# Patient Record
Sex: Male | Born: 1945
Health system: Southern US, Community
[De-identification: ages and names within clinical notes are randomized; demographics above are authoritative.]

## PROBLEM LIST (undated history)

## (undated) DIAGNOSIS — E785 Hyperlipidemia, unspecified: Secondary | ICD-10-CM

## (undated) DIAGNOSIS — K219 Gastro-esophageal reflux disease without esophagitis: Secondary | ICD-10-CM

## (undated) DIAGNOSIS — N189 Chronic kidney disease, unspecified: Secondary | ICD-10-CM

## (undated) DIAGNOSIS — I10 Essential (primary) hypertension: Secondary | ICD-10-CM

## (undated) DIAGNOSIS — E669 Obesity, unspecified: Secondary | ICD-10-CM

## (undated) DIAGNOSIS — J449 Chronic obstructive pulmonary disease, unspecified: Secondary | ICD-10-CM

## (undated) HISTORY — DX: Gastro-esophageal reflux disease without esophagitis: K21.9

## (undated) HISTORY — DX: Chronic kidney disease, unspecified: N18.9

## (undated) HISTORY — DX: Hyperlipidemia, unspecified: E78.5

## (undated) HISTORY — DX: Obesity, unspecified: E66.9

## (undated) HISTORY — DX: Essential (primary) hypertension: I10

---
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
## 2000-12-03 ENCOUNTER — Encounter: Payer: Self-pay | Admitting: Gastroenterology

## 2002-05-23 ENCOUNTER — Encounter: Payer: Self-pay | Admitting: Emergency Medicine

## 2002-05-23 ENCOUNTER — Inpatient Hospital Stay (HOSPITAL_COMMUNITY): Admission: AC | Admit: 2002-05-23 | Discharge: 2002-05-25 | Payer: Self-pay

## 2004-09-26 ENCOUNTER — Ambulatory Visit: Payer: Self-pay | Admitting: Internal Medicine

## 2004-10-05 ENCOUNTER — Ambulatory Visit: Payer: Self-pay | Admitting: Internal Medicine

## 2005-10-01 ENCOUNTER — Ambulatory Visit: Payer: Self-pay | Admitting: Internal Medicine

## 2005-10-08 ENCOUNTER — Ambulatory Visit: Payer: Self-pay | Admitting: Internal Medicine

## 2006-10-08 ENCOUNTER — Ambulatory Visit: Payer: Self-pay | Admitting: Internal Medicine

## 2006-10-08 LAB — CONVERTED CEMR LAB
ALT: 23 units/L (ref 0–40)
AST: 22 units/L (ref 0–37)
Albumin: 3.8 g/dL (ref 3.5–5.2)
Alkaline Phosphatase: 83 units/L (ref 39–117)
BUN: 10 mg/dL (ref 6–23)
Basophils Absolute: 0 10*3/uL (ref 0.0–0.1)
Basophils Relative: 0.1 % (ref 0.0–1.0)
Bilirubin Urine: NEGATIVE
Bilirubin, Direct: 0.2 mg/dL (ref 0.0–0.3)
CO2: 31 meq/L (ref 19–32)
Calcium: 9 mg/dL (ref 8.4–10.5)
Chloride: 107 meq/L (ref 96–112)
Cholesterol: 139 mg/dL (ref 0–200)
Creatinine, Ser: 0.9 mg/dL (ref 0.4–1.5)
Eosinophils Absolute: 0.5 10*3/uL (ref 0.0–0.6)
Eosinophils Relative: 9 % — ABNORMAL HIGH (ref 0.0–5.0)
GFR calc Af Amer: 110 mL/min
GFR calc non Af Amer: 91 mL/min
Glucose, Bld: 113 mg/dL — ABNORMAL HIGH (ref 70–99)
HCT: 43.6 % (ref 39.0–52.0)
HDL: 47.3 mg/dL (ref >39.0)
Hemoglobin, Urine: NEGATIVE
Hemoglobin: 15.7 g/dL (ref 13.0–17.0)
Ketones, ur: NEGATIVE mg/dL
LDL Cholesterol: 80 mg/dL (ref 0–99)
Leukocytes, UA: NEGATIVE
Lymphocytes Relative: 35.9 % (ref 12.0–46.0)
MCHC: 36.1 g/dL — ABNORMAL HIGH (ref 30.0–36.0)
MCV: 92.6 fL (ref 78.0–100.0)
Monocytes Absolute: 0.5 10*3/uL (ref 0.2–0.7)
Monocytes Relative: 9.4 % (ref 3.0–11.0)
Neutro Abs: 2.5 10*3/uL (ref 1.4–7.7)
Neutrophils Relative %: 45.6 % (ref 43.0–77.0)
Nitrite: NEGATIVE
PSA: 0.9 ng/mL (ref 0.10–4.00)
Platelets: 255 10*3/uL (ref 150–400)
Potassium: 4.3 meq/L (ref 3.5–5.1)
RBC: 4.71 M/uL (ref 4.22–5.81)
RDW: 12.3 % (ref 11.5–14.6)
Sodium: 143 meq/L (ref 135–145)
Specific Gravity, Urine: >=1.03 (ref 1.000–1.03)
TSH: 1.33 microintl units/mL (ref 0.35–5.50)
Total Bilirubin: 0.8 mg/dL (ref 0.3–1.2)
Total CHOL/HDL Ratio: 2.9
Total Protein, Urine: NEGATIVE mg/dL
Total Protein: 6.7 g/dL (ref 6.0–8.3)
Triglycerides: 57 mg/dL (ref 0–149)
Urine Glucose: NEGATIVE mg/dL
Urobilinogen, UA: 0.2 (ref 0.0–1.0)
VLDL: 11 mg/dL (ref 0–40)
WBC: 5.4 10*3/uL (ref 4.5–10.5)
pH: 6 (ref 5.0–8.0)

## 2006-10-15 ENCOUNTER — Ambulatory Visit: Payer: Self-pay | Admitting: Internal Medicine

## 2007-10-27 ENCOUNTER — Ambulatory Visit: Payer: Self-pay | Admitting: Internal Medicine

## 2007-10-28 LAB — CONVERTED CEMR LAB
ALT: 28 units/L (ref 0–53)
AST: 28 units/L (ref 0–37)
Albumin: 3.9 g/dL (ref 3.5–5.2)
Alkaline Phosphatase: 81 units/L (ref 39–117)
BUN: 16 mg/dL (ref 6–23)
Basophils Absolute: 0 10*3/uL (ref 0.0–0.1)
Basophils Relative: 0.5 % (ref 0.0–1.0)
Bilirubin Urine: NEGATIVE
Bilirubin, Direct: 0.1 mg/dL (ref 0.0–0.3)
CO2: 27 meq/L (ref 19–32)
Calcium: 9.2 mg/dL (ref 8.4–10.5)
Chloride: 103 meq/L (ref 96–112)
Cholesterol: 166 mg/dL (ref 0–200)
Creatinine, Ser: 0.9 mg/dL (ref 0.4–1.5)
Eosinophils Absolute: 0.2 10*3/uL (ref 0.0–0.7)
Eosinophils Relative: 3.2 % (ref 0.0–5.0)
GFR calc Af Amer: 110 mL/min
GFR calc non Af Amer: 91 mL/min
Glucose, Bld: 123 mg/dL — ABNORMAL HIGH (ref 70–99)
HCT: 47.6 % (ref 39.0–52.0)
HDL: 59.7 mg/dL (ref >39.0)
Hemoglobin, Urine: NEGATIVE
Hemoglobin: 16 g/dL (ref 13.0–17.0)
Ketones, ur: NEGATIVE mg/dL
LDL Cholesterol: 93 mg/dL (ref 0–99)
Leukocytes, UA: NEGATIVE
Lymphocytes Relative: 23.5 % (ref 12.0–46.0)
MCHC: 33.6 g/dL (ref 30.0–36.0)
MCV: 90.1 fL (ref 78.0–100.0)
Monocytes Absolute: 0.7 10*3/uL (ref 0.1–1.0)
Monocytes Relative: 9.4 % (ref 3.0–12.0)
Neutro Abs: 4.6 10*3/uL (ref 1.4–7.7)
Neutrophils Relative %: 63.4 % (ref 43.0–77.0)
Nitrite: NEGATIVE
PSA: 0.75 ng/mL (ref 0.10–4.00)
Platelets: 246 10*3/uL (ref 150–400)
Potassium: 4.1 meq/L (ref 3.5–5.1)
RBC: 5.28 M/uL (ref 4.22–5.81)
RDW: 12.6 % (ref 11.5–14.6)
Sodium: 138 meq/L (ref 135–145)
Specific Gravity, Urine: 1.025 (ref 1.000–1.03)
TSH: 1.59 microintl units/mL (ref 0.35–5.50)
Total Bilirubin: 1.2 mg/dL (ref 0.3–1.2)
Total CHOL/HDL Ratio: 2.8
Total Protein, Urine: NEGATIVE mg/dL
Total Protein: 6.7 g/dL (ref 6.0–8.3)
Triglycerides: 65 mg/dL (ref 0–149)
Urine Glucose: NEGATIVE mg/dL
Urobilinogen, UA: 0.2 (ref 0.0–1.0)
VLDL: 13 mg/dL (ref 0–40)
WBC: 7.2 10*3/uL (ref 4.5–10.5)
pH: 6 (ref 5.0–8.0)

## 2007-11-02 ENCOUNTER — Ambulatory Visit: Payer: Self-pay | Admitting: Internal Medicine

## 2007-11-02 DIAGNOSIS — E785 Hyperlipidemia, unspecified: Secondary | ICD-10-CM | POA: Insufficient documentation

## 2007-11-02 DIAGNOSIS — K219 Gastro-esophageal reflux disease without esophagitis: Secondary | ICD-10-CM | POA: Insufficient documentation

## 2008-10-28 ENCOUNTER — Ambulatory Visit: Payer: Self-pay | Admitting: Internal Medicine

## 2008-10-28 LAB — CONVERTED CEMR LAB
ALT: 29 units/L (ref 0–53)
AST: 25 units/L (ref 0–37)
Albumin: 3.8 g/dL (ref 3.5–5.2)
Alkaline Phosphatase: 96 units/L (ref 39–117)
BUN: 16 mg/dL (ref 6–23)
Basophils Absolute: 0 10*3/uL (ref 0.0–0.1)
Basophils Relative: 0.5 % (ref 0.0–3.0)
Bilirubin Urine: NEGATIVE
Bilirubin, Direct: 0.2 mg/dL (ref 0.0–0.3)
CO2: 29 meq/L (ref 19–32)
Calcium: 9 mg/dL (ref 8.4–10.5)
Chloride: 108 meq/L (ref 96–112)
Cholesterol: 139 mg/dL (ref 0–200)
Creatinine, Ser: 0.8 mg/dL (ref 0.4–1.5)
Eosinophils Absolute: 0.3 10*3/uL (ref 0.0–0.7)
Eosinophils Relative: 5.4 % — ABNORMAL HIGH (ref 0.0–5.0)
GFR calc non Af Amer: 103.29 mL/min (ref >60)
Glucose, Bld: 119 mg/dL — ABNORMAL HIGH (ref 70–99)
HCT: 45.7 % (ref 39.0–52.0)
HDL: 68.1 mg/dL (ref >39.00)
Hemoglobin: 15.8 g/dL (ref 13.0–17.0)
Ketones, ur: NEGATIVE mg/dL
LDL Cholesterol: 60 mg/dL (ref 0–99)
Leukocytes, UA: NEGATIVE
Lymphocytes Relative: 29.8 % (ref 12.0–46.0)
Lymphs Abs: 1.7 10*3/uL (ref 0.7–4.0)
MCHC: 34.6 g/dL (ref 30.0–36.0)
MCV: 89.1 fL (ref 78.0–100.0)
Monocytes Absolute: 0.6 10*3/uL (ref 0.1–1.0)
Monocytes Relative: 10.7 % (ref 3.0–12.0)
Neutro Abs: 3.2 10*3/uL (ref 1.4–7.7)
Neutrophils Relative %: 53.6 % (ref 43.0–77.0)
Nitrite: NEGATIVE
PSA: 0.93 ng/mL (ref 0.10–4.00)
Platelets: 222 10*3/uL (ref 150.0–400.0)
Potassium: 4.1 meq/L (ref 3.5–5.1)
RBC: 5.13 M/uL (ref 4.22–5.81)
RDW: 12.4 % (ref 11.5–14.6)
Sodium: 141 meq/L (ref 135–145)
Specific Gravity, Urine: 1.025 (ref 1.000–1.030)
TSH: 1.6 microintl units/mL (ref 0.35–5.50)
Total Bilirubin: 0.8 mg/dL (ref 0.3–1.2)
Total CHOL/HDL Ratio: 2
Total Protein, Urine: NEGATIVE mg/dL
Total Protein: 6.6 g/dL (ref 6.0–8.3)
Triglycerides: 55 mg/dL (ref 0.0–149.0)
Urine Glucose: NEGATIVE mg/dL
Urobilinogen, UA: 0.2 (ref 0.0–1.0)
VLDL: 11 mg/dL (ref 0.0–40.0)
WBC: 5.8 10*3/uL (ref 4.5–10.5)
pH: 6 (ref 5.0–8.0)

## 2008-11-08 ENCOUNTER — Ambulatory Visit: Payer: Self-pay | Admitting: Internal Medicine

## 2008-12-13 ENCOUNTER — Telehealth: Payer: Self-pay | Admitting: Internal Medicine

## 2008-12-23 ENCOUNTER — Telehealth: Payer: Self-pay | Admitting: Internal Medicine

## 2008-12-23 ENCOUNTER — Ambulatory Visit: Payer: Self-pay | Admitting: Internal Medicine

## 2008-12-23 DIAGNOSIS — R21 Rash and other nonspecific skin eruption: Secondary | ICD-10-CM | POA: Insufficient documentation

## 2009-11-07 ENCOUNTER — Ambulatory Visit: Payer: Self-pay | Admitting: Internal Medicine

## 2009-11-07 LAB — CONVERTED CEMR LAB
ALT: 33 units/L (ref 0–53)
AST: 36 units/L (ref 0–37)
Albumin: 3.8 g/dL (ref 3.5–5.2)
Alkaline Phosphatase: 87 units/L (ref 39–117)
BUN: 16 mg/dL (ref 6–23)
Basophils Absolute: 0.1 10*3/uL (ref 0.0–0.1)
Basophils Relative: 0.9 % (ref 0.0–3.0)
Bilirubin Urine: NEGATIVE
Bilirubin, Direct: 0.1 mg/dL (ref 0.0–0.3)
CO2: 30 meq/L (ref 19–32)
Calcium: 8.9 mg/dL (ref 8.4–10.5)
Chloride: 107 meq/L (ref 96–112)
Cholesterol: 145 mg/dL (ref 0–200)
Creatinine, Ser: 0.8 mg/dL (ref 0.4–1.5)
Eosinophils Absolute: 0.4 10*3/uL (ref 0.0–0.7)
Eosinophils Relative: 6.4 % — ABNORMAL HIGH (ref 0.0–5.0)
GFR calc non Af Amer: 106.01 mL/min (ref >60)
Glucose, Bld: 97 mg/dL (ref 70–99)
HCT: 46.9 % (ref 39.0–52.0)
HDL: 69.3 mg/dL (ref >39.00)
Hemoglobin: 15.9 g/dL (ref 13.0–17.0)
Ketones, ur: NEGATIVE mg/dL
LDL Cholesterol: 64 mg/dL (ref 0–99)
Leukocytes, UA: NEGATIVE
Lymphocytes Relative: 26.4 % (ref 12.0–46.0)
Lymphs Abs: 1.6 10*3/uL (ref 0.7–4.0)
MCHC: 34 g/dL (ref 30.0–36.0)
MCV: 90.1 fL (ref 78.0–100.0)
Monocytes Absolute: 0.7 10*3/uL (ref 0.1–1.0)
Monocytes Relative: 11.6 % (ref 3.0–12.0)
Neutro Abs: 3.4 10*3/uL (ref 1.4–7.7)
Neutrophils Relative %: 54.7 % (ref 43.0–77.0)
Nitrite: NEGATIVE
PSA: 1.01 ng/mL (ref 0.10–4.00)
Platelets: 244 10*3/uL (ref 150.0–400.0)
Potassium: 4.6 meq/L (ref 3.5–5.1)
RBC: 5.21 M/uL (ref 4.22–5.81)
RDW: 13.5 % (ref 11.5–14.6)
Sodium: 142 meq/L (ref 135–145)
Specific Gravity, Urine: >=1.03 (ref 1.000–1.030)
TSH: 1.51 microintl units/mL (ref 0.35–5.50)
Total Bilirubin: 0.7 mg/dL (ref 0.3–1.2)
Total CHOL/HDL Ratio: 2
Total Protein, Urine: NEGATIVE mg/dL
Total Protein: 6.4 g/dL (ref 6.0–8.3)
Triglycerides: 60 mg/dL (ref 0.0–149.0)
Urine Glucose: NEGATIVE mg/dL
Urobilinogen, UA: 0.2 (ref 0.0–1.0)
VLDL: 12 mg/dL (ref 0.0–40.0)
WBC: 6.2 10*3/uL (ref 4.5–10.5)
pH: 6 (ref 5.0–8.0)

## 2009-11-13 ENCOUNTER — Ambulatory Visit: Payer: Self-pay | Admitting: Internal Medicine

## 2009-11-13 DIAGNOSIS — R0602 Shortness of breath: Secondary | ICD-10-CM | POA: Insufficient documentation

## 2009-11-20 ENCOUNTER — Ambulatory Visit: Payer: Self-pay | Admitting: Internal Medicine

## 2009-11-21 ENCOUNTER — Encounter: Payer: Self-pay | Admitting: Internal Medicine

## 2009-11-30 ENCOUNTER — Telehealth: Payer: Self-pay | Admitting: Gastroenterology

## 2009-12-29 ENCOUNTER — Telehealth: Payer: Self-pay | Admitting: Internal Medicine

## 2010-06-28 NOTE — Procedures (Signed)
Summary: COLONOSCOPY   Colonoscopy  Procedure date:  12/03/2000  Findings:      Location:  [facility].     Patient Name: [name], [name] MRN:  Procedure Procedures: Colonoscopy CPT: 78295.  Personnel: Endoscopist: Barbette Hair. Arlyce Dice, MD.  Referred By: Linda Hedges Plotnikov, MD.  Exam Location: Exam performed in Outpatient Clinic. Outpatient  Patient Consent: Procedure, Alternatives, Risks and Benefits discussed, consent obtained, from patient.  Indications Symptoms: Hematochezia.  History  Pre-Exam Physical: Performed Dec 03, 2000. Cardio-pulmonary exam, Rectal exam, HEENT exam , Abdominal exam, Extremity exam, Neurological exam, Mental status exam WNL.  Exam Exam: Extent of exam reached: Cecum, extent intended: Cecum.  ASA Classification: II. Tolerance: good.  Monitoring: Pulse and BP monitoring, Oximetry used. Supplemental O2 given.  Colon Prep Used Fleets Prep Kit for colon prep. Prep results: fair, adequate exam.  Sedation Meds: Patient assessed and found to be appropriate for moderate (conscious) sedation. Fentanyl 100 mcg. Versed 5 mg.  Findings HEMORRHOIDS: Internal. ICD9: Hemorrhoids, Internal: 455.0.   Assessment Abnormal examination, see findings above.  Diagnoses: 455.0: Hemorrhoids, Internal.   Events  Unplanned Interventions: No intervention was required.  Unplanned Events: There were no complications. Plans Medication Plan: Hemorrhoidal Medications: Anusol Suppositories 1 pr prn, starting Dec 03, 2000   Disposition: After procedure patient sent to recovery. After recovery patient sent home.  Scheduling/Referral: Follow-Up prn.    CC: Linda Hedges.Plotnikov,MD  This report was created from the original endoscopy report, which was reviewed and signed by the above listed endoscopist.

## 2010-06-28 NOTE — Progress Notes (Signed)
Summary: recall COL?  Phone Note Call from Patient Call back at Work Phone [phone]   Caller: Patient Call For: Dr. Arlyce Dice Reason for Call: Talk to Nurse Summary of Call: would like to know when he should have recall COL... last COL was July 2002... not having any GI concerns, issues, complaints Initial call taken by: Vallarie Mare,  November 30, 2009 9:33 AM  Follow-up for Phone Call        DR.KAPLAN-Please advise. Follow-up by: Laureen Ochs LPN,  November 30, 1476 10:01 AM  Additional Follow-up for Phone Call Additional follow up Details #1::        colo in 1 year Additional Follow-up by: Louis Meckel MD,  November 30, 2009 10:17 AM    Additional Follow-up for Phone Call Additional follow up Details #2::    Message left for pt. with above MD instructions. Recall is in IDX for 11/2010. Pt. instructed to call back as needed.  Follow-up by: Laureen Ochs LPN,  December 01, 2954 10:31 AM

## 2010-06-28 NOTE — Miscellaneous (Signed)
Summary: colon  Clinical Lists Changes  Orders: Added new Referral order of Gastroenterology Referral (GI) - Signed

## 2010-06-28 NOTE — Progress Notes (Signed)
Summary: Sleeping Med  Phone Note Call from Patient Call back at [facility] Phone [phone] Call back at [phone]   Caller: Wife Summary of Call: Patient is requesting rx for "light" sleeping pill. He is having trouble sleeping.  Initial call taken by: Lamar Sprinkles, CMA,  December 29, 2009 10:47 AM  Follow-up for Phone Call        Try OTC Valerian root 1 at hs prn Follow-up by: Tresa Garter MD,  January 01, 2010 8:03 AM  Additional Follow-up for Phone Call Additional follow up Details #1::        left mess to call office back....................Marland KitchenLamar Sprinkles, CMA  January 01, 2010 2:24 PM   Pt informed  Additional Follow-up by: Lamar Sprinkles, CMA,  January 02, 2010 5:21 PM

## 2010-06-28 NOTE — Assessment & Plan Note (Signed)
Summary: per ami sched--avp--zostavax-stc  Nurse Visit   Allergies: No Known Drug Allergies  Immunizations Administered:  Zostavax # 1:    Vaccine Type: Zostavax    Site: left arm    Mfr: Merck    Dose: 0.65    Route: Joffre    Given by: Lamar Sprinkles, CMA    Exp. Date: 11/03/2010    Lot #: [id number]    VIS given: 03/08/05 given November 20, 2009.  Orders Added: 1)  Zoster (Shingles) Vaccine Live [90736] 2)  Admin 1st Vaccine [phone]

## 2010-06-28 NOTE — Assessment & Plan Note (Signed)
Summary: CPX / NWS  #   Vital Signs:  Patient profile:   65 year old male Height:      64 inches (162.56 cm) Weight:      185 pounds (84.09 kg) BMI:     31.87 O2 Sat:      94 % on Room air Temp:     97.4 degrees F (36.33 degrees C) oral Pulse rate:   72 / minute BP sitting:   150 / 78  (left arm) Cuff size:   regular  Vitals Entered By: Lucious Groves (November 13, 2009 3:01 PM)  O2 Flow:  Room air CC: CPX./kb Is Patient Diabetic? No Pain Assessment Patient in pain? no      Comments Patient notes that he is not taking Acetazolamide, Lotrisone, or Prednisone./kb   CC:  CPX./kb.  History of Present Illness: The patient presents for a wellness examination  BP OK at home C/o SOB going up the hill x long time  Preventive Screening-Counseling & Management  Caffeine-Diet-Exercise     Does Patient Exercise: yes  Current Medications (verified): 1)  Lipitor 20 Mg  Tabs (Atorvastatin Calcium) .... Once Daily 2)  Nexium 40 Mg  Cpdr (Esomeprazole Magnesium) .... Once Daily 3)  Folic Acid 1 Mg  Tabs (Folic Acid) .... 2 Po Once Daily 4)  Aspirin 81 Mg  Tbec (Aspirin) .... One By Mouth Every Day 5)  Vitamin D 1000 Unit Tabs (Cholecalciferol) .... Once Daily 6)  Multivitamins  Tabs (Multiple Vitamin) .... Once Daily 7)  Acetazolamide 250 Mg Tabs (Acetazolamide) .[name].. 1 By Mouth Two Times A Day X 9 D 8)  Lotrisone 1-0.05 % Crea (Clotrimazole-Betamethasone) .... Use Asd Two Times A Day As Needed 9)  Prednisone 10 Mg Tabs (Prednisone) .... 3po Qd For 3days, Then 2po Qd For 3days, Then 1po Qd For 3days, Then Stop  Allergies (verified): No Known Drug Allergies  Past History:  Past Medical History: Last updated: 11/02/2007 GERD Hyperlipidemia Colon 2002  Dr Arlyce Dice  Family History: Last updated: 11/02/2007 Family History Hypertension CAD  Social History: Last updated: 11/13/2009 Occupation: Married Former Smoker Regular exercise-yes: riding  Past Surgical History: Denies  surgical history  Social History: Occupation: Married Former Smoker Regular exercise-yes: riding Does Patient Exercise:  yes  Review of Systems       The patient complains of dyspnea on exertion.  The patient denies anorexia, fever, weight loss, weight gain, vision loss, decreased hearing, hoarseness, chest pain, syncope, peripheral edema, prolonged cough, headaches, hemoptysis, abdominal pain, melena, hematochezia, severe indigestion/heartburn, hematuria, incontinence, genital sores, muscle weakness, suspicious skin lesions, transient blindness, difficulty walking, depression, unusual weight change, abnormal bleeding, enlarged lymph nodes, angioedema, and testicular masses.         Mild DOE  Physical Exam  General:  alert and overweight-appearing.   Head:  normocephalic and atraumatic.   Eyes:  vision grossly intact, pupils equal, and pupils round.   Ears:  R ear normal and L ear normal.   Nose:  no external deformity and no nasal discharge.   Mouth:  no gingival abnormalities and pharynx pink and moist.   Neck:  supple and no masses.   Chest Wall:  No deformities, masses, tenderness or gynecomastia noted. Breasts:  No masses or gynecomastia noted Lungs:  Normal respiratory effort, chest expands symmetrically. Lungs are clear to auscultation, no crackles or wheezes. Heart:  Normal rate and regular rhythm. S1 and S2 normal without gallop, murmur, click, rub or other extra sounds. Abdomen:  Bowel sounds positive,abdomen soft and non-tender without masses, organomegaly or hernias noted. Rectal:  No external abnormalities noted. Normal sphincter tone. No rectal masses or tenderness. Genitalia:  Testes bilaterally descended without nodularity, tenderness or masses. No scrotal masses or lesions. No penis lesions or urethral discharge. Prostate:  Prostate gland firm and smooth, no enlargement, nodularity, tenderness, mass, asymmetry or induration. Msk:  No deformity or scoliosis noted of  thoracic or lumbar spine.   Pulses:  R and L carotid,radial,femoral,dorsalis pedis and posterior tibial pulses are full and equal bilaterally Extremities:  No clubbing, cyanosis, edema, or deformity noted with normal full range of motion of all joints.   Neurologic:  No cranial nerve deficits noted. Station and gait are normal. Plantar reflexes are down-going bilaterally. DTRs are symmetrical throughout. Sensory, motor and coordinative functions appear intact. Skin:  Intact without suspicious lesions or rashes Cervical Nodes:  No lymphadenopathy noted Inguinal Nodes:  No significant adenopathy Psych:  Cognition and judgment appear intact. Alert and cooperative with normal attention span and concentration. No apparent delusions, illusions, hallucinations   Impression & Recommendations:  Problem # 1:  WELL ADULT EXAM (ICD-V70.0) Assessment New Health and age related issues were discussed. Available screening tests and vaccinations were discussed as well. Healthy life style including good diet and execise was discussed.  The labs were reviewed with the patient.  Orders: EKG w/ Interpretation (93000) TD Toxoids IM 7 YR + (40981) Pneumococcal Vaccine (19147) Admin 1st Vaccine (82956) Admin of Any Addtl Vaccine (21308) Admin 1st Vaccine (State) [phone] Admin of Any Addtl Vaccine (State) (90472S) T-2 View CXR, Same Day (71020.5TC) Zostavax Info  Problem # 2:  DYSPNEA (ICD-786.05) poss COPD related Try MDI Orders: T-2 View CXR, Same Day (71020.5TC)  Problem # 3:  HYPERLIPIDEMIA (ICD-272.4) Assessment: Improved  His updated medication list for this problem includes:    Lipitor 20 Mg Tabs (Atorvastatin calcium) ..... Once daily  Problem # 4:  OTHER ABNORMAL GLUCOSE (ICD-790.29) Assessment: Unchanged The labs were reviewed with the patient.  See "Patient Instructions".   Complete Medication List: 1)  Lipitor 20 Mg Tabs (Atorvastatin calcium) .... Once daily 2)  Nexium 40 Mg Cpdr  (Esomeprazole magnesium) .... Once daily 3)  Folic Acid 1 Mg Tabs (Folic acid) .... 2 po once daily 4)  Aspirin 81 Mg Tbec (Aspirin) .... One by mouth every day 5)  Vitamin D 1000 Unit Tabs (Cholecalciferol) .... Once daily 6)  Symbicort 80-4.5 Mcg/act Aero (Budesonide-formoterol fumarate) .[name].. 1-2 inh two times a day  Patient Instructions: 1)  Please schedule a follow-up appointment in 1 year well w/labs. 2)  It is important that you exercise regularly at least 20 minutes 5 times a week. If you develop chest pain, have severe difficulty breathing, or feel very tired , stop exercising immediately and seek medical attention. 3)  You need to lose weight. Consider a lower calorie diet and regular exercise.  Prescriptions: SYMBICORT 80-4.5 MCG/ACT AERO (BUDESONIDE-FORMOTEROL FUMARATE) 1-2 inh two times a day  #1 x 12   Entered and Authorized by:   Tresa Garter MD   Signed by:   Tresa Garter MD on 11/13/2009   Method used:   Print then Give to Patient   RxID:   [id number] FOLIC ACID 1 MG  TABS (FOLIC ACID) 2 po once daily  #60 x 11   Entered and Authorized by:   Tresa Garter MD   Signed by:   Tresa Garter MD on 11/13/2009   Method  used:   Print then Give to Patient   RxID:   [id number] NEXIUM 40 MG  CPDR (ESOMEPRAZOLE MAGNESIUM) once daily  #30 x 11   Entered and Authorized by:   Tresa Garter MD   Signed by:   Tresa Garter MD on 11/13/2009   Method used:   Print then Give to Patient   RxID:   [id number] LIPITOR 20 MG  TABS (ATORVASTATIN CALCIUM) once daily  #30 x 11   Entered and Authorized by:   Tresa Garter MD   Signed by:   Tresa Garter MD on 11/13/2009   Method used:   Print then Give to Patient   RxID:   [id number] SYMBICORT 80-4.5 MCG/ACT AERO (BUDESONIDE-FORMOTEROL FUMARATE) 1-2 inh two times a day  #1 x 12   Entered and Authorized by:   Tresa Garter MD   Signed by:   Tresa Garter MD on  11/13/2009   Method used:   Print then Give to Patient   RxID:   [id number]    Tetanus/Td Vaccine    Vaccine Type: Td    Site: left deltoid    Mfr: Sanofi Pasteur    Dose: 0.5 ml    Route: IM    Given by: Lucious Groves    Exp. Date: 06/28/2011    Lot #: [id number]    VIS given: 04/14/07 version given November 13, 2009.  Pneumovax Vaccine    Vaccine Type: Pneumovax    Site: left deltoid    Mfr: Merck    Dose: 0.5 ml    Route: IM    Given by: Lucious Groves    Exp. Date: 03/21/2011    Lot #: [id number]    VIS given: 12/23/95 version given November 13, 2009.

## 2010-08-29 ENCOUNTER — Other Ambulatory Visit (INDEPENDENT_AMBULATORY_CARE_PROVIDER_SITE_OTHER): Payer: 59

## 2010-08-29 DIAGNOSIS — Z0000 Encounter for general adult medical examination without abnormal findings: Secondary | ICD-10-CM

## 2010-08-29 DIAGNOSIS — Z0389 Encounter for observation for other suspected diseases and conditions ruled out: Secondary | ICD-10-CM

## 2010-08-29 LAB — BASIC METABOLIC PANEL
BUN: 16 mg/dL (ref 6–23)
CO2: 29 mEq/L (ref 19–32)
Chloride: 103 mEq/L (ref 96–112)
Glucose, Bld: 116 mg/dL — ABNORMAL HIGH (ref 70–99)
Potassium: 4.8 mEq/L (ref 3.5–5.1)

## 2010-08-29 LAB — URINALYSIS, ROUTINE W REFLEX MICROSCOPIC
Bilirubin Urine: NEGATIVE
Ketones, ur: NEGATIVE
Leukocytes, UA: NEGATIVE
Nitrite: NEGATIVE
Specific Gravity, Urine: 1.025 (ref 1.000–1.030)
Urobilinogen, UA: 0.2 (ref 0.0–1.0)
pH: 6 (ref 5.0–8.0)

## 2010-08-29 LAB — CBC WITH DIFFERENTIAL/PLATELET
Basophils Relative: 0.5 % (ref 0.0–3.0)
Eosinophils Relative: 5.1 % — ABNORMAL HIGH (ref 0.0–5.0)
Hemoglobin: 15.9 g/dL (ref 13.0–17.0)
Lymphocytes Relative: 25.8 % (ref 12.0–46.0)
MCV: 93 fl (ref 78.0–100.0)
Neutro Abs: 4.1 10*3/uL (ref 1.4–7.7)
Neutrophils Relative %: 59.4 % (ref 43.0–77.0)
RBC: 4.89 Mil/uL (ref 4.22–5.81)
WBC: 7 10*3/uL (ref 4.5–10.5)

## 2010-08-29 LAB — HEPATIC FUNCTION PANEL
ALT: 25 U/L (ref 0–53)
Bilirubin, Direct: 0.2 mg/dL (ref 0.0–0.3)
Total Bilirubin: 1.2 mg/dL (ref 0.3–1.2)
Total Protein: 6.3 g/dL (ref 6.0–8.3)

## 2010-08-29 LAB — TSH: TSH: 1.3 u[IU]/mL (ref 0.35–5.50)

## 2010-08-29 LAB — LIPID PANEL
LDL Cholesterol: 64 mg/dL (ref 0–99)
Total CHOL/HDL Ratio: 2
VLDL: 11.6 mg/dL (ref 0.0–40.0)

## 2010-08-29 LAB — PSA: PSA: 1.33 ng/mL (ref 0.10–4.00)

## 2010-09-05 ENCOUNTER — Encounter: Payer: Self-pay | Admitting: Internal Medicine

## 2010-09-05 ENCOUNTER — Ambulatory Visit (INDEPENDENT_AMBULATORY_CARE_PROVIDER_SITE_OTHER): Payer: 59 | Admitting: Internal Medicine

## 2010-09-05 VITALS — BP 150/90 | HR 84 | Temp 98.3°F | Resp 16 | Ht 63.0 in | Wt 184.0 lb

## 2010-09-05 DIAGNOSIS — Z0000 Encounter for general adult medical examination without abnormal findings: Secondary | ICD-10-CM

## 2010-09-05 MED ORDER — ATORVASTATIN CALCIUM 20 MG PO TABS: 20.0000 mg | ORAL_TABLET | Freq: Every day | ORAL | Status: DC

## 2010-09-05 MED ORDER — DOXYCYCLINE HYCLATE 50 MG PO CAPS: 50.0000 mg | ORAL_CAPSULE | Freq: Two times a day (BID) | ORAL | Status: AC

## 2010-09-05 MED ORDER — TRIAMCINOLONE ACETONIDE 0.5 % EX CREA: TOPICAL_CREAM | Freq: Three times a day (TID) | CUTANEOUS | Status: DC

## 2010-09-05 MED ORDER — FOLIC ACID 1 MG PO TABS: 2.0000 mg | ORAL_TABLET | Freq: Every day | ORAL | Status: DC

## 2010-09-05 MED ORDER — ALBUTEROL SULFATE HFA 108 (90 BASE) MCG/ACT IN AERS: 2.0000 | INHALATION_SPRAY | Freq: Four times a day (QID) | RESPIRATORY_TRACT | Status: DC | PRN

## 2010-09-05 MED ORDER — ESOMEPRAZOLE MAGNESIUM 40 MG PO CPDR: 40.0000 mg | DELAYED_RELEASE_CAPSULE | Freq: Every day | ORAL | Status: DC

## 2010-09-05 NOTE — Progress Notes (Signed)
Subjective:    Patient ID: [name], male    DOB: 06-05-1945, 65 y.o.   MRN: [id number]  HPI  The patient is here for a wellness exam. The patient has been doing well overall without major physical or psychological issues going on lately. The patient needs to address her chronic hypertension that has been well controlled with medicines; to address chronic  hyperlipidemia controlled with medicines as well; and to address GERD, controlled with medical treatment and diet. C/o 2 itchy spots on penis appeared after riding 1 wk ago.   Review of Systems  Constitutional: Negative for appetite change, fatigue and unexpected weight change.  HENT: Negative for nosebleeds, congestion, sore throat, sneezing, trouble swallowing and neck pain.   Eyes: Negative for itching and visual disturbance.  Respiratory: Negative for cough.   Cardiovascular: Negative for chest pain, palpitations and leg swelling.  Gastrointestinal: Negative for nausea, diarrhea, blood in stool and abdominal distention.  Genitourinary: Negative for frequency and hematuria.  Musculoskeletal: Negative for back pain, joint swelling and gait problem.  Skin: Negative for rash.  Neurological: Negative for dizziness, tremors, speech difficulty and weakness.  Psychiatric/Behavioral: Negative for sleep disturbance, dysphoric mood and agitation. The patient is not nervous/anxious.        Objective:   Physical Exam  Constitutional: He is oriented to person, place, and time. He appears well-developed and well-nourished. No distress.  HENT:  Head: Normocephalic and atraumatic.  Right Ear: External ear normal.  Left Ear: External ear normal.  Nose: Nose normal.  Mouth/Throat: Oropharynx is clear and moist. No oropharyngeal exudate.  Eyes: Conjunctivae and EOM are normal. Pupils are equal, round, and reactive to light. Right eye exhibits no discharge. Left eye exhibits no discharge. No scleral icterus.  Neck: Normal range of  motion. Neck supple. No JVD present. No tracheal deviation present. No thyromegaly present.  Cardiovascular: Normal rate, regular rhythm, normal heart sounds and intact distal pulses.  Exam reveals no gallop and no friction rub.   No murmur heard. Pulmonary/Chest: Effort normal and breath sounds normal. No stridor. No respiratory distress. He has no wheezes. He has no rales. He exhibits no tenderness.  Abdominal: Soft. Bowel sounds are normal. He exhibits no distension and no mass. There is no tenderness. There is no rebound and no guarding.  Genitourinary: Rectum normal, prostate normal and penis normal. Guaiac negative stool. No penile tenderness.       Two 5x3 mm eryth. NT papules on foreskins at 6 o'clock  Musculoskeletal: Normal range of motion. He exhibits no edema and no tenderness.  Lymphadenopathy:    He has no cervical adenopathy.  Neurological: He is alert and oriented to person, place, and time. He has normal reflexes. No cranial nerve deficit. He exhibits normal muscle tone. Coordination normal.  Skin: Skin is warm and dry. No rash noted. He is not diaphoretic. No erythema. No pallor.  Psychiatric: He has a normal mood and affect. His behavior is normal. Judgment and thought content normal.       Lab Results  Component Value Date   WBC 7.0 08/29/2010   HGB 15.9 08/29/2010   HCT 45.5 08/29/2010   PLT 263.0 08/29/2010   CHOL 129 08/29/2010   TRIG 58.0 08/29/2010   HDL 53.20 08/29/2010   ALT 25 08/29/2010   AST 24 08/29/2010   NA 141 08/29/2010   K 4.8 08/29/2010   CL 103 08/29/2010   CREATININE 0.9 08/29/2010   BUN 16 08/29/2010   CO2 29 08/29/2010  TSH 1.30 08/29/2010   PSA 1.33 08/29/2010      Assessment & Plan:  Wellness  Age related health issues were discussed. Colonoscopy to schedule. Labs reviewed. EKG OK  Penile lesions - nonspecific, poss. Insect bites See Meds. No h/o STD or risk factors.  HTN On meds  GERD On Meds  Dyslipidemia On meds

## 2010-10-09 NOTE — Assessment & Plan Note (Signed)
[name]                           PRIMARY CARE OFFICE NOTE   [name], [name]                     MRN:          [id number]  DATE:10/15/2006                            DOB:          August 10, 1945    The patient is a 65 year old male who presents for a wellness  examination.   PAST MEDICAL HISTORY FAMILY HISTORY SOCIAL HISTORY:  As per Oct 08, 2005, note.   CURRENT MEDICINES:  Reviewed.  He is on multivitamins and also taking  Vitamin D.   ALLERGIES:  None.   REVIEW OF SYSTEMS:  No chest pain or shortness of breath, no syncope, no  neurologic complaints, no skin troubles, no joint pains.  The rest of  the 18-point review of systems is negative.   PHYSICAL:  Blood pressure 144/91, pulse 61, temp 97.4, weight 176  pounds, unchanged.  He is in no acute distress.  Looks well.  HEENT:  With moist mucosa.  NECK:  Supple.  No thyromegaly or bruit.  LUNGS:  Clear.  No wheeze or rales.  HEART:  S1, S2.  No murmur, no gallop.  ABDOMEN:  Soft, nontender, no organomegaly or masses felt.  LOWER EXTREMITIES:  Without edema.  He is alert, oriented and cooperative.  He denies being depressed.  Normal external genitalia.  RECTAL:  Normal.  Slightly enlarged prostate.  No masses, no nodules,  stool guaiac negative.  SKIN:  Clear.  Alert, oriented and cooperative.  Not depressed.   LABS:  On Oct 10, 2006:  A CBC normal.  Glucose 113, TSA normal, TSH  normal, LFTs normal, BMET normal, cholesterol 139, HDL 47, LDL 80.   ASSESSMENT AND PLAN:  1. Normal well examination. [name]  Healthy lifestyle discussed.  He is      very physically active with horses.  Zostavax given.  Obtain chest      x-ray.  EKG looks good.  2. Elevated glucoses.  He will try to lose 5 pounds.  Low carbohydrate      diet.  Will recheck in 3-6 months.  3. Elevated blood pressure.  He states blood pressure has been normal      at home.  4. Dyslipidemia, on therapy.  5. Gastroesophageal  reflux disease, on therapy.     Georgina Quint. Plotnikov, MD  Electronically Signed    AVP/MedQ  DD: 10/23/2006  DT: 10/23/2006  Job #: [id number]
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
## 2010-10-25 ENCOUNTER — Telehealth: Payer: Self-pay | Admitting: *Deleted
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
## 2010-10-25 NOTE — Telephone Encounter (Signed)
I said that I was going to see him today after 1 pm, however, we were not able to reach him. Acute appt w/me or another MD if avail tomorrow Thx

## 2010-10-26 NOTE — Telephone Encounter (Signed)
Please set up for appt. thanks

## 2010-10-31 ENCOUNTER — Encounter: Payer: Self-pay | Admitting: Internal Medicine

## 2010-10-31 ENCOUNTER — Ambulatory Visit (INDEPENDENT_AMBULATORY_CARE_PROVIDER_SITE_OTHER): Payer: 59 | Admitting: Internal Medicine

## 2010-10-31 VITALS — BP 168/98 | HR 84 | Temp 98.0°F | Ht 64.0 in | Wt 185.0 lb

## 2010-10-31 DIAGNOSIS — R55 Syncope and collapse: Secondary | ICD-10-CM

## 2010-10-31 DIAGNOSIS — K219 Gastro-esophageal reflux disease without esophagitis: Secondary | ICD-10-CM

## 2010-10-31 DIAGNOSIS — N4889 Other specified disorders of penis: Secondary | ICD-10-CM

## 2010-10-31 DIAGNOSIS — N489 Disorder of penis, unspecified: Secondary | ICD-10-CM

## 2010-10-31 DIAGNOSIS — J45909 Unspecified asthma, uncomplicated: Secondary | ICD-10-CM

## 2010-10-31 DIAGNOSIS — R05 Cough: Secondary | ICD-10-CM

## 2010-10-31 DIAGNOSIS — R059 Cough, unspecified: Secondary | ICD-10-CM

## 2010-10-31 MED ORDER — DOXYCYCLINE HYCLATE 100 MG PO TABS: 100.0000 mg | ORAL_TABLET | Freq: Two times a day (BID) | ORAL | Status: AC

## 2010-10-31 MED ORDER — MOMETASONE FURO-FORMOTEROL FUM 200-5 MCG/ACT IN AERO: 2.0000 | INHALATION_SPRAY | Freq: Every day | RESPIRATORY_TRACT | Status: DC

## 2010-10-31 NOTE — Progress Notes (Signed)
Subjective:    Patient ID: [name], male    DOB: 20-Mar-1946, 65 y.o.   MRN: [id number]  HPI   Passed out x 3 over past 1 year after he choked on food and coughed; last 3 mo ago  Review of Systems     Objective:   Physical Exam Subjective:    Patient ID: [name], male    DOB: [birth year], 65 y.o.   MRN: [id number]  HPI   C/o: Passed out x 3 over past 1 year after he choked on food and coughed; last 3 mo ago  Needs  to address GERD, controlled with medical treatment and diet. He denies belching, chocking etc  C/o 2 itchy spots on penis reappeared after initial abx Rx 1 mo ago.   Review of Systems  Constitutional: Negative for appetite change, fatigue and unexpected weight change.  HENT: Negative for nosebleeds, congestion, sore throat, sneezing, trouble swallowing and neck pain.   Eyes: Negative for itching and visual disturbance.  Respiratory: Pos for cough.   Cardiovascular: Negative for chest pain, palpitations and leg swelling. Syncope - as above Gastrointestinal: Negative for nausea, diarrhea, blood in stool and abdominal distention.  Genitourinary: Negative for frequency and hematuria.  Musculoskeletal: Negative for back pain, joint swelling and gait problem.  Skin: Negative for rash.  Neurological: Negative for dizziness, tremors, speech difficulty and weakness.  Psychiatric/Behavioral: Negative for sleep disturbance, dysphoric mood and agitation. The patient is not nervous/anxious.        Objective:   Physical Exam  Constitutional: He is oriented to person, place, and time. He appears well-developed and well-nourished. No distress.  HENT:  Head: Normocephalic and atraumatic.  Right Ear: External ear normal.  Left Ear: External ear normal.  Nose: Nose normal.  Mouth/Throat: Oropharynx is clear and moist. No oropharyngeal exudate.  Eyes: Conjunctivae and EOM are normal. Pupils are equal, round, and reactive to light. Right eye exhibits no  discharge. Left eye exhibits no discharge. No scleral icterus.  Neck: Normal range of motion. Neck supple. No JVD present. No tracheal deviation present. No thyromegaly present.  Cardiovascular: Normal rate, regular rhythm, normal heart sounds and intact distal pulses.  Exam reveals no gallop and no friction rub.   No murmur heard. Pulmonary/Chest: Effort normal and breath sounds normal. No stridor. No respiratory distress. He has no wheezes. He has no rales. He exhibits no tenderness.  Abdominal: Soft. Bowel sounds are normal. He exhibits no distension and no mass. There is no tenderness. There is no rebound and no guarding.  Genitourinary: Rectum normal, prostate normal and penis normal. Guaiac negative stool. No penile tenderness.       Two 5x3 mm eryth. NT papules on foreskins at 6 o'clock - smaller. Musculoskeletal: Normal range of motion. He exhibits no edema and no tenderness.  Lymphadenopathy:    He has no cervical adenopathy.  Neurological: He is alert and oriented to person, place, and time. He has normal reflexes. No cranial nerve deficit. He exhibits normal muscle tone. Coordination normal.  Skin: Skin is warm and dry. No rash noted. He is not diaphoretic. No erythema. No pallor.  Psychiatric: He has a normal mood and affect. His behavior is normal. Judgment and thought content normal.       Lab Results  Component Value Date   WBC 7.0 08/29/2010   HGB 15.9 08/29/2010   HCT 45.5 08/29/2010   PLT 263.0 08/29/2010   CHOL 129 08/29/2010   TRIG 58.0 08/29/2010  HDL 53.20 08/29/2010   ALT 25 08/29/2010   AST 24 08/29/2010   NA 141 08/29/2010   K 4.8 08/29/2010   CL 103 08/29/2010   CREATININE 0.9 08/29/2010   BUN 16 08/29/2010   CO2 29 08/29/2010   TSH 1.30 08/29/2010   PSA 1.33 08/29/2010      Assessment & Plan:             Assessment & Plan:

## 2010-11-04 ENCOUNTER — Encounter: Payer: Self-pay | Admitting: Internal Medicine

## 2010-11-04 DIAGNOSIS — R55 Syncope and collapse: Secondary | ICD-10-CM | POA: Insufficient documentation

## 2010-11-04 DIAGNOSIS — J45909 Unspecified asthma, uncomplicated: Secondary | ICD-10-CM | POA: Insufficient documentation

## 2010-11-04 DIAGNOSIS — J45991 Cough variant asthma: Secondary | ICD-10-CM | POA: Insufficient documentation

## 2010-11-04 NOTE — Assessment & Plan Note (Signed)
Restart MDI on daily basis. Pulm consult

## 2010-11-04 NOTE — Assessment & Plan Note (Signed)
We likely need to eliminate coughing spells as a primary culprit. He will see Dr Sherene Sires for a consultation. He will use Dulera for now.

## 2010-11-04 NOTE — Assessment & Plan Note (Signed)
Cont w/Nexium qd °

## 2010-11-23 ENCOUNTER — Institutional Professional Consult (permissible substitution): Payer: 59 | Admitting: Internal Medicine

## 2010-11-25 ENCOUNTER — Other Ambulatory Visit: Payer: Self-pay | Admitting: Internal Medicine

## 2010-11-29 NOTE — Telephone Encounter (Signed)
Pt was seen by MD.

## 2010-12-10 ENCOUNTER — Encounter: Payer: Self-pay | Admitting: Internal Medicine

## 2010-12-10 ENCOUNTER — Ambulatory Visit (INDEPENDENT_AMBULATORY_CARE_PROVIDER_SITE_OTHER): Payer: 59 | Admitting: Internal Medicine

## 2010-12-10 ENCOUNTER — Ambulatory Visit (INDEPENDENT_AMBULATORY_CARE_PROVIDER_SITE_OTHER)
Admission: RE | Admit: 2010-12-10 | Discharge: 2010-12-10 | Disposition: A | Discharge status: Home / Self Care | Payer: 59 | Source: Ambulatory Visit | Attending: Internal Medicine | Admitting: Internal Medicine

## 2010-12-10 VITALS — BP 128/90 | HR 72 | Temp 98.0°F | Ht 64.0 in | Wt 184.8 lb

## 2010-12-10 DIAGNOSIS — J45909 Unspecified asthma, uncomplicated: Secondary | ICD-10-CM

## 2010-12-10 DIAGNOSIS — R55 Syncope and collapse: Secondary | ICD-10-CM

## 2010-12-10 DIAGNOSIS — R05 Cough: Secondary | ICD-10-CM

## 2010-12-10 DIAGNOSIS — R059 Cough, unspecified: Secondary | ICD-10-CM

## 2010-12-10 NOTE — Patient Instructions (Signed)
GERD (REFLUX)  is an extremely common cause of respiratory symptoms, many times with no significant heartburn at all.    It can be treated with medication, but also with lifestyle changes including avoidance of late meals, excessive alcohol, smoking cessation, and avoid fatty foods, chocolate, peppermint, colas, red wine, and acidic juices such as orange juice.  NO MINT OR MENTHOL PRODUCTS SO NO COUGH DROPS  USE SUGARLESS CANDY INSTEAD (jolley ranchers or Stover's)  NO OIL BASED VITAMINS   Increase dulera 200 Take 2 puffs first thing in am and then another 2 puffs about 12 hours later.   Work on inhaler technique:  relax and gently blow all the way out then take a nice smooth deep breath back in, triggering the inhaler at same time you start breathing in.  Hold for up to 5 seconds if you can.  Rinse and gargle with water when done   If your mouth or throat starts to bother you,   I suggest you time the inhaler to your dental care and after using the inhaler(s) brush teeth and tongue with a baking soda containing toothpaste and when you rinse this out, gargle with it first to see if this helps your mouth and throat.     Please schedule a follow up office visit in 6  weeks, sooner if needed with PFT's

## 2010-12-10 NOTE — Assessment & Plan Note (Signed)
Agree with Dr Posey Rea that this is most likely cough syncope and needs aggressive control of cough on this basis

## 2010-12-10 NOTE — Assessment & Plan Note (Signed)
Response to dulera strongly suggests much of his cough is due to asthmatic bronchitis.  However Symptoms are markedly disproportionate to objective findings and not clear this is a lung problem but pt does appear to have difficult airway management issues.   DDX of  difficult airways managment all start with A and  include Adherence, Ace Inhibitors, Acid Reflux, Active Sinus Disease, Alpha 1 Antitripsin deficiency, Anxiety masquerading as Airways dz,  ABPA,  allergy(esp in young), Aspiration (esp in elderly), Adverse effects of DPI,  Active smokers, plus two Bs  = Bronchiectasis and Beta blocker use..and one C= CHF  Adherence is always the initial "prime suspect" and is a multilayered concern that requires a "trust but verify" approach in every patient - starting with knowing how to use medications, especially inhalers, correctly, keeping up with refills and understanding the fundamental difference between maintenance and prns vs those medications only taken for a very short course and then stopped and not refilled.  The proper method of use, as well as anticipated side effects, of this metered-dose inhaler are discussed and demonstrated to the patient. Improved to 75% with coaching  ? Acid reflux:  Reviewed diet/ nexium rx.

## 2010-12-10 NOTE — Progress Notes (Signed)
Subjective:     Patient ID: [name], male   DOB: June 15, 1945, 65 y.o.   MRN: [id number]  HPI  52 yowm quit smoking 2003 with some coughing that improved p quit but never completely resolved.  12/10/2010 ov/Arlee Santosuosso Initial pulmonary office eval in EMR era chronic cough ("all my life", maybe started p started smoking)  some better on dulera x 2 months ,  Tends to be worse during the day and better sleeping,  No am awakening or early exac.  No problems with using voice or swallowing. Takes nexium right before bfast x many years.  Laughing hard can bring it own.only using dulera 200 now.  No purulent sputum over sinus or reflux symptoms  Pt denies any significant sore throat, dysphagia, itching, sneezing,  nasal congestion or excess/ purulent secretions,  fever, chills, sweats, unintended wt loss, pleuritic or exertional cp, hempoptysis, orthopnea pnd or leg swelling.    Also denies any obvious fluctuation of symptoms with weather or environmental changes or other aggravating or alleviating factors.  Not really sob unless coughing    Review of Systems  Constitutional: Negative for fever, chills, activity change, appetite change and unexpected weight change.  HENT: Negative for congestion, sore throat, rhinorrhea, sneezing, trouble swallowing, dental problem, voice change and postnasal drip.   Eyes: Negative for visual disturbance.  Respiratory: Positive for cough, choking and shortness of breath.   Cardiovascular: Negative for chest pain and leg swelling.  Gastrointestinal: Negative for nausea, vomiting and abdominal pain.  Genitourinary: Negative for difficulty urinating.  Musculoskeletal: Negative for arthralgias.  Skin: Negative for rash.  Psychiatric/Behavioral: Negative for behavioral problems and confusion.       Objective:   Physical Exam    very pleasant amb wm with mild pot belly contour habitus Wt 184 12/10/2010 HEENT mild turbinate edema.  Oropharynx no thrush or excess  pnd or cobblestoning.  No JVD or cervical adenopathy. Mild accessory muscle hypertrophy. Trachea midline, nl thryroid. Chest was hyperinflated by percussion with diminished breath sounds and moderate increased exp time with trace end exp cough/ wheeze. Hoover sign positive at mid inspiration. Regular rate and rhythm without murmur gallop or rub or increase P2 or edema.  Abd: no hsm, nl excursion. Ext warm without cyanosis or clubbing.   Assessment:         Plan:

## 2010-12-12 NOTE — Progress Notes (Signed)
Quick Note:  Spoke with pt and notified of results per Dr. Wert. Pt verbalized understanding and denied any questions.  ______ 

## 2011-01-24 ENCOUNTER — Encounter: Payer: Self-pay | Admitting: Gastroenterology

## 2011-01-31 ENCOUNTER — Telehealth: Payer: Self-pay | Admitting: Internal Medicine

## 2011-01-31 NOTE — Telephone Encounter (Signed)
Pt requests to be contacted at [phone] (cell) if you are unable to reach him at his work number.  Antionette Fairy

## 2011-01-31 NOTE — Telephone Encounter (Signed)
Pt should call at 8:30am on Monday morning (9/10) if he feels he cannot do the PFT and reschedule test and ov with MW. LMOM w/secretary to have pt return my call.

## 2011-01-31 NOTE — Telephone Encounter (Signed)
Pt returning call.  Please call him on his cell, [phone]. Antionette Fairy

## 2011-01-31 NOTE — Telephone Encounter (Signed)
Returning call--please call [phone]

## 2011-02-01 NOTE — Telephone Encounter (Signed)
ATC pt VM box not set up yet, [facility]

## 2011-02-01 NOTE — Telephone Encounter (Signed)
Spoke with pt. He already cancelled PFT and ov and states when feels up to it he will call back to resched.

## 2011-02-04 ENCOUNTER — Ambulatory Visit: Payer: 59 | Admitting: Internal Medicine

## 2011-02-05 ENCOUNTER — Ambulatory Visit: Payer: 59 | Admitting: Internal Medicine

## 2011-02-07 ENCOUNTER — Ambulatory Visit (INDEPENDENT_AMBULATORY_CARE_PROVIDER_SITE_OTHER): Payer: 59 | Admitting: Internal Medicine

## 2011-02-07 ENCOUNTER — Encounter: Payer: Self-pay | Admitting: Internal Medicine

## 2011-02-07 DIAGNOSIS — K219 Gastro-esophageal reflux disease without esophagitis: Secondary | ICD-10-CM

## 2011-02-07 DIAGNOSIS — R030 Elevated blood-pressure reading, without diagnosis of hypertension: Secondary | ICD-10-CM

## 2011-02-07 DIAGNOSIS — E785 Hyperlipidemia, unspecified: Secondary | ICD-10-CM

## 2011-02-07 MED ORDER — VALSARTAN 160 MG PO TABS: 160.0000 mg | ORAL_TABLET | Freq: Every day | ORAL | Status: DC

## 2011-02-07 NOTE — Assessment & Plan Note (Signed)
Continue with current prescription therapy as reflected on the Med list.  

## 2011-02-07 NOTE — Progress Notes (Signed)
  Subjective:    Patient ID: [name], male    DOB: March 13, 1946, 65 y.o.   MRN: [id number]  HPI  He was sent here from pre-op due to elev BP 178/120 - no sx's.  Review of Systems  Constitutional: Negative for appetite change, fatigue and unexpected weight change.  HENT: Negative for nosebleeds, congestion, sore throat, sneezing, trouble swallowing and neck pain.   Eyes: Negative for itching and visual disturbance.  Respiratory: Negative for cough.   Cardiovascular: Negative for chest pain, palpitations and leg swelling.  Gastrointestinal: Negative for nausea, diarrhea, blood in stool and abdominal distention.  Genitourinary: Negative for frequency and hematuria.  Musculoskeletal: Negative for back pain, joint swelling and gait problem.  Skin: Negative for rash.  Neurological: Negative for dizziness, tremors, speech difficulty and weakness.  Psychiatric/Behavioral: Negative for sleep disturbance, dysphoric mood and agitation. The patient is not nervous/anxious.    BP Readings from Last 3 Encounters:  02/07/11 170/118  12/10/10 128/90  10/31/10 168/98   Wt Readings from Last 3 Encounters:  02/07/11 182 lb (82.555 kg)  12/10/10 184 lb 12.8 oz (83.825 kg)  10/31/10 185 lb (83.915 kg)        Objective:   Physical Exam  Constitutional: He is oriented to person, place, and time. He appears well-developed.  HENT:  Mouth/Throat: Oropharynx is clear and moist.  Eyes: Conjunctivae are normal. Pupils are equal, round, and reactive to light.  Neck: Normal range of motion. No JVD present. No thyromegaly present.  Cardiovascular: Normal rate, regular rhythm, normal heart sounds and intact distal pulses.  Exam reveals no gallop and no friction rub.   No murmur heard. Pulmonary/Chest: Effort normal and breath sounds normal. No respiratory distress. He has no wheezes. He has no rales. He exhibits no tenderness.  Abdominal: Soft. Bowel sounds are normal. He exhibits no distension and  no mass. There is no tenderness. There is no rebound and no guarding.  Musculoskeletal: Normal range of motion. He exhibits no edema and no tenderness.  Lymphadenopathy:    He has no cervical adenopathy.  Neurological: He is alert and oriented to person, place, and time. He has normal reflexes. No cranial nerve deficit. He exhibits normal muscle tone. Coordination normal.  Skin: Skin is warm and dry. No rash noted.  Psychiatric: He has a normal mood and affect. His behavior is normal. Judgment and thought content normal.     Lab Results  Component Value Date   WBC 7.0 08/29/2010   HGB 15.9 08/29/2010   HCT 45.5 08/29/2010   PLT 263.0 08/29/2010   CHOL 129 08/29/2010   TRIG 58.0 08/29/2010   HDL 53.20 08/29/2010   ALT 25 08/29/2010   AST 24 08/29/2010   NA 141 08/29/2010   K 4.8 08/29/2010   CL 103 08/29/2010   CREATININE 0.9 08/29/2010   BUN 16 08/29/2010   CO2 29 08/29/2010   TSH 1.30 08/29/2010   PSA 1.33 08/29/2010        Assessment & Plan:

## 2011-02-07 NOTE — Assessment & Plan Note (Signed)
New Will try Diovan 160 mg/day EKG was nl

## 2011-02-07 NOTE — Patient Instructions (Signed)
Call if BP is up 

## 2011-02-08 ENCOUNTER — Telehealth: Payer: Self-pay | Admitting: *Deleted

## 2011-02-08 NOTE — Telephone Encounter (Signed)
Left detailed mess informing pt's wife of below.  

## 2011-02-08 NOTE — Telephone Encounter (Signed)
Take Diovan 320 mg a day Thx

## 2011-02-08 NOTE — Telephone Encounter (Signed)
Pt's wife calling stating pt wants to go to mountains this weekend and ride horses. She wants to know if is will be ok with his BP being elevated.  Yesterday afternoon his BP was 170 /100.  This am his BP was 153/86. Please advise.

## 2011-02-15 ENCOUNTER — Telehealth: Payer: Self-pay | Admitting: *Deleted

## 2011-02-15 MED ORDER — OLMESARTAN-AMLODIPINE-HCTZ 40-5-12.5 MG PO TABS: 1.0000 | ORAL_TABLET | ORAL | Status: DC

## 2011-02-15 NOTE — Telephone Encounter (Signed)
Pt's wife calling stating pt is still having elevated BP readings. He has already doubled up on the dose like you advised.  Average readings are around 160/90.....she is asking if this med needs to be changed?

## 2011-02-15 NOTE — Telephone Encounter (Signed)
Stop Diovan Start Tribenzor 1 a day (samples) Thx

## 2011-02-18 NOTE — Telephone Encounter (Signed)
Pt's wife informed. Samples and Rx put upfront.

## 2011-02-22 ENCOUNTER — Encounter: Payer: Self-pay | Admitting: Internal Medicine

## 2011-02-22 ENCOUNTER — Ambulatory Visit (INDEPENDENT_AMBULATORY_CARE_PROVIDER_SITE_OTHER): Payer: 59 | Admitting: Internal Medicine

## 2011-02-22 ENCOUNTER — Other Ambulatory Visit (INDEPENDENT_AMBULATORY_CARE_PROVIDER_SITE_OTHER): Payer: 59

## 2011-02-22 VITALS — BP 144/96 | HR 80 | Temp 98.0°F | Resp 16 | Wt 182.0 lb

## 2011-02-22 DIAGNOSIS — R55 Syncope and collapse: Secondary | ICD-10-CM

## 2011-02-22 DIAGNOSIS — I10 Essential (primary) hypertension: Secondary | ICD-10-CM

## 2011-02-22 DIAGNOSIS — E785 Hyperlipidemia, unspecified: Secondary | ICD-10-CM

## 2011-02-22 DIAGNOSIS — K219 Gastro-esophageal reflux disease without esophagitis: Secondary | ICD-10-CM

## 2011-02-22 LAB — COMPREHENSIVE METABOLIC PANEL
Albumin: 4.2 g/dL (ref 3.5–5.2)
BUN: 15 mg/dL (ref 6–23)
Calcium: 9.6 mg/dL (ref 8.4–10.5)
Chloride: 100 mEq/L (ref 96–112)
Glucose, Bld: 142 mg/dL — ABNORMAL HIGH (ref 70–99)
Potassium: 3.8 mEq/L (ref 3.5–5.1)

## 2011-02-22 LAB — URINALYSIS
Bilirubin Urine: NEGATIVE
Hgb urine dipstick: NEGATIVE
Nitrite: NEGATIVE
Urine Glucose: NEGATIVE
Urobilinogen, UA: 0.2 (ref 0.0–1.0)

## 2011-02-22 LAB — MICROALBUMIN / CREATININE URINE RATIO: Microalb, Ur: 1 mg/dL (ref 0.0–1.9)

## 2011-02-22 MED ORDER — OLMESARTAN-AMLODIPINE-HCTZ 40-10-25 MG PO TABS: 1.0000 | ORAL_TABLET | ORAL | Status: DC

## 2011-02-22 NOTE — Progress Notes (Signed)
  Subjective:    Patient ID: [name], male    DOB: 27-Oct-1945, 65 y.o.   MRN: [id number]  HPI  C/o  Review of Systems  Constitutional: Negative for appetite change, fatigue and unexpected weight change.  HENT: Negative for nosebleeds, congestion, sore throat, sneezing, trouble swallowing and neck pain.   Eyes: Negative for itching and visual disturbance.  Respiratory: Negative for cough.   Cardiovascular: Negative for chest pain, palpitations and leg swelling.  Gastrointestinal: Negative for nausea, diarrhea, blood in stool and abdominal distention.  Genitourinary: Negative for frequency and hematuria.  Musculoskeletal: Negative for back pain, joint swelling and gait problem.  Skin: Negative for rash.  Neurological: Negative for dizziness, tremors, speech difficulty and weakness.  Psychiatric/Behavioral: Negative for sleep disturbance, dysphoric mood and agitation. The patient is not nervous/anxious.    Wt Readings from Last 3 Encounters:  02/22/11 182 lb (82.555 kg)  02/07/11 182 lb (82.555 kg)  12/10/10 184 lb 12.8 oz (83.825 kg)   BP Readings from Last 3 Encounters:  02/22/11 144/96  02/07/11 170/118  12/10/10 128/90        Objective:   Physical Exam  Constitutional: He is oriented to person, place, and time. He appears well-developed.       obese  HENT:  Mouth/Throat: Oropharynx is clear and moist.  Eyes: Conjunctivae are normal. Pupils are equal, round, and reactive to light.  Neck: Normal range of motion. No JVD present. No thyromegaly present.  Cardiovascular: Normal rate, regular rhythm, normal heart sounds and intact distal pulses.  Exam reveals no gallop and no friction rub.   No murmur heard. Pulmonary/Chest: Effort normal and breath sounds normal. No respiratory distress. He has no wheezes. He has no rales. He exhibits no tenderness.  Abdominal: Soft. Bowel sounds are normal. He exhibits no distension and no mass. There is no tenderness. There is no  rebound and no guarding.  Musculoskeletal: Normal range of motion. He exhibits no edema and no tenderness.  Lymphadenopathy:    He has no cervical adenopathy.  Neurological: He is alert and oriented to person, place, and time. He has normal reflexes. No cranial nerve deficit. He exhibits normal muscle tone. Coordination normal.  Skin: Skin is warm and dry. No rash noted.  Psychiatric: He has a normal mood and affect. His behavior is normal. Judgment and thought content normal.          Assessment & Plan:

## 2011-02-24 ENCOUNTER — Encounter: Payer: Self-pay | Admitting: Internal Medicine

## 2011-02-24 NOTE — Assessment & Plan Note (Signed)
Poorly controlled. R/o secondary HTN - will get abd MRA to r/o renal arterial HTN

## 2011-02-24 NOTE — Assessment & Plan Note (Signed)
Has not re-occurred yet

## 2011-02-24 NOTE — Assessment & Plan Note (Signed)
Continue with current prescription therapy as reflected on the Med list.  

## 2011-03-04 ENCOUNTER — Ambulatory Visit
Admission: RE | Admit: 2011-03-04 | Discharge: 2011-03-04 | Disposition: A | Discharge status: Home / Self Care | Payer: 59 | Source: Ambulatory Visit | Attending: Internal Medicine | Admitting: Internal Medicine

## 2011-03-04 ENCOUNTER — Telehealth: Payer: Self-pay | Admitting: Internal Medicine

## 2011-03-04 DIAGNOSIS — I10 Essential (primary) hypertension: Secondary | ICD-10-CM

## 2011-03-04 DIAGNOSIS — N1330 Unspecified hydronephrosis: Secondary | ICD-10-CM

## 2011-03-04 MED ORDER — GADOBENATE DIMEGLUMINE 529 MG/ML IV SOLN
17.0000 mL | Freq: Once | INTRAVENOUS | Status: AC | PRN
  Administered 2011-03-04: 17 mL via INTRAVENOUS

## 2011-03-04 NOTE — Telephone Encounter (Signed)
[name], please, inform patient that his kidney artery MRA was normal except for urine back up in L kidney: this could be a trigger of his HTN. We will ref to Urol for a check up Thx. Keep ROV AP

## 2011-03-05 ENCOUNTER — Encounter: Payer: Self-pay | Admitting: Gastroenterology

## 2011-03-07 ENCOUNTER — Ambulatory Visit: Payer: 59 | Admitting: Internal Medicine

## 2011-03-07 ENCOUNTER — Telehealth: Payer: Self-pay | Admitting: *Deleted

## 2011-03-07 NOTE — Telephone Encounter (Signed)
Please see my previous tel note. We are setting an appt for him to see a urologist to address a mild back up in urine drainage. We mailed him a copy of the MRI. Overall ok Thx

## 2011-03-07 NOTE — Telephone Encounter (Signed)
Pt informed/copy mailed. 

## 2011-03-07 NOTE — Telephone Encounter (Signed)
Pt left vm requesting results of recent MRA. Please advise.

## 2011-03-07 NOTE — Telephone Encounter (Signed)
Pt informed/copy mailed.

## 2011-03-14 ENCOUNTER — Telehealth: Payer: Self-pay | Admitting: *Deleted

## 2011-03-14 ENCOUNTER — Ambulatory Visit (INDEPENDENT_AMBULATORY_CARE_PROVIDER_SITE_OTHER): Payer: 59 | Admitting: Internal Medicine

## 2011-03-14 ENCOUNTER — Encounter: Payer: Self-pay | Admitting: Internal Medicine

## 2011-03-14 VITALS — BP 136/88 | HR 97 | Temp 98.6°F | Ht 64.0 in | Wt 174.4 lb

## 2011-03-14 DIAGNOSIS — L509 Urticaria, unspecified: Secondary | ICD-10-CM | POA: Insufficient documentation

## 2011-03-14 DIAGNOSIS — I10 Essential (primary) hypertension: Secondary | ICD-10-CM

## 2011-03-14 DIAGNOSIS — R7309 Other abnormal glucose: Secondary | ICD-10-CM

## 2011-03-14 DIAGNOSIS — J45909 Unspecified asthma, uncomplicated: Secondary | ICD-10-CM

## 2011-03-14 MED ORDER — NEBIVOLOL HCL 10 MG PO TABS: 10.0000 mg | ORAL_TABLET | Freq: Every day | ORAL | Status: DC

## 2011-03-14 MED ORDER — PREDNISONE 10 MG PO TABS: 10.0000 mg | ORAL_TABLET | Freq: Every day | ORAL | Status: AC

## 2011-03-14 MED ORDER — METHYLPREDNISOLONE ACETATE 80 MG/ML IJ SUSP
120.0000 mg | Freq: Once | INTRAMUSCULAR | Status: AC
  Administered 2011-03-14: 120 mg via INTRAMUSCULAR

## 2011-03-14 MED ORDER — AMLODIPINE BESYLATE 10 MG PO TABS: 10.0000 mg | ORAL_TABLET | Freq: Every day | ORAL | Status: DC

## 2011-03-14 MED ORDER — HYDROCHLOROTHIAZIDE 25 MG PO TABS: 25.0000 mg | ORAL_TABLET | Freq: Every day | ORAL | Status: DC

## 2011-03-14 NOTE — Assessment & Plan Note (Signed)
Asympt, pt to call for onset polys or cbg > 200 

## 2011-03-14 NOTE — Assessment & Plan Note (Signed)
stable overall by hx and exam, most recent data reviewed with pt, and pt to continue medical treatment as before  SpO2 Readings from Last 3 Encounters:  03/14/11 94%  12/10/10 93%  11/13/09 94%

## 2011-03-14 NOTE — Progress Notes (Signed)
Subjective:    Patient ID: [name], male    DOB: [birth year], 65 y.o.   MRN: [id number]  HPI  Here with acute visit, pt has been on benicar for some time with difficult to control BP, now on tribenzor and doing well, having been on this only 2 wks, but now 2 day onset diffuse itchy rash, mostly to the whole torso but somewhat to extremities as well, with itching worse at night, hard to sleep, better with benadryl , no tongue, throat or other swelling , no wheezing.  Pt denies chest pain, increased sob or doe, wheezing, orthopnea, PND, increased LE swelling, palpitations, dizziness or syncope.  Pt denies new neurological symptoms such as new headache, or facial or extremity weakness or numbness   Pt denies polydipsia, polyuria.   Pt denies fever, wt loss, night sweats, loss of appetite, or other constitutional symptoms Past Medical History  Diagnosis Date  . GERD (gastroesophageal reflux disease)   . Hyperlipidemia    Past Surgical History  Procedure Date  . Appendectomy 1963    reports that he quit smoking about 9 years ago. His smoking use included Cigarettes. He has a 60 pack-year smoking history. He has never used smokeless tobacco. He reports that he drinks about 3 ounces of alcohol per week. He reports that he does not use illicit drugs. family history includes Coronary artery disease in his other; Heart disease (age of onset:49) in his father; Heart disease (age of onset:76) in his sister; Hypertension in his other; Multiple sclerosis in his mother; and Parkinsonism in his brother. No Known Allergies Current Outpatient Prescriptions on File Prior to Visit  Medication Sig Dispense Refill  . albuterol (PROAIR HFA) 108 (90 BASE) MCG/ACT inhaler Inhale 2 puffs into the lungs every 6 (six) hours as needed for wheezing.  1 Inhaler  6  . aspirin 81 MG EC tablet Take 81 mg by mouth daily.        [name] atorvastatin (LIPITOR) 20 MG tablet Take 1 tablet (20 mg total) by mouth daily.  30 tablet   11  . Cholecalciferol (VITAMIN D3) 1000 UNITS tablet Take 1,000 Units by mouth daily.        [name] esomeprazole (NEXIUM) 40 MG capsule Take 1 capsule (40 mg total) by mouth daily.  30 capsule  11  . folic acid (FOLVITE) 1 MG tablet Take 2 tablets (2 mg total) by mouth daily.  60 tablet  11  . Mometasone Furo-Formoterol Fum (DULERA) 200-5 MCG/ACT AERO Inhale 2 sprays into the lungs daily after breakfast.  1 Inhaler  11   No current facility-administered medications on file prior to visit.   Review of Systems Review of Systems  Constitutional: Negative for diaphoresis and unexpected weight change.  HENT: Negative for drooling and tinnitus.   Eyes: Negative for photophobia and visual disturbance.  Respiratory: Negative for choking and stridor.   Gastrointestinal: Negative for vomiting and blood in stool.  Genitourinary: Negative for hematuria and decreased urine volume.       Objective:   Physical Exam BP 136/88  Pulse 97  Temp(Src) 98.6 F (37 C) (Oral)  Ht 5\' 4"  (1.626 m)  Wt 174 lb 6.4 oz (79.107 kg)  BMI 29.94 kg/m2  SpO2 94% Physical Exam  VS noted Constitutional: Pt appears well-developed and well-nourished.  HENT: Head: Normocephalic.no tongue or throat swelling  Right Ear: External ear normal.  Left Ear: External ear normal.  Eyes: Conjunctivae and EOM are normal. Pupils are equal, round, and  reactive to light.  Neck: Normal range of motion. Neck supple.  Cardiovascular: Normal rate and regular rhythm.   Pulmonary/Chest: Effort normal and breath sounds normal.  Abd:  Soft, NT, non-distended, + BS Neurological: Pt is alert. No cranial nerve deficit.  Skin: Skin is warm. No erythema. except for diffuse erythema rash hive like wheel and flare Psychiatric: Pt behavior is normal. Thought content normal.     Assessment & Plan:

## 2011-03-14 NOTE — Telephone Encounter (Signed)
Pt's wife calling triage stating pt has developed a rash and itching since last night. She thinks it may be from new blood pressure med pt started 2 wks ago. He has tried benadryl with minimal relief. He is in meetings at work this am and needs to be seen after lunch. Offered 3:15 appt with Dr. Everardo All in PCP's absence. Pt scheduled at 4:15pm with Dr. Jonny Ruiz.

## 2011-03-14 NOTE — Patient Instructions (Signed)
You had the steroid shot today Take all new medications as prescribed  - the prednisone, and the bystolic at 10 mg per day (one per day of the samples) Please stop the Tribenzor Please start the amlodipine 10 mg per day, and the HCTZ fluid pill at 25 mg per day You can also take the benadryl 50 mg as needed for itching, but please do not take and drive longer distances

## 2011-03-14 NOTE — Assessment & Plan Note (Signed)
To stop the tribenzor, change to bystolic/norvasc/hctz asd

## 2011-03-14 NOTE — Assessment & Plan Note (Signed)
Mild to mod, for predpack,  to f/u any worsening symptoms or concerns 

## 2011-03-15 ENCOUNTER — Telehealth: Payer: Self-pay

## 2011-03-15 NOTE — Telephone Encounter (Signed)
Yes. Thx.

## 2011-03-15 NOTE — Telephone Encounter (Signed)
Pt's spouse advised 

## 2011-03-15 NOTE — Telephone Encounter (Signed)
Pt's spouse called requesting MD's advise: Pt will have procedure for blocked urether done by Dr Brunilda Payor, is this okay with Dr Posey Rea?

## 2011-03-25 ENCOUNTER — Ambulatory Visit (INDEPENDENT_AMBULATORY_CARE_PROVIDER_SITE_OTHER): Payer: 59 | Admitting: Internal Medicine

## 2011-03-25 ENCOUNTER — Encounter: Payer: Self-pay | Admitting: Internal Medicine

## 2011-03-25 DIAGNOSIS — J45909 Unspecified asthma, uncomplicated: Secondary | ICD-10-CM

## 2011-03-25 LAB — PULMONARY FUNCTION TEST

## 2011-03-25 MED ORDER — MOMETASONE FURO-FORMOTEROL FUM 100-5 MCG/ACT IN AERO: 2.0000 | INHALATION_SPRAY | Freq: Two times a day (BID) | RESPIRATORY_TRACT | Status: DC

## 2011-03-25 NOTE — Progress Notes (Signed)
Subjective:     Patient ID: [name], male   DOB: March 08, 1946, 65 y.o.   MRN: [id number]  HPI  28 yowm quit smoking 2003 with some coughing that improved p quit but never completely resolved.  12/10/2010 ov/Kiari Hosmer Initial pulmonary office eval in EMR era chronic cough ("all my life", maybe started p started smoking)  some better on dulera x 2 months ,  Tends to be worse during the day and better sleeping,  No am awakening or early exac.  No problems with using voice or swallowing. Takes nexium right before bfast x many years.  Laughing hard can bring it own.only using dulera 200 now.  No purulent sputum over sinus or reflux symptoms rec GERD diet  Increase dulera 200 Take 2 puffs first thing in am and then another 2 puffs about 12 hours later.  Work on inhaler technique   03/25/2011 f/u ov/Violette Morneault cc no more cough or sob on dulera 200 2bid and even got through a bad uri on trip to [place] s the need for saba  Sleeping ok without nocturnal  or early am exacerbation  of respiratory  c/o's or need for noct saba. Also denies any obvious fluctuation of symptoms with weather or environmental changes or other aggravating or alleviating factors except as outlined above   Pt denies any significant sore throat, dysphagia, itching, sneezing,  nasal congestion or excess/ purulent secretions,  fever, chills, sweats, unintended wt loss, pleuritic or exertional cp, hempoptysis, orthopnea pnd or leg swelling.              Objective:   Physical Exam    very pleasant amb wm with mild pot belly contour habitus/ mod hoarse  Wt 184 12/10/2010  > 180 03/25/2011  HEENT mild turbinate edema.  Oropharynx no thrush or excess pnd or cobblestoning.  No JVD or cervical adenopathy. Mild accessory muscle hypertrophy. Trachea midline, nl thryroid. Chest was hyperinflated by percussion with diminished breath sounds and moderate increased exp time with trace end exp cough/ wheeze. Hoover sign positive at mid  inspiration. Regular rate and rhythm without murmur gallop or rub or increase P2 or edema.  Abd: no hsm, nl excursion. Ext warm without cyanosis or clubbing.   Assessment:         Plan:

## 2011-03-25 NOTE — Assessment & Plan Note (Signed)
All goals of chronic asthma control met including optimal function and elimination of symptoms with minimal need for rescue therapy.  Contingencies discussed in full including contacting this office immediately if not controlling the symptoms using the rule of two's.     The proper method of use, as well as anticipated side effects, of this metered-dose inhaler are discussed and demonstrated to the patient. Improved to 90% with coaching and since pft's nl and hoarse try dulera 100 2bid

## 2011-03-25 NOTE — Progress Notes (Signed)
PFT done today. 

## 2011-03-25 NOTE — Patient Instructions (Signed)
Work on Chemical engineer technique:  relax and gently blow all the way out then take a nice smooth deep breath back in, triggering the inhaler at same time you start breathing in.  Hold for up to 5 seconds if you can.  Rinse and gargle with water when done   If your mouth or throat starts to bother you,   I suggest you time the inhaler to your dental care and after using the inhaler(s) brush teeth and tongue with a  baking soda containing toothpaste and when you rinse this out, gargle with it first to see if this helps your mouth and throat.    Your lung functions show you do not have any lung damage from smoking and very likely never will  You should be able therefore to use a lower strength of dulera once your present inhaler is used up:  dulera 100 Take 2 puffs first thing in am and then another 2 puffs about 12 hours later.       If you are satisfied with your treatment plan let your doctor know and he/she can either refill your medications or you can return here when your prescription runs out.     If in any way you are not 100% satisfied,  please tell us.  If 100% better, tell your friends!

## 2011-03-26 ENCOUNTER — Ambulatory Visit (HOSPITAL_BASED_OUTPATIENT_CLINIC_OR_DEPARTMENT_OTHER)
Admission: RE | Admit: 2011-03-26 | Discharge: 2011-03-26 | Disposition: A | Discharge status: Home / Self Care | Payer: 59 | Source: Ambulatory Visit | Attending: Urology | Admitting: Urology

## 2011-03-26 DIAGNOSIS — Z01812 Encounter for preprocedural laboratory examination: Secondary | ICD-10-CM | POA: Insufficient documentation

## 2011-03-26 DIAGNOSIS — I10 Essential (primary) hypertension: Secondary | ICD-10-CM | POA: Insufficient documentation

## 2011-03-26 DIAGNOSIS — Z01810 Encounter for preprocedural cardiovascular examination: Secondary | ICD-10-CM | POA: Insufficient documentation

## 2011-03-26 DIAGNOSIS — R109 Unspecified abdominal pain: Secondary | ICD-10-CM | POA: Insufficient documentation

## 2011-03-26 DIAGNOSIS — Z7982 Long term (current) use of aspirin: Secondary | ICD-10-CM | POA: Insufficient documentation

## 2011-03-26 DIAGNOSIS — K219 Gastro-esophageal reflux disease without esophagitis: Secondary | ICD-10-CM | POA: Insufficient documentation

## 2011-03-26 DIAGNOSIS — N1330 Unspecified hydronephrosis: Secondary | ICD-10-CM | POA: Insufficient documentation

## 2011-03-26 DIAGNOSIS — N400 Enlarged prostate without lower urinary tract symptoms: Secondary | ICD-10-CM | POA: Insufficient documentation

## 2011-03-26 DIAGNOSIS — N201 Calculus of ureter: Secondary | ICD-10-CM | POA: Insufficient documentation

## 2011-03-26 DIAGNOSIS — Z79899 Other long term (current) drug therapy: Secondary | ICD-10-CM | POA: Insufficient documentation

## 2011-03-26 HISTORY — PX: CYSTOSCOPY W/ URETERAL STENT PLACEMENT: SHX1429

## 2011-03-26 LAB — POCT I-STAT 4, (NA,K, GLUC, HGB,HCT)
HCT: 46 % (ref 39.0–52.0)
Hemoglobin: 15.6 g/dL (ref 13.0–17.0)
Potassium: 3.8 mEq/L (ref 3.5–5.1)

## 2011-03-27 NOTE — Op Note (Signed)
NAMEJAVIUS, [name]              ACCOUNT NO.:  [id number]  MEDICAL RECORD NO.:  [id number]  LOCATION:                                 FACILITY:  PHYSICIAN:  Danae Chen, M.D.       DATE OF BIRTH:  DATE OF PROCEDURE:  03/26/2011 DATE OF DISCHARGE:                              OPERATIVE REPORT   PREOPERATIVE DIAGNOSIS:  Right hydronephrosis.  POSTOPERATIVE DIAGNOSES: 1. Right hydronephrosis. 2. Right ureteral calculus.  PROCEDURES DONE:  Cystoscopy, right retrograde pyelogram, ureteroscopy, holmium laser right ureteral stone, stone extraction, and insertion of double-J stent.  SURGEON:  Danae Chen, M.D.  ANESTHESIA:  General.  INDICATION:  The patient is a 65 year old male who has been complaining of right flank pain on and off.  MRA for evaluation of hypertension showed a moderate right hydronephrosis to the level of the proximal ureter without definite etiology of the hydronephrosis.  He had a cystoscopy in [place], [place] for gross hematuria several years ago. He is scheduled today for cystoscopy, right retrograde pyelogram, and ureteroscopy.  The patient was identified by his wristband and proper time-out was taken.  Under general anesthesia, he was prepped and draped and placed in the dorsal lithotomy position.  A panendoscope was inserted in the bladder. The anterior urethra is normal.  He has moderate prostatic hypertrophy. The bladder mucosa is normal.  There is no stone or tumor in the bladder.  The ureteral orifices are in normal position and shape.  Retrograde pyelogram:  A cone-tip catheter was passed through the cystoscope and through the right ureteral orifice.  Contrast was then injected through the cone-tip catheter.  The distal and mid ureter appear normal.  There is a filling defect in the proximal ureter about 3 cm below the UPJ.  The renal pelvis and calyces are moderately dilated. The cone-tip catheter was removed.  A Sensor wire was  passed through the cystoscope and through the right ureter.  The ureteroscope access sheath was then passed over the Sensor wire, and the Sensor wire was removed.  The digital flexible ureteroscope was then passed through the ureteroscope access sheath.  At the 1st site, there was a lot of edema and I thought that represented a ureteral tumor.  I attempted to biopsy what I thought was a ureteral lesion, with a Nitinol basket.  A small fragment of tissue was extracted and was found to be hard.  Then proximal to the edema, there was a stone in the ureter.  I then used the holmium laser and fragmented the stone in multiple stone fragments.  I removed the larger fragment with the Nitinol basket through the ureteroscope access sheath and then I was able to pass the flexible ureteroscope without difficulty into the renal pelvis.  There was no evidence of ureteral tumor.  The ureteroscope was then removed.  The Sensor wire was then passed through the ureteroscope access sheath.  Then, the ureteroscope access sheath was removed.  The Sensor wire was then back loaded into the cystoscope, and a #[id number] double-J stent was passed over the guidewire.  The proximal curl of the double-J stent is in the renal pelvis.  The distal curl  is in the bladder.  The cystoscope and guidewire were removed.  The patient tolerated the procedure well and left the OR in satisfactory condition to post anesthesia care unit.     Danae Chen, M.D.     MN/MEDQ  D:  03/26/2011  T:  03/26/2011  Job:  [id number]  cc:   Georgina Quint. Plotnikov, MD [address] [place] [address]  Electronically Signed by Lindaann Slough M.D. on 03/27/2011 07:04:16 PM

## 2011-03-28 ENCOUNTER — Encounter: Payer: Self-pay | Admitting: Gastroenterology

## 2011-03-28 ENCOUNTER — Ambulatory Visit (AMBULATORY_SURGERY_CENTER): Payer: 59 | Admitting: *Deleted

## 2011-03-28 VITALS — Ht 64.0 in | Wt 180.0 lb

## 2011-03-28 DIAGNOSIS — Z1211 Encounter for screening for malignant neoplasm of colon: Secondary | ICD-10-CM

## 2011-03-28 MED ORDER — PEG-KCL-NACL-NASULF-NA ASC-C 100 G PO SOLR: ORAL | Status: DC

## 2011-03-29 ENCOUNTER — Encounter: Payer: Self-pay | Admitting: Internal Medicine

## 2011-04-08 ENCOUNTER — Encounter: Payer: Self-pay | Admitting: Internal Medicine

## 2011-04-08 ENCOUNTER — Ambulatory Visit (INDEPENDENT_AMBULATORY_CARE_PROVIDER_SITE_OTHER): Payer: 59 | Admitting: Internal Medicine

## 2011-04-08 VITALS — BP 116/72 | HR 80 | Temp 98.1°F | Resp 16 | Wt 178.0 lb

## 2011-04-08 DIAGNOSIS — I10 Essential (primary) hypertension: Secondary | ICD-10-CM

## 2011-04-08 DIAGNOSIS — E785 Hyperlipidemia, unspecified: Secondary | ICD-10-CM

## 2011-04-08 DIAGNOSIS — L509 Urticaria, unspecified: Secondary | ICD-10-CM

## 2011-04-08 MED ORDER — TRAMADOL HCL 50 MG PO TABS: 50.0000 mg | ORAL_TABLET | Freq: Two times a day (BID) | ORAL | Status: DC | PRN

## 2011-04-08 NOTE — Progress Notes (Signed)
  Subjective:    Patient ID: [name], male    DOB: [birth year], 65 y.o.   MRN: [id number]  HPI  The patient presents for a follow-up of  chronic hypertension, chronic dyslipidemia, hydronephrosis controlled with medicines    Review of Systems  Constitutional: Negative for appetite change, fatigue and unexpected weight change.  HENT: Negative for nosebleeds, congestion, sore throat, sneezing, trouble swallowing and neck pain.   Eyes: Negative for itching and visual disturbance.  Respiratory: Negative for cough.   Cardiovascular: Negative for chest pain, palpitations and leg swelling.  Gastrointestinal: Negative for nausea, diarrhea, blood in stool and abdominal distention.  Genitourinary: Negative for frequency and hematuria.  Musculoskeletal: Negative for back pain, joint swelling and gait problem.  Skin: Negative for rash.  Neurological: Negative for dizziness, tremors, speech difficulty and weakness.  Psychiatric/Behavioral: Negative for sleep disturbance, dysphoric mood and agitation. The patient is not nervous/anxious.        Objective:   Physical Exam  Constitutional: He is oriented to person, place, and time. He appears well-developed.  HENT:  Mouth/Throat: Oropharynx is clear and moist.  Eyes: Conjunctivae are normal. Pupils are equal, round, and reactive to light.  Neck: Normal range of motion. No JVD present. No thyromegaly present.  Cardiovascular: Normal rate, regular rhythm, normal heart sounds and intact distal pulses.  Exam reveals no gallop and no friction rub.   No murmur heard. Pulmonary/Chest: Effort normal and breath sounds normal. No respiratory distress. He has no wheezes. He has no rales. He exhibits no tenderness.  Abdominal: Soft. Bowel sounds are normal. He exhibits no distension and no mass. There is no tenderness. There is no rebound and no guarding.  Musculoskeletal: Normal range of motion. He exhibits no edema and no tenderness.    Lymphadenopathy:    He has no cervical adenopathy.  Neurological: He is alert and oriented to person, place, and time. He has normal reflexes. No cranial nerve deficit. He exhibits normal muscle tone. Coordination normal.  Skin: Skin is warm and dry. No rash noted.  Psychiatric: He has a normal mood and affect. His behavior is normal. Judgment and thought content normal.   BP is 120/ 80 B arms -- rechecked       Assessment & Plan:

## 2011-04-11 ENCOUNTER — Ambulatory Visit (AMBULATORY_SURGERY_CENTER): Payer: 59 | Admitting: Gastroenterology

## 2011-04-11 ENCOUNTER — Encounter: Payer: Self-pay | Admitting: Gastroenterology

## 2011-04-11 DIAGNOSIS — Z1211 Encounter for screening for malignant neoplasm of colon: Secondary | ICD-10-CM

## 2011-04-11 DIAGNOSIS — K5730 Diverticulosis of large intestine without perforation or abscess without bleeding: Secondary | ICD-10-CM

## 2011-04-11 MED ORDER — SODIUM CHLORIDE 0.9 % IV SOLN: 500.0000 mL | INTRAVENOUS | Status: DC

## 2011-04-11 NOTE — Patient Instructions (Signed)
You need to come back in 10 years.   Your test today was normal.

## 2011-04-12 ENCOUNTER — Telehealth: Payer: Self-pay

## 2011-04-12 NOTE — Telephone Encounter (Signed)
Not available

## 2011-04-21 ENCOUNTER — Encounter: Payer: Self-pay | Admitting: Internal Medicine

## 2011-04-21 NOTE — Assessment & Plan Note (Signed)
2012 due to Benicar Resolved

## 2011-04-21 NOTE — Assessment & Plan Note (Signed)
Continue with current prescription therapy as reflected on the Med list.  

## 2011-05-10 ENCOUNTER — Ambulatory Visit: Payer: 59 | Admitting: Internal Medicine

## 2011-05-13 ENCOUNTER — Other Ambulatory Visit: Payer: Self-pay

## 2011-05-13 MED ORDER — NEBIVOLOL HCL 10 MG PO TABS: 10.0000 mg | ORAL_TABLET | Freq: Every day | ORAL | Status: DC

## 2011-05-13 NOTE — Telephone Encounter (Signed)
Pt's spouse called stating Rx for Bystolic was lost. Rx resent per request

## 2011-05-14 ENCOUNTER — Encounter: Payer: Self-pay | Admitting: Internal Medicine

## 2011-05-14 ENCOUNTER — Ambulatory Visit (INDEPENDENT_AMBULATORY_CARE_PROVIDER_SITE_OTHER): Payer: 59 | Admitting: Internal Medicine

## 2011-05-14 DIAGNOSIS — I10 Essential (primary) hypertension: Secondary | ICD-10-CM

## 2011-05-14 DIAGNOSIS — K219 Gastro-esophageal reflux disease without esophagitis: Secondary | ICD-10-CM

## 2011-05-14 DIAGNOSIS — R55 Syncope and collapse: Secondary | ICD-10-CM

## 2011-05-14 DIAGNOSIS — J069 Acute upper respiratory infection, unspecified: Secondary | ICD-10-CM

## 2011-05-14 MED ORDER — AMOXICILLIN 500 MG PO CAPS: 1000.0000 mg | ORAL_CAPSULE | Freq: Two times a day (BID) | ORAL | Status: AC

## 2011-05-14 MED ORDER — PREDNISONE 10 MG PO TABS: ORAL_TABLET | ORAL | Status: DC

## 2011-05-14 NOTE — Patient Instructions (Signed)
Use over-the-counter  "cold" medicines  such as"Afrin" nasal spray for nasal congestion as directed instead. Use" Delsym" or" Robitussin" cough syrup varietis for cough.  You can use plain "Tylenol" or "Advil' for fever, chills and achyness.   

## 2011-05-14 NOTE — Progress Notes (Signed)
  Subjective:    Patient ID: [name], male    DOB: 08-13-1945, 65 y.o.   MRN: [id number]  HPI  HPI  C/o URI sx's x 14  days. C/o ST, cough, weakness. Not better with OTC medicines. Actually, the patient is getting better after Tamiflu (flu type A + test) and a Zpac. He lost voice 1 wk ago.  F/u HTN, syncope  Review of Systems  Constitutional: Positive for fever, chills and fatigue.  HENT: Positive for congestion, rhinorrhea, sneezing and postnasal drip - yellow d/c Eyes: Positive for photophobia and pain. Negative for discharge and visual disturbance.  Respiratory: Positive for cough and wheezing.   Positive for chest pain.  Gastrointestinal: Negative for vomiting, abdominal pain, diarrhea and abdominal distention.  Genitourinary: Negative for dysuria and difficulty urinating.  Skin: Negative for rash.  Neurological: Positive for dizziness, weakness and light-headedness.       Review of Systems     Objective:   Physical Exam  Constitutional: He is oriented to person, place, and time. He appears well-developed.  HENT:  Right Ear: External ear normal.  Left Ear: External ear normal.       eryth throat  Eyes: Conjunctivae are normal. Pupils are equal, round, and reactive to light.  Neck: Normal range of motion. No JVD present. No thyromegaly present.  Cardiovascular: Normal rate, regular rhythm, normal heart sounds and intact distal pulses.  Exam reveals no gallop and no friction rub.   No murmur heard. Pulmonary/Chest: Effort normal and breath sounds normal. No respiratory distress. He has no wheezes. He has no rales. He exhibits no tenderness.  Abdominal: Soft. Bowel sounds are normal. He exhibits no distension and no mass. There is no tenderness. There is no rebound and no guarding.  Musculoskeletal: Normal range of motion. He exhibits no edema and no tenderness.  Lymphadenopathy:    He has no cervical adenopathy.  Neurological: He is alert and oriented to person,  place, and time. He has normal reflexes. No cranial nerve deficit. He exhibits normal muscle tone. Coordination normal.  Skin: Skin is warm and dry. No rash noted.  Psychiatric: He has a normal mood and affect. His behavior is normal. Judgment and thought content normal.          Assessment & Plan:

## 2011-05-19 ENCOUNTER — Encounter: Payer: Self-pay | Admitting: Internal Medicine

## 2011-05-19 DIAGNOSIS — J069 Acute upper respiratory infection, unspecified: Secondary | ICD-10-CM | POA: Insufficient documentation

## 2011-05-19 NOTE — Assessment & Plan Note (Signed)
2012 - new Better after renal stone evacuation 10/12

## 2011-05-19 NOTE — Assessment & Plan Note (Signed)
See meds 

## 2011-05-19 NOTE — Assessment & Plan Note (Signed)
Eval  in Pulmonary clinic/ [facility]/ [facility]/ 12/10/2010     - C/w cough syncope  No relapse

## 2011-05-19 NOTE — Assessment & Plan Note (Signed)
Continue with current prescription therapy as reflected on the Med list.  

## 2011-07-04 ENCOUNTER — Other Ambulatory Visit: Payer: Self-pay | Admitting: Urology

## 2011-07-11 ENCOUNTER — Encounter (HOSPITAL_COMMUNITY): Payer: Self-pay | Admitting: *Deleted

## 2011-07-11 NOTE — Progress Notes (Signed)
Instructed to read everything in blue folder. Follow guidelines re: restricted medications prior to procedure according to [facility]. Laxative as directed by doctor, plenty of fluids and light dinner day prior to procedure.

## 2011-07-16 ENCOUNTER — Encounter (HOSPITAL_COMMUNITY): Payer: Self-pay | Admitting: Pharmacy Technician

## 2011-07-18 ENCOUNTER — Ambulatory Visit (HOSPITAL_COMMUNITY): Payer: 59

## 2011-07-18 ENCOUNTER — Encounter (HOSPITAL_COMMUNITY)
Admission: RE | Disposition: A | Discharge status: Home / Self Care | Payer: Self-pay | Source: Ambulatory Visit | Attending: Urology

## 2011-07-18 ENCOUNTER — Encounter (HOSPITAL_COMMUNITY): Payer: Self-pay | Admitting: *Deleted

## 2011-07-18 ENCOUNTER — Ambulatory Visit (HOSPITAL_COMMUNITY)
Admission: RE | Admit: 2011-07-18 | Discharge: 2011-07-18 | Disposition: A | Discharge status: Home / Self Care | Payer: 59 | Source: Ambulatory Visit | Attending: Urology | Admitting: Urology

## 2011-07-18 DIAGNOSIS — N201 Calculus of ureter: Secondary | ICD-10-CM | POA: Insufficient documentation

## 2011-07-18 DIAGNOSIS — Z7982 Long term (current) use of aspirin: Secondary | ICD-10-CM | POA: Insufficient documentation

## 2011-07-18 DIAGNOSIS — Z01818 Encounter for other preprocedural examination: Secondary | ICD-10-CM | POA: Insufficient documentation

## 2011-07-18 DIAGNOSIS — I10 Essential (primary) hypertension: Secondary | ICD-10-CM | POA: Insufficient documentation

## 2011-07-18 DIAGNOSIS — K219 Gastro-esophageal reflux disease without esophagitis: Secondary | ICD-10-CM | POA: Insufficient documentation

## 2011-07-18 DIAGNOSIS — E785 Hyperlipidemia, unspecified: Secondary | ICD-10-CM | POA: Insufficient documentation

## 2011-07-18 DIAGNOSIS — Z79899 Other long term (current) drug therapy: Secondary | ICD-10-CM | POA: Insufficient documentation

## 2011-07-18 DIAGNOSIS — J45909 Unspecified asthma, uncomplicated: Secondary | ICD-10-CM | POA: Insufficient documentation

## 2011-07-18 SURGERY — LITHOTRIPSY, ESWL
Anesthesia: LOCAL | Laterality: Right

## 2011-07-18 MED ORDER — DIAZEPAM 5 MG PO TABS
ORAL_TABLET | ORAL | Status: AC
  Administered 2011-07-18: 10 mg via ORAL
  Filled 2011-07-18: qty 2

## 2011-07-18 MED ORDER — DEXTROSE-NACL 5-0.45 % IV SOLN
INTRAVENOUS | Status: DC
  Administered 2011-07-18: 11:00:00 via INTRAVENOUS

## 2011-07-18 MED ORDER — DIAZEPAM 5 MG PO TABS
10.0000 mg | ORAL_TABLET | ORAL | Status: AC
  Administered 2011-07-18: 10 mg via ORAL

## 2011-07-18 MED ORDER — DIPHENHYDRAMINE HCL 25 MG PO CAPS
ORAL_CAPSULE | ORAL | Status: AC
  Administered 2011-07-18: 25 mg via ORAL
  Filled 2011-07-18: qty 1

## 2011-07-18 MED ORDER — DIPHENHYDRAMINE HCL 25 MG PO CAPS
25.0000 mg | ORAL_CAPSULE | ORAL | Status: AC
  Administered 2011-07-18: 25 mg via ORAL

## 2011-07-18 MED ORDER — CIPROFLOXACIN HCL 500 MG PO TABS
500.0000 mg | ORAL_TABLET | ORAL | Status: AC
  Administered 2011-07-18: 500 mg via ORAL

## 2011-07-18 MED ORDER — CIPROFLOXACIN HCL 500 MG PO TABS
ORAL_TABLET | ORAL | Status: AC
  Administered 2011-07-18: 500 mg via ORAL
  Filled 2011-07-18: qty 1

## 2011-07-18 NOTE — Op Note (Signed)
Refer to [facility] Op Note scanned in the chart 

## 2011-07-18 NOTE — Progress Notes (Signed)
Pt denies any aspirin,ibuprofen toradol or naproxen in the last 72 hours

## 2011-07-18 NOTE — H&P (Signed)
History and Physical    History of Present Illness: [name] had right ureteral stone extraction on 03/26/11.  He was originally thought to have a ureteral tumor.  At the time of ureteroscopy he was found to have a ureteral stone that was treated with holmium laser.  He has been having  Moderate right flank pain on and off.  CT scan revealed a 10 mm proximal right ureteral calculus.  He is scheduled today for ESL.  Past Medical History  Diagnosis Date  . GERD (gastroesophageal reflux disease)   . Hyperlipidemia   . Hypertension   . Chronic kidney disease     kidney stone and infection- started 03-27-11  . Asthma     mild, chronic cough   Past Surgical History  Procedure Date  . Appendectomy 1963  . Cystoscopy w/ ureteral stent placement 03/26/11    removal of kidney stone    Medications:Aspirin, Dulera AERO, Folic acid, Lipitor, Nexium, Tribenzor, Vit D. Allergies:  Allergies  Allergen Reactions  . Olmesartan     hives    Family History  Problem Relation Age of Onset  . Hypertension Other   . Coronary artery disease Other   . Heart disease Father 76    MI  . Heart disease Sister 57    CAD  . Multiple sclerosis Mother   . Parkinsonism Brother   . Colon cancer Neg Hx   . Esophageal cancer Neg Hx   . Stomach cancer Neg Hx    Social History:  reports that he quit smoking about 10 years ago. His smoking use included Cigarettes. He has a 60 pack-year smoking history. He has quit using smokeless tobacco. He reports that he drinks about 6 ounces of alcohol per week. He reports that he does not use illicit drugs.  ROS: All systems are reviewed and negative except as noted.  Physical Exam:  Vital signs in last 24 hours: Temp:  [97.6 F (36.4 C)] 97.6 F (36.4 C) (02/21 1002) Pulse Rate:  [60] 60  (02/21 1002) Resp:  [20] 20  (02/21 1002) BP: (148)/(87) 148/87 mmHg (02/21 1002) SpO2:  [95 %] 95 % (02/21 1002) Weight:  [178 lb 7 oz (80.939 kg)] 178 lb 7 oz (80.939 kg)  (02/21 1002)  Cardiovascular: Skin warm; not flushed Respiratory: Breaths quiet; no shortness of breath Abdomen: No masses Neurological: Normal sensation to touch Musculoskeletal: Normal motor function arms and legs Lymphatics: No inguinal adenopathy Skin: No rashes Genitourinary:Penis and scrotal contents are within normal limits.  Rectal Examination: sphincter tone is normal.  Prostate is enlarged 40 gm  Laboratory Data:  No results found for this or any previous visit (from the past 24 hour(s)). No results found for this or any previous visit (from the past 240 hour(s)). Creatinine: No results found for this basename: CREATININE:7 in the last 168 hours  Impression/Assessment:  Right ureteral calculus  Plan:  ESL right ureteral calculus  [name] 07/18/2011, 12:00 PM

## 2011-07-19 ENCOUNTER — Encounter (HOSPITAL_COMMUNITY): Payer: Self-pay

## 2011-07-29 ENCOUNTER — Other Ambulatory Visit (INDEPENDENT_AMBULATORY_CARE_PROVIDER_SITE_OTHER): Payer: 59

## 2011-07-29 DIAGNOSIS — I10 Essential (primary) hypertension: Secondary | ICD-10-CM

## 2011-07-29 LAB — BASIC METABOLIC PANEL
BUN: 18 mg/dL (ref 6–23)
CO2: 29 mEq/L (ref 19–32)
Calcium: 9.3 mg/dL (ref 8.4–10.5)
Chloride: 103 mEq/L (ref 96–112)
Creatinine, Ser: 0.9 mg/dL (ref 0.4–1.5)

## 2011-07-30 ENCOUNTER — Ambulatory Visit: Payer: 59 | Admitting: Internal Medicine

## 2011-08-05 ENCOUNTER — Encounter: Payer: Self-pay | Admitting: Internal Medicine

## 2011-08-05 ENCOUNTER — Ambulatory Visit (INDEPENDENT_AMBULATORY_CARE_PROVIDER_SITE_OTHER): Payer: 59 | Admitting: Internal Medicine

## 2011-08-05 ENCOUNTER — Telehealth: Payer: Self-pay | Admitting: Internal Medicine

## 2011-08-05 DIAGNOSIS — I10 Essential (primary) hypertension: Secondary | ICD-10-CM

## 2011-08-05 DIAGNOSIS — K219 Gastro-esophageal reflux disease without esophagitis: Secondary | ICD-10-CM

## 2011-08-05 DIAGNOSIS — E785 Hyperlipidemia, unspecified: Secondary | ICD-10-CM

## 2011-08-05 DIAGNOSIS — J45909 Unspecified asthma, uncomplicated: Secondary | ICD-10-CM

## 2011-08-05 MED ORDER — BUDESONIDE-FORMOTEROL FUMARATE 160-4.5 MCG/ACT IN AERO: 2.0000 | INHALATION_SPRAY | Freq: Two times a day (BID) | RESPIRATORY_TRACT | Status: DC

## 2011-08-05 MED ORDER — PANTOPRAZOLE SODIUM 40 MG PO TBEC: 40.0000 mg | DELAYED_RELEASE_TABLET | Freq: Every day | ORAL | Status: DC

## 2011-08-05 MED ORDER — ATORVASTATIN CALCIUM 20 MG PO TABS: 20.0000 mg | ORAL_TABLET | Freq: Every day | ORAL | Status: DC

## 2011-08-05 NOTE — Telephone Encounter (Signed)
OK wellness - I asked to sch it in the "comments" section OK to fill his prescriptions with additional refills x12 Thank you!

## 2011-08-05 NOTE — Assessment & Plan Note (Signed)
Change to protonix  Due to cost

## 2011-08-05 NOTE — Assessment & Plan Note (Signed)
Change to Symbicort - due to cost

## 2011-08-05 NOTE — Telephone Encounter (Signed)
The pt stopped by the apt desk and stated he needed a cpe scheduled for the first part of April.  He stated none of his regular meds were refilled today during his visit.  The note on the appt stated for him to return in 4 months for just a follow up.  Please advise on when you want the patient to return.  Also, please advise what medications he needs refilled.    Thank you.

## 2011-08-05 NOTE — Assessment & Plan Note (Signed)
Continue with current prescription therapy as reflected on the Med list.  

## 2011-08-05 NOTE — Assessment & Plan Note (Signed)
Better - he is retiring 08/26/11

## 2011-08-05 NOTE — Progress Notes (Signed)
Patient ID: [name], male   DOB: [birth year], 66 y.o.   MRN: [id number]  Subjective:    Patient ID: [name], male    DOB: Nov 25, 1945, 66 y.o.   MRN: [id number]  HPI  HPI  C/o URI sx's x 14  days. C/o ST, cough, weakness. Not better with OTC medicines. Actually, the patient is getting better after Tamiflu (flu type A + test) and a Zpac. He lost voice 1 wk ago.  F/u HTN, syncope  Review of Systems  Constitutional: Positive for fever, chills and fatigue.  HENT: Positive for congestion, rhinorrhea, sneezing and postnasal drip - yellow d/c Eyes: Positive for photophobia and pain. Negative for discharge and visual disturbance.  Respiratory: Positive for cough and wheezing.   Positive for chest pain.  Gastrointestinal: Negative for vomiting, abdominal pain, diarrhea and abdominal distention.  Genitourinary: Negative for dysuria and difficulty urinating.  Skin: Negative for rash.  Neurological: Positive for dizziness, weakness and light-headedness.   Wt Readings from Last 3 Encounters:  08/05/11 179 lb (81.194 kg)  07/18/11 178 lb 7 oz (80.939 kg)  07/18/11 178 lb 7 oz (80.939 kg)   BP Readings from Last 3 Encounters:  08/05/11 142/88  07/18/11 132/81  07/18/11 132/81       Review of Systems     Objective:   Physical Exam  Constitutional: He is oriented to person, place, and time. He appears well-developed.  HENT:  Right Ear: External ear normal.  Left Ear: External ear normal.       eryth throat  Eyes: Conjunctivae are normal. Pupils are equal, round, and reactive to light.  Neck: Normal range of motion. No JVD present. No thyromegaly present.  Cardiovascular: Normal rate, regular rhythm, normal heart sounds and intact distal pulses.  Exam reveals no gallop and no friction rub.   No murmur heard. Pulmonary/Chest: Effort normal and breath sounds normal. No respiratory distress. He has no wheezes. He has no rales. He exhibits no tenderness.  Abdominal: Soft.  Bowel sounds are normal. He exhibits no distension and no mass. There is no tenderness. There is no rebound and no guarding.  Musculoskeletal: Normal range of motion. He exhibits no edema and no tenderness.  Lymphadenopathy:    He has no cervical adenopathy.  Neurological: He is alert and oriented to person, place, and time. He has normal reflexes. No cranial nerve deficit. He exhibits normal muscle tone. Coordination normal.  Skin: Skin is warm and dry. No rash noted.  Psychiatric: He has a normal mood and affect. His behavior is normal. Judgment and thought content normal.    Lab Results  Component Value Date   WBC 7.0 08/29/2010   HGB 15.6 03/26/2011   HCT 46.0 03/26/2011   PLT 263.0 08/29/2010   GLUCOSE 147* 07/29/2011   CHOL 129 08/29/2010   TRIG 58.0 08/29/2010   HDL 53.20 08/29/2010   LDLCALC 64 08/29/2010   ALT 27 02/22/2011   AST 27 02/22/2011   NA 139 07/29/2011   K 3.5 07/29/2011   CL 103 07/29/2011   CREATININE 0.9 07/29/2011   BUN 18 07/29/2011   CO2 29 07/29/2011   TSH 1.24 02/22/2011   PSA 1.33 08/29/2010   MICROALBUR 1.0 02/22/2011         Assessment & Plan:

## 2011-09-09 ENCOUNTER — Other Ambulatory Visit: Payer: Self-pay | Admitting: Internal Medicine

## 2011-10-15 ENCOUNTER — Other Ambulatory Visit: Payer: Self-pay | Admitting: Internal Medicine

## 2011-10-23 ENCOUNTER — Telehealth: Payer: Self-pay | Admitting: Internal Medicine

## 2011-10-23 NOTE — Telephone Encounter (Signed)
The pt's wife called and is hoping to get the pt in today for high blood sugar.      Thanks!

## 2011-10-23 NOTE — Telephone Encounter (Signed)
Can you add him on tomorrow?

## 2011-10-23 NOTE — Telephone Encounter (Signed)
Add-on to me tomorrow, if pt can wait.  If not (n/v/sob), needs to be seen today.

## 2011-10-23 NOTE — Telephone Encounter (Signed)
Spoke with Pt's wife, pt coming in tomorrow.

## 2011-10-24 ENCOUNTER — Ambulatory Visit (INDEPENDENT_AMBULATORY_CARE_PROVIDER_SITE_OTHER): Payer: Medicare Other | Admitting: Endocrinology

## 2011-10-24 ENCOUNTER — Encounter: Payer: Self-pay | Admitting: Endocrinology

## 2011-10-24 VITALS — BP 128/78 | HR 62 | Temp 98.0°F | Ht 64.0 in | Wt 163.0 lb

## 2011-10-24 DIAGNOSIS — IMO0001 Reserved for inherently not codable concepts without codable children: Secondary | ICD-10-CM

## 2011-10-24 DIAGNOSIS — R7309 Other abnormal glucose: Secondary | ICD-10-CM

## 2011-10-24 DIAGNOSIS — R739 Hyperglycemia, unspecified: Secondary | ICD-10-CM

## 2011-10-24 DIAGNOSIS — R350 Frequency of micturition: Secondary | ICD-10-CM

## 2011-10-24 LAB — GLUCOSE, POCT (MANUAL RESULT ENTRY)

## 2011-10-24 MED ORDER — PIOGLITAZONE HCL 45 MG PO TABS: 45.0000 mg | ORAL_TABLET | Freq: Every day | ORAL | Status: DC

## 2011-10-24 MED ORDER — GLIMEPIRIDE 4 MG PO TABS: 4.0000 mg | ORAL_TABLET | Freq: Every day | ORAL | Status: DC

## 2011-10-24 MED ORDER — GLUCOSE BLOOD VI STRP: 1.0000 | ORAL_STRIP | Freq: Every day | Status: DC

## 2011-10-24 NOTE — Patient Instructions (Addendum)
Reduce hctz to 1/2 pill per day. Please come back for a follow-up appointment in 4 days. Here are some samples of "kombliglyze.  Instructions are below i have sent 2 other prescriptions to your pharmacy. Here is a blood sugar meter. i have sent a prescription to your pharmacy, for strips check your blood sugar once a day.  vary the time of day when you check, between before the 3 meals, and at bedtime.  also check if you have symptoms of your blood sugar being too high or too low.  please keep a record of the readings and bring it to your next appointment here.  please call us sooner if your blood sugar goes below 70, or if it stays over 200.

## 2011-10-24 NOTE — Progress Notes (Signed)
Subjective:    Patient ID: [name], male    DOB: 13-Jan-1946, 66 y.o.   MRN: [id number]  HPI Pt reports few weeks of moderate polyuria, not worse in the context of sleep.  No numbness of the feet.  He was noted to have glycosuria.  No assoc n/v. Past Medical History  Diagnosis Date  . GERD (gastroesophageal reflux disease)   . Hyperlipidemia   . Hypertension   . Chronic kidney disease     kidney stone and infection- started 03-27-11  . Asthma     mild, chronic cough    Past Surgical History  Procedure Date  . Appendectomy 1963  . Cystoscopy w/ ureteral stent placement 03/26/11    removal of kidney stone    History   Social History  . Marital Status: Married    Spouse Name: N/A    Number of Children: 3  . Years of Education: N/A   Occupational History  . Professional    Social History Main Topics  . Smoking status: Former Smoker -- 1.5 packs/day for 40 years    Types: Cigarettes    Quit date: 05/27/2001  . Smokeless tobacco: Former Neurosurgeon  . Alcohol Use: 6.0 oz/week    10 Glasses of wine per week  . Drug Use: No  . Sexually Active: Yes   Other Topics Concern  . Not on file   Social History Narrative   Regular Exercise -  YES; riding    Current Outpatient Prescriptions on File Prior to Visit  Medication Sig Dispense Refill  . amLODipine (NORVASC) 10 MG tablet Take 5 mg by mouth daily before breakfast.      . atorvastatin (LIPITOR) 20 MG tablet Take 1 tablet (20 mg total) by mouth daily before breakfast. Generic please  30 tablet  11  . budesonide-formoterol (SYMBICORT) 160-4.5 MCG/ACT inhaler Inhale 2 puffs into the lungs 2 (two) times daily.  1 Inhaler  11  . Cholecalciferol (VITAMIN D3) 1000 UNITS tablet Take 1,000 Units by mouth daily.        . folic acid (FOLVITE) 1 MG tablet TAKE 2 TABLETS (2 MG TOTAL) BY MOUTH DAILY.  60 tablet  5  . hydrochlorothiazide (HYDRODIURIL) 25 MG tablet Take 12.5 mg by mouth daily before breakfast.       . nebivolol  (BYSTOLIC) 10 MG tablet Take 10 mg by mouth daily before breakfast.      . OVER THE COUNTER MEDICATION Take 1 tablet by mouth 2 (two) times daily. raspberry ketone      . pantoprazole (PROTONIX) 40 MG tablet Take 1 tablet (40 mg total) by mouth daily. Generic please  30 tablet  11  . Saxagliptin-Metformin (KOMBIGLYZE XR) 2.09-998 MG TB24 Take 1 tablet by mouth 2 (two) times daily.      . traMADol (ULTRAM) 50 MG tablet Take 50 mg by mouth every 6 (six) hours as needed. Pain      . albuterol (PROAIR HFA) 108 (90 BASE) MCG/ACT inhaler Inhale 2 puffs into the lungs every 6 (six) hours as needed for wheezing.  1 Inhaler  6  . glimepiride (AMARYL) 4 MG tablet Take 1 tablet (4 mg total) by mouth daily before breakfast.  30 tablet  3  . pioglitazone (ACTOS) 45 MG tablet Take 1 tablet (45 mg total) by mouth daily.  30 tablet  11  . DISCONTD: mometasone-formoterol (DULERA) 100-5 MCG/ACT AERO Inhale 2 puffs into the lungs every morning.        Allergies  Allergen Reactions  . Olmesartan     hives    Family History  Problem Relation Age of Onset  . Hypertension Other   . Coronary artery disease Other   . Heart disease Father 57    MI  . Heart disease Sister 63    CAD  . Multiple sclerosis Mother   . Parkinsonism Brother   . Colon cancer Neg Hx   . Esophageal cancer Neg Hx   . Stomach cancer Neg Hx     BP 128/78  Pulse 62  Temp(Src) 98 F (36.7 C) (Oral)  Ht 5\' 4"  (1.626 m)  Wt 163 lb (73.936 kg)  BMI 27.98 kg/m2  SpO2 96%    Review of Systems He has lost 16 lbs x 2 mos.  Denies sob.      Objective:   Physical Exam VITAL SIGNS:  See vs page GENERAL: no distress Pulses: dorsalis pedis intact bilat.   Feet: no deformity.  no ulcer on the feet.  feet are of normal color and temp.  no edema Neuro: sensation is intact to touch on the feet   Lab Results  Component Value Date   WBC 7.0 08/29/2010   HGB 15.6 03/26/2011   HCT 46.0 03/26/2011   PLT 263.0 08/29/2010   GLUCOSE 147*  07/29/2011   CHOL 129 08/29/2010   TRIG 58.0 08/29/2010   HDL 53.20 08/29/2010   LDLCALC 64 08/29/2010   ALT 27 02/22/2011   AST 27 02/22/2011   NA 139 07/29/2011   K 3.5 07/29/2011   CL 103 07/29/2011   CREATININE 0.9 07/29/2011   BUN 18 07/29/2011   CO2 29 07/29/2011   TSH 1.24 02/22/2011   PSA 1.33 08/29/2010   MICROALBUR 1.0 02/22/2011      Assessment & Plan:  Dm.  Poor control HTN.  hctz may exacerbate hyperglycemia Urinary sxs, prob due to severe hyperglycemia

## 2011-10-28 ENCOUNTER — Ambulatory Visit: Payer: Medicare Other | Admitting: Endocrinology

## 2011-10-28 ENCOUNTER — Encounter: Payer: Self-pay | Admitting: Internal Medicine

## 2011-10-28 ENCOUNTER — Ambulatory Visit (INDEPENDENT_AMBULATORY_CARE_PROVIDER_SITE_OTHER): Payer: Medicare Other | Admitting: Internal Medicine

## 2011-10-28 VITALS — BP 118/82 | HR 80 | Temp 97.4°F | Resp 16 | Wt 167.0 lb

## 2011-10-28 DIAGNOSIS — R55 Syncope and collapse: Secondary | ICD-10-CM

## 2011-10-28 DIAGNOSIS — I10 Essential (primary) hypertension: Secondary | ICD-10-CM

## 2011-10-28 DIAGNOSIS — R3589 Other polyuria: Secondary | ICD-10-CM | POA: Insufficient documentation

## 2011-10-28 DIAGNOSIS — IMO0001 Reserved for inherently not codable concepts without codable children: Secondary | ICD-10-CM

## 2011-10-28 DIAGNOSIS — R358 Other polyuria: Secondary | ICD-10-CM

## 2011-10-28 NOTE — Patient Instructions (Signed)
Agave syrup Taper off and stop Glimepiride if possible

## 2011-10-28 NOTE — Assessment & Plan Note (Signed)
No relapse 

## 2011-10-28 NOTE — Assessment & Plan Note (Signed)
Better now Continue with current prescription therapy as reflected on the Med list.  

## 2011-10-28 NOTE — Progress Notes (Signed)
Patient ID: [name], male   DOB: 1946/04/08, 66 y.o.   MRN: [id number]  Subjective:    Patient ID: [name], male    DOB: 1946/04/16, 66 y.o.   MRN: [id number]  HPI F/u a new onset of DM2 - decompensated; better now with Rx prescribed by Dr Everardo All. CBGs 130-150 lately; urinating less The patient presents for a follow-up of  chronic hypertension, chronic dyslipidemia, hydronephrosis controlled with medicines  Wt Readings from Last 3 Encounters:  10/28/11 167 lb (75.751 kg)  10/24/11 163 lb (73.936 kg)  08/05/11 179 lb (81.194 kg)   BP Readings from Last 3 Encounters:  10/28/11 118/82  10/24/11 128/78  08/05/11 142/88        Review of Systems  Constitutional: Negative for appetite change, fatigue and unexpected weight change.  HENT: Negative for nosebleeds, congestion, sore throat, sneezing, trouble swallowing and neck pain.   Eyes: Negative for itching and visual disturbance.  Respiratory: Negative for cough.   Cardiovascular: Negative for chest pain, palpitations and leg swelling.  Gastrointestinal: Negative for diarrhea, blood in stool and abdominal distention.  Musculoskeletal: Negative for back pain, joint swelling and gait problem.  Skin: Negative for rash.  Neurological: Negative for dizziness, tremors, speech difficulty and weakness.  Psychiatric/Behavioral: Negative for sleep disturbance, dysphoric mood and agitation. The patient is not nervous/anxious.        Objective:   Physical Exam  Constitutional: He is oriented to person, place, and time. He appears well-developed.  HENT:  Mouth/Throat: Oropharynx is clear and moist.  Eyes: Conjunctivae are normal. Pupils are equal, round, and reactive to light.  Neck: Normal range of motion. No JVD present. No thyromegaly present.  Cardiovascular: Normal rate, regular rhythm, normal heart sounds and intact distal pulses.  Exam reveals no gallop and no friction rub.   No murmur heard. Pulmonary/Chest:  Effort normal and breath sounds normal. No respiratory distress. He has no wheezes. He has no rales. He exhibits no tenderness.  Abdominal: Soft. Bowel sounds are normal. He exhibits no distension and no mass. There is no tenderness. There is no rebound and no guarding.  Musculoskeletal: Normal range of motion. He exhibits no edema and no tenderness.  Lymphadenopathy:    He has no cervical adenopathy.  Neurological: He is alert and oriented to person, place, and time. He has normal reflexes. No cranial nerve deficit. He exhibits normal muscle tone. Coordination normal.  Skin: Skin is warm and dry. No rash noted.  Psychiatric: He has a normal mood and affect. His behavior is normal. Judgment and thought content normal.   Lab Results  Component Value Date   WBC 7.0 08/29/2010   HGB 15.6 03/26/2011   HCT 46.0 03/26/2011   PLT 263.0 08/29/2010   GLUCOSE 147* 07/29/2011   CHOL 129 08/29/2010   TRIG 58.0 08/29/2010   HDL 53.20 08/29/2010   LDLCALC 64 08/29/2010   ALT 27 02/22/2011   AST 27 02/22/2011   NA 139 07/29/2011   K 3.5 07/29/2011   CL 103 07/29/2011   CREATININE 0.9 07/29/2011   BUN 18 07/29/2011   CO2 29 07/29/2011   TSH 1.24 02/22/2011   PSA 1.33 08/29/2010   MICROALBUR 1.0 02/22/2011          Assessment & Plan:

## 2011-10-28 NOTE — Assessment & Plan Note (Signed)
Improved

## 2011-10-28 NOTE — Assessment & Plan Note (Signed)
5/13 Better  Cont meds/diet Diab ed consult

## 2011-11-04 ENCOUNTER — Telehealth: Payer: Self-pay | Admitting: Internal Medicine

## 2011-11-04 NOTE — Telephone Encounter (Signed)
Caller: Cindy/Spouse; PCP: Plotnikov, Alex; CB#: [phone]; ; ; Call regarding Can Patient Take Otc Folic Acid Or Does It Have To Be Prescription Strength;   Dr Kennedy Bucker recommended for pt to start taking Folic Acid 1 mg  BID and rx was given  . Pt wants to know if he can take  2  tablets of OTC Folic Acid that is  0.8 mg / tablet because it is cheaper. -- OFFICE FU REQUEST

## 2011-11-04 NOTE — Telephone Encounter (Signed)
Sure, no problem Thx

## 2011-11-11 NOTE — Telephone Encounter (Signed)
Pt's spouse informed of MD's advisement.  

## 2011-11-15 ENCOUNTER — Encounter: Payer: Self-pay | Admitting: Dietician

## 2011-11-15 ENCOUNTER — Encounter: Payer: Medicare Other | Attending: Internal Medicine | Admitting: Dietician

## 2011-11-15 VITALS — Ht 64.0 in | Wt 168.8 lb

## 2011-11-15 DIAGNOSIS — Z713 Dietary counseling and surveillance: Secondary | ICD-10-CM | POA: Insufficient documentation

## 2011-11-15 DIAGNOSIS — E119 Type 2 diabetes mellitus without complications: Secondary | ICD-10-CM | POA: Insufficient documentation

## 2011-11-15 DIAGNOSIS — IMO0001 Reserved for inherently not codable concepts without codable children: Secondary | ICD-10-CM

## 2011-11-15 NOTE — Progress Notes (Signed)
  Medical Nutrition Therapy:  Appt start time: 1000 end time:  1130.   Assessment:  Primary concerns today: New onset DM2. He and his wife come today to learn how they Alyene Predmore take care of his health.  His stated goal is to lose more weight and the take as few medications as possible.  Since his diagnosis of diabetes, he notes he has lost about 18 lbs.  On diagnosis, his blood glucose was approximately 460 mg.  Since then it is decreased and he is feeling much better.  He does still c/o some issues with blurry vision.  BLOOD GLUCOSE:  Fasting: 110-135 mg average.  HYPOGLYCEMIA: Gives no history of signs and symptoms.  Review of the S/S and treatment of hypoglycemia.  MEDICATIONS: Current diabetes medications include Actos 45 mg/day and Kombiglyze XR 2.09/998 daily.   DIETARY INTAKE:  Usual eating pattern includes 2-3 meals and fruit snacks per day.  Everyday foods include meat and vegetables.  Avoided foods include snack foods.    24-hr recall:  B ( AM): 8:00 coffee with Splenda. 9:00 sausage biscuit or 2 slices of toast with margarine, and eggs, and maybe a peach or other fruit.  Maybe an occasional small glass of OJ.  Snk ( AM): none  L ( PM): Sandwich with 2 slices of white bread with mayo and (chicken, [place], tomato, pimento cheese or egg salad) sometimes not often chips, more often a piece of fruit. Snk ( PM): none D ( PM): 3 glasses of a dry wine.  1/2 of egg salad sandwich and a serving of leftover chicken and dumplings. OR salmon on grill with a baked potato, salad or steamed broccoli Snk ( PM): none Beverages: coffee, wine, tea (half sweetened/half unsweetened), orange juice.  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  Does do some horseback riding at times.  No set exercise regimen.  Estimated energy needs:HT: 64 in  WT: 168,8 lb  BMI:29 kg/m2  Adj WT:145 lb (66 kg) 1600-1800 calories 190-195 g carbohydrates 125-130  g protein 46-48 g fat  Progress Towards Goal(s):  In progress.   Nutritional Diagnosis:  Bearden-2.1 Inpaired nutrition utilization As related to blood glucose.  As evidenced by diagnosis of type 2 diabetes, controlled with 2 medications and  decreased CHO intake..    Intervention:  Nutrition Review of the carb restricted diet for blood glucose control and other self-management measures.  Handouts given during visit include:  Living Well With Diabetes  Carbohydrate Counting Guide  Measures for controlling blood glucose  Yellow card meal prescription  Monitoring/Evaluation:  Dietary intake, exercise, blood glucose, and body weight as needed.  To call of e-mail with questions and make future appointment if desires.

## 2011-11-20 ENCOUNTER — Encounter: Payer: Self-pay | Admitting: Internal Medicine

## 2011-11-20 ENCOUNTER — Ambulatory Visit (INDEPENDENT_AMBULATORY_CARE_PROVIDER_SITE_OTHER): Payer: Medicare Other | Admitting: Internal Medicine

## 2011-11-20 VITALS — BP 130/72 | HR 76 | Temp 97.6°F | Resp 16 | Ht 64.0 in | Wt 171.0 lb

## 2011-11-20 DIAGNOSIS — N320 Bladder-neck obstruction: Secondary | ICD-10-CM

## 2011-11-20 DIAGNOSIS — E785 Hyperlipidemia, unspecified: Secondary | ICD-10-CM

## 2011-11-20 DIAGNOSIS — I10 Essential (primary) hypertension: Secondary | ICD-10-CM

## 2011-11-20 DIAGNOSIS — R3589 Other polyuria: Secondary | ICD-10-CM

## 2011-11-20 DIAGNOSIS — IMO0001 Reserved for inherently not codable concepts without codable children: Secondary | ICD-10-CM

## 2011-11-20 DIAGNOSIS — K219 Gastro-esophageal reflux disease without esophagitis: Secondary | ICD-10-CM

## 2011-11-20 DIAGNOSIS — Z0000 Encounter for general adult medical examination without abnormal findings: Secondary | ICD-10-CM

## 2011-11-20 DIAGNOSIS — R358 Other polyuria: Secondary | ICD-10-CM

## 2011-11-20 MED ORDER — ONETOUCH ULTRASOFT LANCETS MISC: Status: DC

## 2011-11-20 MED ORDER — CARVEDILOL 25 MG PO TABS: 25.0000 mg | ORAL_TABLET | Freq: Two times a day (BID) | ORAL | Status: DC

## 2011-11-20 MED ORDER — GLUCOSE BLOOD VI STRP: 1.0000 | ORAL_STRIP | Freq: Two times a day (BID) | Status: DC | PRN

## 2011-11-20 NOTE — Assessment & Plan Note (Signed)
resolved 

## 2011-11-20 NOTE — Assessment & Plan Note (Signed)
Better  Continue with current prescription therapy as reflected on the Med list. Actos 1/2 tab/d Labs in 3 mo

## 2011-11-20 NOTE — Assessment & Plan Note (Signed)
Better  

## 2011-11-20 NOTE — Assessment & Plan Note (Signed)
Labs in 3 mo

## 2011-11-20 NOTE — Patient Instructions (Addendum)
Actos 45 mg 1/2 tab a day

## 2011-11-20 NOTE — Assessment & Plan Note (Signed)
The patient is here for annual Medicare wellness examination and management of other chronic and acute problems.   The risk factors are reflected in the social history.  The roster of all physicians providing medical care to patient - is listed in the Snapshot section of the chart.  Activities of daily living:  The patient is 100% inedpendent in all ADLs: dressing, toileting, feeding as well as independent mobility  Home safety : The patient has smoke detectors in the home. They wear seatbelts. There is no violence in the home.   There is no risks for hepatitis, STDs or HIV. There is no   history of blood transfusion. They have no travel history to infectious disease endemic areas of the world.  The patient has  seen their dentist in the last 12 month. They have  seen their eye doctor in the last year. He is using in-ear hearing aids had audiologic testing in the last year.  They do not  have excessive sun exposure. Discussed the need for sun protection: hats, long sleeves and use of sunscreen if there is significant sun exposure.   Diet: the importance of a healthy diet is discussed. They do have a reasonably healthy  diet.  The patient has a fairly regular exercise program of a mixed nature: walking, yard work, etc.The benefits of regular aerobic exercise were discussed.  Depression screen: there are no signs or vegative symptoms of depression- irritability, change in appetite, anhedonia, sadness/tearfullness.  Cognitive assessment: the patient manages all their financial and personal affairs and is actively engaged. They could relate day,date,year and events; recalled 3/3 objects at 3 minutes  The following portions of the patient's history were reviewed and updated as appropriate: allergies, current medications, past family history, past medical history,  past surgical history, past social history  and problem list.  Vision, hearing, body mass index were assessed and reviewed.   During  the course of the visit the patient was educated and counseled about appropriate screening and preventive services including : fall prevention , diabetes screening, nutrition counseling, colorectal cancer screening, and recommended immunizations. Eye exam in 1-2 mo

## 2011-11-20 NOTE — Assessment & Plan Note (Signed)
He can try Protonix prn

## 2011-11-20 NOTE — Progress Notes (Signed)
Subjective:    Patient ID: [name], male    DOB: [birth year], 66 y.o.   MRN: [id number]  HPI  The patient is here for a wellness exam. The patient has been doing well overall without major physical or psychological issues going on lately, except for DM.  F/u a new onset of DM2 - decompensated; better now with Rx prescribed  CBGs 130-150 lately; urinating less The patient presents for a follow-up of  chronic hypertension, chronic dyslipidemia, hydronephrosis controlled with medicines   Wt Readings from Last 3 Encounters:  11/20/11 171 lb (77.565 kg)  11/15/11 168 lb 12.8 oz (76.567 kg)  10/28/11 167 lb (75.751 kg)   BP Readings from Last 3 Encounters:  11/20/11 130/72  10/28/11 118/82  10/24/11 128/78        Review of Systems  Constitutional: Negative for appetite change, fatigue and unexpected weight change.  HENT: Negative for nosebleeds, congestion, sore throat, sneezing, trouble swallowing and neck pain.   Eyes: Negative for itching and visual disturbance.  Respiratory: Negative for cough.   Cardiovascular: Negative for chest pain, palpitations and leg swelling.  Gastrointestinal: Negative for diarrhea, blood in stool and abdominal distention.  Musculoskeletal: Negative for back pain, joint swelling and gait problem.  Skin: Negative for rash.  Neurological: Negative for dizziness, tremors, speech difficulty and weakness.  Psychiatric/Behavioral: Negative for disturbed wake/sleep cycle, dysphoric mood and agitation. The patient is not nervous/anxious.        Objective:   Physical Exam  Constitutional: He is oriented to person, place, and time. He appears well-developed.  HENT:  Mouth/Throat: Oropharynx is clear and moist.       Hearing aids in-ear  Eyes: Conjunctivae are normal. Pupils are equal, round, and reactive to light.  Neck: Normal range of motion. No JVD present. No thyromegaly present.  Cardiovascular: Normal rate, regular rhythm, normal heart  sounds and intact distal pulses.  Exam reveals no gallop and no friction rub.   No murmur heard. Pulmonary/Chest: Effort normal and breath sounds normal. No respiratory distress. He has no wheezes. He has no rales. He exhibits no tenderness.  Abdominal: Soft. Bowel sounds are normal. He exhibits no distension and no mass. There is no tenderness. There is no rebound and no guarding.  Genitourinary: Rectum normal, prostate normal and penis normal. Guaiac negative stool.  Musculoskeletal: Normal range of motion. He exhibits no edema and no tenderness.  Lymphadenopathy:    He has no cervical adenopathy.  Neurological: He is alert and oriented to person, place, and time. He has normal reflexes. No cranial nerve deficit. He exhibits normal muscle tone. Coordination normal.  Skin: Skin is warm and dry. No rash noted.  Psychiatric: He has a normal mood and affect. His behavior is normal. Judgment and thought content normal.   Lab Results  Component Value Date   WBC 7.0 08/29/2010   HGB 15.6 03/26/2011   HCT 46.0 03/26/2011   PLT 263.0 08/29/2010   GLUCOSE 147* 07/29/2011   CHOL 129 08/29/2010   TRIG 58.0 08/29/2010   HDL 53.20 08/29/2010   LDLCALC 64 08/29/2010   ALT 27 02/22/2011   AST 27 02/22/2011   NA 139 07/29/2011   K 3.5 07/29/2011   CL 103 07/29/2011   CREATININE 0.9 07/29/2011   BUN 18 07/29/2011   CO2 29 07/29/2011   TSH 1.24 02/22/2011   PSA 1.33 08/29/2010   MICROALBUR 1.0 02/22/2011          Assessment & Plan:  Labs in 3 mo

## 2012-01-29 ENCOUNTER — Other Ambulatory Visit: Payer: Self-pay | Admitting: *Deleted

## 2012-01-29 MED ORDER — SAXAGLIPTIN-METFORMIN ER 2.5-1000 MG PO TB24: 1.0000 | ORAL_TABLET | Freq: Two times a day (BID) | ORAL | Status: DC

## 2012-02-05 ENCOUNTER — Other Ambulatory Visit: Payer: Self-pay | Admitting: *Deleted

## 2012-02-05 MED ORDER — HYDROCHLOROTHIAZIDE 25 MG PO TABS: 12.5000 mg | ORAL_TABLET | Freq: Every day | ORAL | Status: DC

## 2012-02-13 ENCOUNTER — Other Ambulatory Visit (INDEPENDENT_AMBULATORY_CARE_PROVIDER_SITE_OTHER): Payer: Medicare Other

## 2012-02-13 DIAGNOSIS — N320 Bladder-neck obstruction: Secondary | ICD-10-CM

## 2012-02-13 DIAGNOSIS — IMO0001 Reserved for inherently not codable concepts without codable children: Secondary | ICD-10-CM

## 2012-02-13 DIAGNOSIS — K219 Gastro-esophageal reflux disease without esophagitis: Secondary | ICD-10-CM

## 2012-02-13 DIAGNOSIS — Z125 Encounter for screening for malignant neoplasm of prostate: Secondary | ICD-10-CM

## 2012-02-13 DIAGNOSIS — Z0000 Encounter for general adult medical examination without abnormal findings: Secondary | ICD-10-CM

## 2012-02-13 DIAGNOSIS — E785 Hyperlipidemia, unspecified: Secondary | ICD-10-CM

## 2012-02-13 LAB — URINALYSIS, ROUTINE W REFLEX MICROSCOPIC
Specific Gravity, Urine: 1.025 (ref 1.000–1.030)
Total Protein, Urine: NEGATIVE
Urine Glucose: NEGATIVE

## 2012-02-13 LAB — LIPID PANEL
Total CHOL/HDL Ratio: 2
Triglycerides: 117 mg/dL (ref 0.0–149.0)

## 2012-02-13 LAB — HEPATIC FUNCTION PANEL
AST: 22 U/L (ref 0–37)
Alkaline Phosphatase: 60 U/L (ref 39–117)
Bilirubin, Direct: 0.2 mg/dL (ref 0.0–0.3)
Total Bilirubin: 0.7 mg/dL (ref 0.3–1.2)

## 2012-02-13 LAB — BASIC METABOLIC PANEL
CO2: 28 mEq/L (ref 19–32)
Calcium: 9.7 mg/dL (ref 8.4–10.5)
Creatinine, Ser: 1 mg/dL (ref 0.4–1.5)
GFR: 78.14 mL/min (ref >60.00)
Glucose, Bld: 109 mg/dL — ABNORMAL HIGH (ref 70–99)

## 2012-02-13 LAB — CBC WITH DIFFERENTIAL/PLATELET
Basophils Absolute: 0 10*3/uL (ref 0.0–0.1)
Eosinophils Absolute: 0.3 10*3/uL (ref 0.0–0.7)
MCHC: 35.6 g/dL (ref 30.0–36.0)
MCV: 96.8 fl (ref 78.0–100.0)
Monocytes Absolute: 0.9 10*3/uL (ref 0.1–1.0)
Neutrophils Relative %: 57.1 % (ref 43.0–77.0)
Platelets: 203 10*3/uL (ref 150.0–400.0)

## 2012-02-21 ENCOUNTER — Ambulatory Visit (INDEPENDENT_AMBULATORY_CARE_PROVIDER_SITE_OTHER): Payer: Medicare Other | Admitting: Internal Medicine

## 2012-02-21 ENCOUNTER — Encounter: Payer: Self-pay | Admitting: Internal Medicine

## 2012-02-21 VITALS — BP 118/72 | HR 80 | Temp 97.7°F | Resp 16 | Wt 173.5 lb

## 2012-02-21 DIAGNOSIS — Z23 Encounter for immunization: Secondary | ICD-10-CM

## 2012-02-21 DIAGNOSIS — E119 Type 2 diabetes mellitus without complications: Secondary | ICD-10-CM

## 2012-02-21 DIAGNOSIS — I10 Essential (primary) hypertension: Secondary | ICD-10-CM

## 2012-02-21 DIAGNOSIS — IMO0001 Reserved for inherently not codable concepts without codable children: Secondary | ICD-10-CM

## 2012-02-21 DIAGNOSIS — R55 Syncope and collapse: Secondary | ICD-10-CM

## 2012-02-21 DIAGNOSIS — K219 Gastro-esophageal reflux disease without esophagitis: Secondary | ICD-10-CM

## 2012-02-21 NOTE — Progress Notes (Signed)
Subjective:    Patient ID: [name], male    DOB: 1946-03-04, 66 y.o.   MRN: [id number]  HPI   The patient has been doing well overall without major physical or psychological issues going on lately, except for insomnia.  F/u a new onset of DM2 - decompensated; better now with Rx prescribed  CBGs ok lately; urinating less The patient presents for a follow-up of  chronic hypertension, chronic dyslipidemia, hydronephrosis controlled with medicines   Wt Readings from Last 3 Encounters:  02/21/12 173 lb 8 oz (78.699 kg)  11/20/11 171 lb (77.565 kg)  11/15/11 168 lb 12.8 oz (76.567 kg)   BP Readings from Last 3 Encounters:  02/21/12 118/72  11/20/11 130/72  10/28/11 118/82        Review of Systems  Constitutional: Negative for appetite change, fatigue and unexpected weight change.  HENT: Negative for nosebleeds, congestion, sore throat, sneezing, trouble swallowing and neck pain.   Eyes: Negative for itching and visual disturbance.  Respiratory: Negative for cough.   Cardiovascular: Negative for chest pain, palpitations and leg swelling.  Gastrointestinal: Negative for diarrhea, blood in stool and abdominal distention.  Musculoskeletal: Negative for back pain, joint swelling and gait problem.  Skin: Negative for rash.  Neurological: Negative for dizziness, tremors, speech difficulty and weakness.  Psychiatric/Behavioral: Negative for disturbed wake/sleep cycle, dysphoric mood and agitation. The patient is not nervous/anxious.        Objective:   Physical Exam  Constitutional: He is oriented to person, place, and time. He appears well-developed.  HENT:  Mouth/Throat: Oropharynx is clear and moist.       Hearing aids in-ear  Eyes: Conjunctivae normal are normal. Pupils are equal, round, and reactive to light.  Neck: Normal range of motion. No JVD present. No thyromegaly present.  Cardiovascular: Normal rate, regular rhythm, normal heart sounds and intact distal  pulses.  Exam reveals no gallop and no friction rub.   No murmur heard. Pulmonary/Chest: Effort normal and breath sounds normal. No respiratory distress. He has no wheezes. He has no rales. He exhibits no tenderness.  Abdominal: Soft. Bowel sounds are normal. He exhibits no distension and no mass. There is no tenderness. There is no rebound and no guarding.  Genitourinary: Rectum normal, prostate normal and penis normal. Guaiac negative stool.  Musculoskeletal: Normal range of motion. He exhibits no edema and no tenderness.  Lymphadenopathy:    He has no cervical adenopathy.  Neurological: He is alert and oriented to person, place, and time. He has normal reflexes. No cranial nerve deficit. He exhibits normal muscle tone. Coordination normal.  Skin: Skin is warm and dry. No rash noted.  Psychiatric: He has a normal mood and affect. His behavior is normal. Judgment and thought content normal.   Lab Results  Component Value Date   WBC 6.8 02/13/2012   HGB 15.4 02/13/2012   HCT 43.1 02/13/2012   PLT 203.0 02/13/2012   GLUCOSE 109* 02/13/2012   CHOL 180 02/13/2012   TRIG 117.0 02/13/2012   HDL 87.50 02/13/2012   LDLCALC 69 02/13/2012   ALT 18 02/13/2012   AST 22 02/13/2012   NA 137 02/13/2012   K 4.3 02/13/2012   CL 100 02/13/2012   CREATININE 1.0 02/13/2012   BUN 21 02/13/2012   CO2 28 02/13/2012   TSH 1.41 02/13/2012   PSA 0.78 02/13/2012   HGBA1C 6.4 02/13/2012   MICROALBUR 1.0 02/22/2011          Assessment & Plan:

## 2012-02-21 NOTE — Assessment & Plan Note (Signed)
Doing well  D/c HCTZ

## 2012-02-21 NOTE — Assessment & Plan Note (Signed)
Better  Reduce Actos to 1/2 tab qod

## 2012-02-21 NOTE — Assessment & Plan Note (Signed)
Continue with current prescription therapy as reflected on the Med list.  

## 2012-02-21 NOTE — Assessment & Plan Note (Signed)
No relapse 

## 2012-06-12 ENCOUNTER — Other Ambulatory Visit: Payer: Self-pay | Admitting: Internal Medicine

## 2012-06-22 ENCOUNTER — Other Ambulatory Visit: Payer: Self-pay | Admitting: *Deleted

## 2012-06-22 MED ORDER — GLUCOSE BLOOD VI STRP: 1.0000 | ORAL_STRIP | Freq: Two times a day (BID) | Status: DC | PRN

## 2012-06-26 ENCOUNTER — Other Ambulatory Visit: Payer: Self-pay | Admitting: *Deleted

## 2012-06-26 MED ORDER — ONETOUCH ULTRASOFT LANCETS MISC: Status: AC

## 2012-06-26 MED ORDER — GLUCOSE BLOOD VI STRP: 1.0000 | ORAL_STRIP | Freq: Two times a day (BID) | Status: DC | PRN

## 2012-07-07 ENCOUNTER — Other Ambulatory Visit (INDEPENDENT_AMBULATORY_CARE_PROVIDER_SITE_OTHER): Payer: Medicare Other

## 2012-07-07 DIAGNOSIS — Z23 Encounter for immunization: Secondary | ICD-10-CM

## 2012-07-07 DIAGNOSIS — E119 Type 2 diabetes mellitus without complications: Secondary | ICD-10-CM

## 2012-07-07 LAB — BASIC METABOLIC PANEL
BUN: 12 mg/dL (ref 6–23)
Calcium: 9 mg/dL (ref 8.4–10.5)
Chloride: 103 mEq/L (ref 96–112)
Creatinine, Ser: 0.8 mg/dL (ref 0.4–1.5)
GFR: 99.26 mL/min (ref >60.00)

## 2012-07-07 LAB — HEMOGLOBIN A1C: Hgb A1c MFr Bld: 6.3 % (ref 4.6–6.5)

## 2012-07-13 ENCOUNTER — Ambulatory Visit (INDEPENDENT_AMBULATORY_CARE_PROVIDER_SITE_OTHER): Payer: Medicare Other | Admitting: Internal Medicine

## 2012-07-13 ENCOUNTER — Encounter: Payer: Self-pay | Admitting: Internal Medicine

## 2012-07-13 VITALS — BP 138/88 | HR 80 | Temp 97.6°F | Resp 16 | Wt 182.0 lb

## 2012-07-13 DIAGNOSIS — R55 Syncope and collapse: Secondary | ICD-10-CM

## 2012-07-13 DIAGNOSIS — I10 Essential (primary) hypertension: Secondary | ICD-10-CM

## 2012-07-13 DIAGNOSIS — E785 Hyperlipidemia, unspecified: Secondary | ICD-10-CM

## 2012-07-13 DIAGNOSIS — J45909 Unspecified asthma, uncomplicated: Secondary | ICD-10-CM

## 2012-07-13 DIAGNOSIS — K219 Gastro-esophageal reflux disease without esophagitis: Secondary | ICD-10-CM

## 2012-07-13 DIAGNOSIS — IMO0001 Reserved for inherently not codable concepts without codable children: Secondary | ICD-10-CM

## 2012-07-13 NOTE — Progress Notes (Signed)
Patient ID: [name], male   DOB: 1946-03-31, 67 y.o.   MRN: [id number]   Subjective:    Patient ID: [name], male    DOB: January 07, 1946, 67 y.o.   MRN: [id number]  HPI   The patient has been doing well overall without major physical or psychological issues going on lately, except for insomnia.  F/u a new onset of DM2 - decompensated; better now with Rx prescribed  CBGs ok lately; urinating less The patient presents for a follow-up of  chronic hypertension, chronic dyslipidemia, hydronephrosis controlled with medicines   Wt Readings from Last 3 Encounters:  07/13/12 182 lb (82.555 kg)  02/21/12 173 lb 8 oz (78.699 kg)  11/20/11 171 lb (77.565 kg)   BP Readings from Last 3 Encounters:  07/13/12 138/88  02/21/12 118/72  11/20/11 130/72        Review of Systems  Constitutional: Negative for appetite change, fatigue and unexpected weight change.  HENT: Negative for nosebleeds, congestion, sore throat, sneezing, trouble swallowing and neck pain.   Eyes: Negative for itching and visual disturbance.  Respiratory: Negative for cough.   Cardiovascular: Negative for chest pain, palpitations and leg swelling.  Gastrointestinal: Negative for diarrhea, blood in stool and abdominal distention.  Musculoskeletal: Negative for back pain, joint swelling and gait problem.  Skin: Negative for rash.  Neurological: Negative for dizziness, tremors, speech difficulty and weakness.  Psychiatric/Behavioral: Negative for sleep disturbance, dysphoric mood and agitation. The patient is not nervous/anxious.        Objective:   Physical Exam  Constitutional: He is oriented to person, place, and time. He appears well-developed.  HENT:  Mouth/Throat: Oropharynx is clear and moist.  Hearing aids in-ear  Eyes: Conjunctivae are normal. Pupils are equal, round, and reactive to light.  Neck: Normal range of motion. No JVD present. No thyromegaly present.  Cardiovascular: Normal rate,  regular rhythm, normal heart sounds and intact distal pulses.  Exam reveals no gallop and no friction rub.   No murmur heard. Pulmonary/Chest: Effort normal and breath sounds normal. No respiratory distress. He has no wheezes. He has no rales. He exhibits no tenderness.  Abdominal: Soft. Bowel sounds are normal. He exhibits no distension and no mass. There is no tenderness. There is no rebound and no guarding.  Genitourinary: Rectum normal, prostate normal and penis normal. Guaiac negative stool.  Musculoskeletal: Normal range of motion. He exhibits no edema and no tenderness.  Lymphadenopathy:    He has no cervical adenopathy.  Neurological: He is alert and oriented to person, place, and time. He has normal reflexes. No cranial nerve deficit. He exhibits normal muscle tone. Coordination normal.  Skin: Skin is warm and dry. No rash noted.  Psychiatric: He has a normal mood and affect. His behavior is normal. Judgment and thought content normal.   Lab Results  Component Value Date   WBC 6.8 02/13/2012   HGB 15.4 02/13/2012   HCT 43.1 02/13/2012   PLT 203.0 02/13/2012   GLUCOSE 111* 07/07/2012   CHOL 180 02/13/2012   TRIG 117.0 02/13/2012   HDL 87.50 02/13/2012   LDLCALC 69 02/13/2012   ALT 18 02/13/2012   AST 22 02/13/2012   NA 137 07/07/2012   K 3.8 07/07/2012   CL 103 07/07/2012   CREATININE 0.8 07/07/2012   BUN 12 07/07/2012   CO2 27 07/07/2012   TSH 1.41 02/13/2012   PSA 0.78 02/13/2012   HGBA1C 6.3 07/07/2012   MICROALBUR 1.0 02/22/2011  Assessment & Plan:

## 2012-07-13 NOTE — Assessment & Plan Note (Signed)
Continue with current prescription therapy as reflected on the Med list.  

## 2012-07-13 NOTE — Assessment & Plan Note (Signed)
No relapse 

## 2012-07-13 NOTE — Assessment & Plan Note (Signed)
Doing well 

## 2012-07-21 ENCOUNTER — Other Ambulatory Visit: Payer: Self-pay | Admitting: *Deleted

## 2012-07-21 MED ORDER — AMLODIPINE BESYLATE 10 MG PO TABS: 10.0000 mg | ORAL_TABLET | Freq: Every day | ORAL | Status: DC

## 2012-09-03 ENCOUNTER — Other Ambulatory Visit: Payer: Self-pay | Admitting: Internal Medicine

## 2012-10-17 ENCOUNTER — Other Ambulatory Visit: Payer: Self-pay | Admitting: Internal Medicine

## 2012-10-26 ENCOUNTER — Other Ambulatory Visit (INDEPENDENT_AMBULATORY_CARE_PROVIDER_SITE_OTHER): Payer: Medicare Other

## 2012-10-26 DIAGNOSIS — E785 Hyperlipidemia, unspecified: Secondary | ICD-10-CM

## 2012-10-26 DIAGNOSIS — I10 Essential (primary) hypertension: Secondary | ICD-10-CM

## 2012-10-26 DIAGNOSIS — J45909 Unspecified asthma, uncomplicated: Secondary | ICD-10-CM

## 2012-10-26 DIAGNOSIS — IMO0001 Reserved for inherently not codable concepts without codable children: Secondary | ICD-10-CM

## 2012-10-26 DIAGNOSIS — R55 Syncope and collapse: Secondary | ICD-10-CM

## 2012-10-26 LAB — BASIC METABOLIC PANEL
BUN: 14 mg/dL (ref 6–23)
Creatinine, Ser: 0.9 mg/dL (ref 0.4–1.5)
GFR: 95.14 mL/min (ref >60.00)
Potassium: 4.3 mEq/L (ref 3.5–5.1)

## 2012-10-26 LAB — HEMOGLOBIN A1C: Hgb A1c MFr Bld: 6.4 % (ref 4.6–6.5)

## 2012-10-26 LAB — LIPID PANEL
Cholesterol: 146 mg/dL (ref 0–200)
LDL Cholesterol: 54 mg/dL (ref 0–99)
Triglycerides: 84 mg/dL (ref 0.0–149.0)
VLDL: 16.8 mg/dL (ref 0.0–40.0)

## 2012-10-26 LAB — HEPATIC FUNCTION PANEL: Total Bilirubin: 0.7 mg/dL (ref 0.3–1.2)

## 2012-11-04 ENCOUNTER — Encounter: Payer: Self-pay | Admitting: Internal Medicine

## 2012-11-04 ENCOUNTER — Ambulatory Visit: Payer: Self-pay | Admitting: Internal Medicine

## 2012-11-04 ENCOUNTER — Ambulatory Visit (INDEPENDENT_AMBULATORY_CARE_PROVIDER_SITE_OTHER): Payer: Medicare Other | Admitting: Internal Medicine

## 2012-11-04 VITALS — BP 150/86 | HR 55 | Temp 97.7°F | Ht 63.0 in | Wt 182.0 lb

## 2012-11-04 DIAGNOSIS — IMO0001 Reserved for inherently not codable concepts without codable children: Secondary | ICD-10-CM

## 2012-11-04 DIAGNOSIS — N320 Bladder-neck obstruction: Secondary | ICD-10-CM

## 2012-11-04 MED ORDER — CLONAZEPAM 0.25 MG PO TBDP: 0.2500 mg | ORAL_TABLET | Freq: Every evening | ORAL | Status: DC | PRN

## 2012-11-04 NOTE — Progress Notes (Signed)
   Subjective:     HPI  The patient is here for a wellness exam. The patient has been doing well overall without major physical or psychological issues going on lately, except for DM.  F/u a new onset of DM2 - decompensated; better now with Rx prescribed   CBGs 75-115 lately; urinating less  The patient presents for a follow-up of  chronic hypertension, chronic dyslipidemia, hydronephrosis controlled with medicines   Wt Readings from Last 3 Encounters:  11/04/12 182 lb (82.555 kg)  07/13/12 182 lb (82.555 kg)  02/21/12 173 lb 8 oz (78.699 kg)   BP Readings from Last 3 Encounters:  11/04/12 150/86  07/13/12 138/88  02/21/12 118/72        Review of Systems  Constitutional: Negative for appetite change, fatigue and unexpected weight change.  HENT: Negative for nosebleeds, congestion, sore throat, sneezing, trouble swallowing and neck pain.   Eyes: Negative for itching and visual disturbance.  Respiratory: Negative for cough.   Cardiovascular: Negative for chest pain, palpitations and leg swelling.  Gastrointestinal: Negative for diarrhea, blood in stool and abdominal distention.  Musculoskeletal: Negative for back pain, joint swelling and gait problem.  Skin: Negative for rash.  Neurological: Negative for dizziness, tremors, speech difficulty and weakness.  Psychiatric/Behavioral: Negative for sleep disturbance, dysphoric mood and agitation. The patient is not nervous/anxious.        Objective:   Physical Exam  Constitutional: He is oriented to person, place, and time. He appears well-developed.  HENT:  Mouth/Throat: Oropharynx is clear and moist.  Hearing aids in-ear  Eyes: Conjunctivae are normal. Pupils are equal, round, and reactive to light.  Neck: Normal range of motion. No JVD present. No thyromegaly present.  Cardiovascular: Normal rate, regular rhythm, normal heart sounds and intact distal pulses.  Exam reveals no gallop and no friction rub.   No murmur  heard. Pulmonary/Chest: Effort normal and breath sounds normal. No respiratory distress. He has no wheezes. He has no rales. He exhibits no tenderness.  Abdominal: Soft. Bowel sounds are normal. He exhibits no distension and no mass. There is no tenderness. There is no rebound and no guarding.  Genitourinary: Rectum normal, prostate normal and penis normal. Guaiac negative stool.  Musculoskeletal: Normal range of motion. He exhibits no edema and no tenderness.  Lymphadenopathy:    He has no cervical adenopathy.  Neurological: He is alert and oriented to person, place, and time. He has normal reflexes. No cranial nerve deficit. He exhibits normal muscle tone. Coordination normal.  Skin: Skin is warm and dry. No rash noted.  Psychiatric: He has a normal mood and affect. His behavior is normal. Judgment and thought content normal.   Lab Results  Component Value Date   WBC 6.8 02/13/2012   HGB 15.4 02/13/2012   HCT 43.1 02/13/2012   PLT 203.0 02/13/2012   GLUCOSE 103* 10/26/2012   CHOL 146 10/26/2012   TRIG 84.0 10/26/2012   HDL 75.20 10/26/2012   LDLCALC 54 10/26/2012   ALT 24 10/26/2012   AST 21 10/26/2012   NA 142 10/26/2012   K 4.3 10/26/2012   CL 107 10/26/2012   CREATININE 0.9 10/26/2012   BUN 14 10/26/2012   CO2 25 10/26/2012   TSH 1.41 02/13/2012   PSA 0.78 02/13/2012   HGBA1C 6.4 10/26/2012   MICROALBUR 1.0 02/22/2011          Assessment & Plan:

## 2012-11-04 NOTE — Assessment & Plan Note (Signed)
Continue with current prescription therapy as reflected on the Med list.  

## 2012-11-05 ENCOUNTER — Encounter: Payer: Self-pay | Admitting: Internal Medicine

## 2012-11-06 ENCOUNTER — Other Ambulatory Visit: Payer: Self-pay | Admitting: Internal Medicine

## 2012-11-06 MED ORDER — SAXAGLIPTIN-METFORMIN ER 2.5-1000 MG PO TB24: 1.0000 | ORAL_TABLET | Freq: Every day | ORAL | Status: DC

## 2012-11-06 MED ORDER — SAXAGLIPTIN-METFORMIN ER 5-1000 MG PO TB24: ORAL_TABLET | ORAL | Status: DC

## 2012-11-11 ENCOUNTER — Telehealth: Payer: Self-pay | Admitting: *Deleted

## 2012-11-11 ENCOUNTER — Ambulatory Visit: Payer: Medicare Other | Admitting: Internal Medicine

## 2012-11-11 NOTE — Telephone Encounter (Signed)
As per our conversation yesterday, the Kombiglyze 09-998 is just as expensive as the 2.5 twice a day. Same with the Januvia as it is a name brand and no generic available. The only savings for me would be to take the 2.5 once a day. Can I try this? ThxMichele Mcalpine

## 2012-11-12 NOTE — Telephone Encounter (Signed)
Yes. Check CBG 2-3 times per week to make sure it is ok Thx

## 2012-11-12 NOTE — Telephone Encounter (Signed)
Left detailed mess informing pt of below.  

## 2012-11-24 ENCOUNTER — Other Ambulatory Visit: Payer: Self-pay | Admitting: *Deleted

## 2012-11-24 MED ORDER — PIOGLITAZONE HCL 45 MG PO TABS: 45.0000 mg | ORAL_TABLET | ORAL | Status: DC

## 2013-01-22 ENCOUNTER — Other Ambulatory Visit: Payer: Self-pay | Admitting: Internal Medicine

## 2013-01-25 ENCOUNTER — Other Ambulatory Visit: Payer: Self-pay | Admitting: Internal Medicine

## 2013-01-29 NOTE — Telephone Encounter (Signed)
Ok to refill? Last OV 6.11.14

## 2013-02-01 ENCOUNTER — Telehealth: Payer: Self-pay | Admitting: *Deleted

## 2013-02-01 MED ORDER — TRAMADOL HCL 50 MG PO TABS: 50.0000 mg | ORAL_TABLET | Freq: Four times a day (QID) | ORAL | Status: DC | PRN

## 2013-02-01 NOTE — Telephone Encounter (Signed)
Called again, requesting refill on Tramadol. Last OV 6.11.14

## 2013-02-01 NOTE — Telephone Encounter (Signed)
OK to fill this prescription with additional refills x3 Thank you!  

## 2013-02-01 NOTE — Telephone Encounter (Signed)
rx called in

## 2013-03-02 ENCOUNTER — Other Ambulatory Visit (INDEPENDENT_AMBULATORY_CARE_PROVIDER_SITE_OTHER): Payer: Medicare Other

## 2013-03-02 DIAGNOSIS — IMO0001 Reserved for inherently not codable concepts without codable children: Secondary | ICD-10-CM

## 2013-03-02 DIAGNOSIS — N320 Bladder-neck obstruction: Secondary | ICD-10-CM

## 2013-03-02 LAB — PSA: PSA: 1.18 ng/mL (ref 0.10–4.00)

## 2013-03-02 LAB — BASIC METABOLIC PANEL
CO2: 29 mEq/L (ref 19–32)
Chloride: 103 mEq/L (ref 96–112)
Creatinine, Ser: 0.9 mg/dL (ref 0.4–1.5)
Potassium: 4.8 mEq/L (ref 3.5–5.1)
Sodium: 141 mEq/L (ref 135–145)

## 2013-03-02 LAB — HEMOGLOBIN A1C: Hgb A1c MFr Bld: 6.9 % — ABNORMAL HIGH (ref 4.6–6.5)

## 2013-03-10 ENCOUNTER — Encounter: Payer: Self-pay | Admitting: Internal Medicine

## 2013-03-10 ENCOUNTER — Ambulatory Visit (INDEPENDENT_AMBULATORY_CARE_PROVIDER_SITE_OTHER): Payer: Medicare Other | Admitting: Internal Medicine

## 2013-03-10 VITALS — BP 136/88 | HR 80 | Temp 98.3°F | Resp 16 | Wt 186.0 lb

## 2013-03-10 DIAGNOSIS — E119 Type 2 diabetes mellitus without complications: Secondary | ICD-10-CM | POA: Insufficient documentation

## 2013-03-10 DIAGNOSIS — Z23 Encounter for immunization: Secondary | ICD-10-CM

## 2013-03-10 DIAGNOSIS — E1065 Type 1 diabetes mellitus with hyperglycemia: Secondary | ICD-10-CM

## 2013-03-10 DIAGNOSIS — IMO0002 Reserved for concepts with insufficient information to code with codable children: Secondary | ICD-10-CM

## 2013-03-10 MED ORDER — PIOGLITAZONE HCL 45 MG PO TABS: 22.5000 mg | ORAL_TABLET | Freq: Every day | ORAL | Status: DC

## 2013-03-10 NOTE — Assessment & Plan Note (Signed)
Continue with current prescription therapy as reflected on the Med list, except for increasing Actos Labs

## 2013-03-10 NOTE — Progress Notes (Signed)
   Subjective:     HPI    F/u a new onset of DM2 - compensated; better now with Rx prescribed   CBGs 75-115 lately; urinating nl  The patient presents for a follow-up of  chronic hypertension, chronic dyslipidemia, hydronephrosis controlled with medicines   Wt Readings from Last 3 Encounters:  03/10/13 186 lb (84.369 kg)  11/04/12 182 lb (82.555 kg)  07/13/12 182 lb (82.555 kg)   BP Readings from Last 3 Encounters:  03/10/13 136/88  11/04/12 150/86  07/13/12 138/88        Review of Systems  Constitutional: Negative for appetite change, fatigue and unexpected weight change.  HENT: Negative for congestion, nosebleeds, sneezing, sore throat and trouble swallowing.   Eyes: Negative for itching and visual disturbance.  Respiratory: Negative for cough.   Cardiovascular: Negative for chest pain, palpitations and leg swelling.  Gastrointestinal: Negative for diarrhea, blood in stool and abdominal distention.  Musculoskeletal: Negative for back pain, gait problem, joint swelling and neck pain.  Skin: Negative for rash.  Neurological: Negative for dizziness, tremors, speech difficulty and weakness.  Psychiatric/Behavioral: Negative for sleep disturbance, dysphoric mood and agitation. The patient is not nervous/anxious.        Objective:   Physical Exam  Constitutional: He is oriented to person, place, and time. He appears well-developed.  HENT:  Mouth/Throat: Oropharynx is clear and moist.  Hearing aids in-ear  Eyes: Conjunctivae are normal. Pupils are equal, round, and reactive to light.  Neck: Normal range of motion. No JVD present. No thyromegaly present.  Cardiovascular: Normal rate, regular rhythm, normal heart sounds and intact distal pulses.  Exam reveals no gallop and no friction rub.   No murmur heard. Pulmonary/Chest: Effort normal and breath sounds normal. No respiratory distress. He has no wheezes. He has no rales. He exhibits no tenderness.  Abdominal: Soft.  Bowel sounds are normal. He exhibits no distension and no mass. There is no tenderness. There is no rebound and no guarding.  Genitourinary: Rectum normal, prostate normal and penis normal. Guaiac negative stool.  Musculoskeletal: Normal range of motion. He exhibits no edema and no tenderness.  Lymphadenopathy:    He has no cervical adenopathy.  Neurological: He is alert and oriented to person, place, and time. He has normal reflexes. No cranial nerve deficit. He exhibits normal muscle tone. Coordination normal.  Skin: Skin is warm and dry. No rash noted.  Psychiatric: He has a normal mood and affect. His behavior is normal. Judgment and thought content normal.   Lab Results  Component Value Date   WBC 6.8 02/13/2012   HGB 15.4 02/13/2012   HCT 43.1 02/13/2012   PLT 203.0 02/13/2012   GLUCOSE 123* 03/02/2013   CHOL 146 10/26/2012   TRIG 84.0 10/26/2012   HDL 75.20 10/26/2012   LDLCALC 54 10/26/2012   ALT 24 10/26/2012   AST 21 10/26/2012   NA 141 03/02/2013   K 4.8 03/02/2013   CL 103 03/02/2013   CREATININE 0.9 03/02/2013   BUN 11 03/02/2013   CO2 29 03/02/2013   TSH 1.41 02/13/2012   PSA 1.18 03/02/2013   HGBA1C 6.9* 03/02/2013   MICROALBUR 1.0 02/22/2011          Assessment & Plan:

## 2013-03-11 ENCOUNTER — Telehealth: Payer: Self-pay | Admitting: *Deleted

## 2013-03-11 NOTE — Telephone Encounter (Signed)
Pt's wife called stating he patient's AVS has Type I (juvenile type) diabetes mellitus with unspecified complication, uncontrolled 250.93. They want to make sure this diagnosis is correct and if not they would like his OV note updated. Please advise. They are aware that PCP is out of office until 03/15/13.

## 2013-03-12 NOTE — Telephone Encounter (Signed)
It was a Nurse, adult

## 2013-03-15 NOTE — Telephone Encounter (Signed)
Left detailed mess informing pt's wife of below.  

## 2013-04-06 ENCOUNTER — Encounter: Payer: Self-pay | Admitting: Internal Medicine

## 2013-06-14 ENCOUNTER — Encounter: Payer: Self-pay | Admitting: Internal Medicine

## 2013-06-14 DIAGNOSIS — H9319 Tinnitus, unspecified ear: Secondary | ICD-10-CM

## 2013-06-20 ENCOUNTER — Encounter: Payer: Self-pay | Admitting: Internal Medicine

## 2013-06-23 ENCOUNTER — Other Ambulatory Visit: Payer: Self-pay | Admitting: Internal Medicine

## 2013-07-07 ENCOUNTER — Other Ambulatory Visit (INDEPENDENT_AMBULATORY_CARE_PROVIDER_SITE_OTHER): Payer: Medicare Other

## 2013-07-07 DIAGNOSIS — E119 Type 2 diabetes mellitus without complications: Secondary | ICD-10-CM

## 2013-07-07 LAB — HEMOGLOBIN A1C: Hgb A1c MFr Bld: 6.8 % — ABNORMAL HIGH (ref 4.6–6.5)

## 2013-07-07 LAB — BASIC METABOLIC PANEL
BUN: 12 mg/dL (ref 6–23)
CHLORIDE: 104 meq/L (ref 96–112)
CO2: 28 mEq/L (ref 19–32)
Calcium: 9.1 mg/dL (ref 8.4–10.5)
Creatinine, Ser: 0.8 mg/dL (ref 0.4–1.5)
GFR: 106.42 mL/min (ref >60.00)
GLUCOSE: 117 mg/dL — AB (ref 70–99)
Potassium: 4.4 mEq/L (ref 3.5–5.1)
Sodium: 140 mEq/L (ref 135–145)

## 2013-07-13 ENCOUNTER — Ambulatory Visit: Payer: Medicare Other | Admitting: Internal Medicine

## 2013-07-22 ENCOUNTER — Ambulatory Visit: Payer: Self-pay | Admitting: Internal Medicine

## 2013-07-23 ENCOUNTER — Encounter: Payer: Self-pay | Admitting: Internal Medicine

## 2013-07-23 ENCOUNTER — Ambulatory Visit (INDEPENDENT_AMBULATORY_CARE_PROVIDER_SITE_OTHER): Payer: Medicare Other | Admitting: Internal Medicine

## 2013-07-23 VITALS — BP 136/84 | HR 65 | Temp 97.5°F | Resp 16 | Ht 64.0 in | Wt 189.0 lb

## 2013-07-23 DIAGNOSIS — L570 Actinic keratosis: Secondary | ICD-10-CM | POA: Insufficient documentation

## 2013-07-23 DIAGNOSIS — E119 Type 2 diabetes mellitus without complications: Secondary | ICD-10-CM

## 2013-07-23 DIAGNOSIS — Z23 Encounter for immunization: Secondary | ICD-10-CM

## 2013-07-23 NOTE — Assessment & Plan Note (Signed)
Continue with current prescription therapy as reflected on the Med list.  

## 2013-07-23 NOTE — Progress Notes (Signed)
Pre visit review using our clinic review tool, if applicable. No additional management support is needed unless otherwise documented below in the visit note. 

## 2013-07-23 NOTE — Assessment & Plan Note (Signed)
See Cryo 

## 2013-07-23 NOTE — Progress Notes (Signed)
Subjective:     HPI    F/u a new onset of DM2 - compensated; better now with Rx prescribed   CBGs 75-115 lately; urinating nl  The patient presents for a follow-up of  chronic hypertension, chronic dyslipidemia, hydronephrosis controlled with medicines   Wt Readings from Last 3 Encounters:  07/23/13 189 lb (85.73 kg)  03/10/13 186 lb (84.369 kg)  11/04/12 182 lb (82.555 kg)   BP Readings from Last 3 Encounters:  07/23/13 136/84  03/10/13 136/88  11/04/12 150/86        Review of Systems  Constitutional: Negative for appetite change, fatigue and unexpected weight change.  HENT: Negative for congestion, nosebleeds, sneezing, sore throat and trouble swallowing.   Eyes: Negative for itching and visual disturbance.  Respiratory: Negative for cough.   Cardiovascular: Negative for chest pain, palpitations and leg swelling.  Gastrointestinal: Negative for diarrhea, blood in stool and abdominal distention.  Musculoskeletal: Negative for back pain, gait problem, joint swelling and neck pain.  Skin: Negative for rash.  Neurological: Negative for dizziness, tremors, speech difficulty and weakness.  Psychiatric/Behavioral: Negative for sleep disturbance, dysphoric mood and agitation. The patient is not nervous/anxious.        Objective:   Physical Exam  Constitutional: He is oriented to person, place, and time. He appears well-developed.  HENT:  Mouth/Throat: Oropharynx is clear and moist.  Hearing aids in-ear  Eyes: Conjunctivae are normal. Pupils are equal, round, and reactive to light.  Neck: Normal range of motion. No JVD present. No thyromegaly present.  Cardiovascular: Normal rate, regular rhythm, normal heart sounds and intact distal pulses.  Exam reveals no gallop and no friction rub.   No murmur heard. Pulmonary/Chest: Effort normal and breath sounds normal. No respiratory distress. He has no wheezes. He has no rales. He exhibits no tenderness.  Abdominal: Soft.  Bowel sounds are normal. He exhibits no distension and no mass. There is no tenderness. There is no rebound and no guarding.  Genitourinary: Rectum normal, prostate normal and penis normal. Guaiac negative stool.  Musculoskeletal: Normal range of motion. He exhibits no edema and no tenderness.  Lymphadenopathy:    He has no cervical adenopathy.  Neurological: He is alert and oriented to person, place, and time. He has normal reflexes. No cranial nerve deficit. He exhibits normal muscle tone. Coordination normal.  Skin: Skin is warm and dry. No rash noted.  Psychiatric: He has a normal mood and affect. His behavior is normal. Judgment and thought content normal.  AK L ear Lab Results  Component Value Date   WBC 6.8 02/13/2012   HGB 15.4 02/13/2012   HCT 43.1 02/13/2012   PLT 203.0 02/13/2012   GLUCOSE 117* 07/07/2013   CHOL 146 10/26/2012   TRIG 84.0 10/26/2012   HDL 75.20 10/26/2012   LDLCALC 54 10/26/2012   ALT 24 10/26/2012   AST 21 10/26/2012   NA 140 07/07/2013   K 4.4 07/07/2013   CL 104 07/07/2013   CREATININE 0.8 07/07/2013   BUN 12 07/07/2013   CO2 28 07/07/2013   TSH 1.41 02/13/2012   PSA 1.18 03/02/2013   HGBA1C 6.8* 07/07/2013   MICROALBUR 1.0 02/22/2011     Procedure Note :     Procedure : Cryosurgery   Indication:    Actinic keratosis(es)   Risks including unsuccessful procedure , bleeding, infection, bruising, scar, a need for a repeat  procedure and others were explained to the patient in detail as well as the benefits. Informed  consent was obtained verbally.    1 lesion(s)  on L ear   was/were treated with liquid nitrogen on a Q-tip in a usual fasion . Band-Aid was applied and antibiotic ointment was given for a later use.   Tolerated well. Complications none.   Postprocedure instructions :     Keep the wounds clean. You can wash them with liquid soap and water. Pat dry with gauze or a Kleenex tissue  Before applying antibiotic ointment and a Band-Aid.   You need to report  immediately  if  any signs of infection develop.         Assessment & Plan:

## 2013-07-27 ENCOUNTER — Telehealth: Payer: Self-pay

## 2013-07-27 NOTE — Telephone Encounter (Signed)
Relevant patient education assigned to patient using Emmi. ° °

## 2013-09-02 ENCOUNTER — Other Ambulatory Visit: Payer: Self-pay

## 2013-09-02 ENCOUNTER — Other Ambulatory Visit: Payer: Self-pay | Admitting: Internal Medicine

## 2013-09-02 NOTE — Telephone Encounter (Signed)
Refill done.  

## 2013-10-04 ENCOUNTER — Other Ambulatory Visit: Payer: Self-pay | Admitting: Internal Medicine

## 2013-11-09 ENCOUNTER — Other Ambulatory Visit (INDEPENDENT_AMBULATORY_CARE_PROVIDER_SITE_OTHER): Payer: Medicare Other

## 2013-11-09 DIAGNOSIS — E119 Type 2 diabetes mellitus without complications: Secondary | ICD-10-CM

## 2013-11-09 LAB — BASIC METABOLIC PANEL
BUN: 12 mg/dL (ref 6–23)
CHLORIDE: 102 meq/L (ref 96–112)
CO2: 31 mEq/L (ref 19–32)
Calcium: 9.3 mg/dL (ref 8.4–10.5)
Creatinine, Ser: 0.9 mg/dL (ref 0.4–1.5)
GFR: 85.5 mL/min (ref >60.00)
Glucose, Bld: 138 mg/dL — ABNORMAL HIGH (ref 70–99)
POTASSIUM: 4 meq/L (ref 3.5–5.1)
Sodium: 136 mEq/L (ref 135–145)

## 2013-11-09 LAB — HEMOGLOBIN A1C: HEMOGLOBIN A1C: 6.8 % — AB (ref 4.6–6.5)

## 2013-11-17 ENCOUNTER — Ambulatory Visit: Payer: Medicare Other | Admitting: Internal Medicine

## 2013-11-22 ENCOUNTER — Ambulatory Visit: Payer: Medicare Other | Admitting: Internal Medicine

## 2013-11-23 ENCOUNTER — Telehealth: Payer: Self-pay | Admitting: *Deleted

## 2013-11-23 DIAGNOSIS — E119 Type 2 diabetes mellitus without complications: Secondary | ICD-10-CM

## 2013-11-23 NOTE — Telephone Encounter (Signed)
Patient will come in fasting for his appointment tomorrow for a lipid panel.  Lipid panel ordered.

## 2013-11-23 NOTE — Telephone Encounter (Signed)
Pt stated he received call from nurse about needing his cholesterol check. Wanting to know when he can do labs. Inform pt order is already in the computer he can come in the am just make sure he is fasting 6-8 hours...[name]

## 2013-11-24 ENCOUNTER — Encounter: Payer: Self-pay | Admitting: Internal Medicine

## 2013-11-24 ENCOUNTER — Telehealth: Payer: Self-pay | Admitting: Internal Medicine

## 2013-11-24 ENCOUNTER — Other Ambulatory Visit (INDEPENDENT_AMBULATORY_CARE_PROVIDER_SITE_OTHER): Payer: Medicare Other

## 2013-11-24 ENCOUNTER — Ambulatory Visit (INDEPENDENT_AMBULATORY_CARE_PROVIDER_SITE_OTHER): Payer: Medicare Other | Admitting: Internal Medicine

## 2013-11-24 VITALS — BP 140/80 | HR 72 | Temp 98.3°F | Resp 16 | Wt 185.0 lb

## 2013-11-24 DIAGNOSIS — E119 Type 2 diabetes mellitus without complications: Secondary | ICD-10-CM

## 2013-11-24 DIAGNOSIS — J45909 Unspecified asthma, uncomplicated: Secondary | ICD-10-CM

## 2013-11-24 DIAGNOSIS — N320 Bladder-neck obstruction: Secondary | ICD-10-CM

## 2013-11-24 DIAGNOSIS — Z0000 Encounter for general adult medical examination without abnormal findings: Secondary | ICD-10-CM

## 2013-11-24 DIAGNOSIS — E1169 Type 2 diabetes mellitus with other specified complication: Secondary | ICD-10-CM

## 2013-11-24 DIAGNOSIS — I10 Essential (primary) hypertension: Secondary | ICD-10-CM

## 2013-11-24 DIAGNOSIS — E669 Obesity, unspecified: Secondary | ICD-10-CM

## 2013-11-24 DIAGNOSIS — E785 Hyperlipidemia, unspecified: Secondary | ICD-10-CM

## 2013-11-24 LAB — LIPID PANEL
CHOLESTEROL: 168 mg/dL (ref 0–200)
HDL: 79.7 mg/dL (ref >39.00)
LDL Cholesterol: 41 mg/dL (ref 0–99)
NonHDL: 88.3
Total CHOL/HDL Ratio: 2
Triglycerides: 236 mg/dL — ABNORMAL HIGH (ref 0.0–149.0)
VLDL: 47.2 mg/dL — AB (ref 0.0–40.0)

## 2013-11-24 MED ORDER — GLUCOSE BLOOD VI STRP: 1.0000 | ORAL_STRIP | Freq: Two times a day (BID) | Status: DC | PRN

## 2013-11-24 MED ORDER — AMLODIPINE BESYLATE 10 MG PO TABS: 10.0000 mg | ORAL_TABLET | Freq: Every day | ORAL | Status: DC

## 2013-11-24 MED ORDER — TRAMADOL HCL 50 MG PO TABS: 50.0000 mg | ORAL_TABLET | Freq: Two times a day (BID) | ORAL | Status: DC | PRN

## 2013-11-24 MED ORDER — ONETOUCH ULTRASOFT LANCETS MISC: Status: DC

## 2013-11-24 NOTE — Assessment & Plan Note (Signed)
Doing well 

## 2013-11-24 NOTE — Assessment & Plan Note (Signed)
Continue with current prescription therapy as reflected on the Med list. Labs  

## 2013-11-24 NOTE — Assessment & Plan Note (Signed)
Continue with current prescription therapy as reflected on the Med list.  

## 2013-11-24 NOTE — Assessment & Plan Note (Signed)
Doing well Continue with current prescription therapy as reflected on the Med list.  

## 2013-11-24 NOTE — Progress Notes (Signed)
   Subjective:     HPI    F/u a new onset of DM2 - compensated; better now with Rx prescribed   CBGs 75-115 lately; urinating nl  The patient presents for a follow-up of  chronic hypertension, chronic dyslipidemia, hydronephrosis controlled with medicines   Wt Readings from Last 3 Encounters:  11/24/13 185 lb (83.915 kg)  07/23/13 189 lb (85.73 kg)  03/10/13 186 lb (84.369 kg)   BP Readings from Last 3 Encounters:  11/24/13 140/80  07/23/13 136/84  03/10/13 136/88        Review of Systems  Constitutional: Negative for appetite change, fatigue and unexpected weight change.  HENT: Negative for congestion, nosebleeds, sneezing, sore throat and trouble swallowing.   Eyes: Negative for itching and visual disturbance.  Respiratory: Negative for cough.   Cardiovascular: Negative for chest pain, palpitations and leg swelling.  Gastrointestinal: Negative for diarrhea, blood in stool and abdominal distention.  Musculoskeletal: Negative for back pain, gait problem, joint swelling and neck pain.  Skin: Negative for rash.  Neurological: Negative for dizziness, tremors, speech difficulty and weakness.  Psychiatric/Behavioral: Negative for sleep disturbance, dysphoric mood and agitation. The patient is not nervous/anxious.        Objective:   Physical Exam  Constitutional: He is oriented to person, place, and time. He appears well-developed.  HENT:  Mouth/Throat: Oropharynx is clear and moist.  Hearing aids in-ear  Eyes: Conjunctivae are normal. Pupils are equal, round, and reactive to light.  Neck: Normal range of motion. No JVD present. No thyromegaly present.  Cardiovascular: Normal rate, regular rhythm, normal heart sounds and intact distal pulses.  Exam reveals no gallop and no friction rub.   No murmur heard. Pulmonary/Chest: Effort normal and breath sounds normal. No respiratory distress. He has no wheezes. He has no rales. He exhibits no tenderness.  Abdominal: Soft.  Bowel sounds are normal. He exhibits no distension and no mass. There is no tenderness. There is no rebound and no guarding.  Genitourinary: Rectum normal, prostate normal and penis normal. Guaiac negative stool.  Musculoskeletal: Normal range of motion. He exhibits no edema and no tenderness.  Lymphadenopathy:    He has no cervical adenopathy.  Neurological: He is alert and oriented to person, place, and time. He has normal reflexes. No cranial nerve deficit. He exhibits normal muscle tone. Coordination normal.  Skin: Skin is warm and dry. No rash noted.  Psychiatric: He has a normal mood and affect. His behavior is normal. Judgment and thought content normal.   Lab Results  Component Value Date   WBC 6.8 02/13/2012   HGB 15.4 02/13/2012   HCT 43.1 02/13/2012   PLT 203.0 02/13/2012   GLUCOSE 138* 11/09/2013   CHOL 168 11/24/2013   TRIG 236.0* 11/24/2013   HDL 79.70 11/24/2013   LDLCALC 41 11/24/2013   ALT 24 10/26/2012   AST 21 10/26/2012   NA 136 11/09/2013   K 4.0 11/09/2013   CL 102 11/09/2013   CREATININE 0.9 11/09/2013   BUN 12 11/09/2013   CO2 31 11/09/2013   TSH 1.41 02/13/2012   PSA 1.18 03/02/2013   HGBA1C 6.8* 11/09/2013   MICROALBUR 1.0 02/22/2011       Assessment & Plan:

## 2013-11-24 NOTE — Telephone Encounter (Signed)
Pt was wondering if he need to do lab work prior to his appt in 03/28/14. Please advise.

## 2013-11-24 NOTE — Progress Notes (Signed)
Pre visit review using our clinic review tool, if applicable. No additional management support is needed unless otherwise documented below in the visit note. 

## 2013-11-24 NOTE — Telephone Encounter (Signed)
Left detail massage for pt to do blood work a week prior.

## 2013-11-24 NOTE — Telephone Encounter (Signed)
Yes. Thx.

## 2013-12-01 ENCOUNTER — Other Ambulatory Visit: Payer: Self-pay | Admitting: Internal Medicine

## 2013-12-01 NOTE — Telephone Encounter (Signed)
Refills for one touch ultra test strips qty 100 4 rfs and one touch delica lancets sent into pharm

## 2013-12-06 ENCOUNTER — Other Ambulatory Visit: Payer: Self-pay | Admitting: Internal Medicine

## 2014-01-13 ENCOUNTER — Other Ambulatory Visit: Payer: Self-pay | Admitting: Internal Medicine

## 2014-01-29 ENCOUNTER — Other Ambulatory Visit: Payer: Self-pay | Admitting: Internal Medicine

## 2014-02-01 ENCOUNTER — Other Ambulatory Visit: Payer: Self-pay | Admitting: Internal Medicine

## 2014-02-01 ENCOUNTER — Encounter: Payer: Self-pay | Admitting: Internal Medicine

## 2014-02-01 MED ORDER — FLUTICASONE PROPIONATE 50 MCG/ACT NA SUSP
2.0000 | Freq: Every day | NASAL | Status: DC
Start: 2014-02-01 — End: 2014-11-17

## 2014-02-04 ENCOUNTER — Telehealth: Payer: Self-pay

## 2014-02-04 NOTE — Telephone Encounter (Signed)
LVM for pt to call back.   RE: Scheduling nurse visit for bp recheck.

## 2014-02-15 ENCOUNTER — Ambulatory Visit: Payer: Medicare Other

## 2014-02-15 VITALS — BP 128/88

## 2014-02-15 DIAGNOSIS — Z0130 Encounter for examination of blood pressure without abnormal findings: Secondary | ICD-10-CM

## 2014-03-10 ENCOUNTER — Other Ambulatory Visit: Payer: Self-pay | Admitting: Internal Medicine

## 2014-03-11 ENCOUNTER — Other Ambulatory Visit: Payer: Self-pay

## 2014-03-16 ENCOUNTER — Other Ambulatory Visit: Payer: Self-pay

## 2014-03-16 MED ORDER — ATORVASTATIN CALCIUM 20 MG PO TABS: ORAL_TABLET | ORAL | Status: DC

## 2014-03-16 MED ORDER — PANTOPRAZOLE SODIUM 40 MG PO TBEC: DELAYED_RELEASE_TABLET | ORAL | Status: DC

## 2014-03-17 ENCOUNTER — Other Ambulatory Visit: Payer: Self-pay | Admitting: *Deleted

## 2014-03-17 MED ORDER — PANTOPRAZOLE SODIUM 40 MG PO TBEC: DELAYED_RELEASE_TABLET | ORAL | Status: DC

## 2014-03-22 ENCOUNTER — Other Ambulatory Visit (INDEPENDENT_AMBULATORY_CARE_PROVIDER_SITE_OTHER): Payer: Medicare Other

## 2014-03-22 DIAGNOSIS — E1169 Type 2 diabetes mellitus with other specified complication: Secondary | ICD-10-CM

## 2014-03-22 DIAGNOSIS — N320 Bladder-neck obstruction: Secondary | ICD-10-CM

## 2014-03-22 DIAGNOSIS — E669 Obesity, unspecified: Secondary | ICD-10-CM

## 2014-03-22 DIAGNOSIS — Z0000 Encounter for general adult medical examination without abnormal findings: Secondary | ICD-10-CM

## 2014-03-22 DIAGNOSIS — I10 Essential (primary) hypertension: Secondary | ICD-10-CM

## 2014-03-22 DIAGNOSIS — E119 Type 2 diabetes mellitus without complications: Secondary | ICD-10-CM

## 2014-03-22 LAB — HEPATIC FUNCTION PANEL
ALBUMIN: 3.6 g/dL (ref 3.5–5.2)
ALT: 26 U/L (ref 0–53)
AST: 27 U/L (ref 0–37)
Alkaline Phosphatase: 88 U/L (ref 39–117)
Bilirubin, Direct: 0.1 mg/dL (ref 0.0–0.3)
Total Bilirubin: 0.9 mg/dL (ref 0.2–1.2)
Total Protein: 7 g/dL (ref 6.0–8.3)

## 2014-03-22 LAB — BASIC METABOLIC PANEL
BUN: 11 mg/dL (ref 6–23)
CALCIUM: 9.2 mg/dL (ref 8.4–10.5)
CO2: 26 meq/L (ref 19–32)
Chloride: 101 mEq/L (ref 96–112)
Creatinine, Ser: 0.8 mg/dL (ref 0.4–1.5)
GFR: 101.62 mL/min (ref >60.00)
GLUCOSE: 119 mg/dL — AB (ref 70–99)
Potassium: 4.1 mEq/L (ref 3.5–5.1)
Sodium: 137 mEq/L (ref 135–145)

## 2014-03-22 LAB — URINALYSIS
Bilirubin Urine: NEGATIVE
Hgb urine dipstick: NEGATIVE
Ketones, ur: NEGATIVE
LEUKOCYTES UA: NEGATIVE
Nitrite: NEGATIVE
PH: 6 (ref 5.0–8.0)
SPECIFIC GRAVITY, URINE: 1.025 (ref 1.000–1.030)
Total Protein, Urine: NEGATIVE
UROBILINOGEN UA: 0.2 (ref 0.0–1.0)
Urine Glucose: NEGATIVE

## 2014-03-22 LAB — CBC WITH DIFFERENTIAL/PLATELET
BASOS ABS: 0.1 10*3/uL (ref 0.0–0.1)
Basophils Relative: 1 % (ref 0.0–3.0)
Eosinophils Absolute: 0.4 10*3/uL (ref 0.0–0.7)
Eosinophils Relative: 6.7 % — ABNORMAL HIGH (ref 0.0–5.0)
HCT: 47.3 % (ref 39.0–52.0)
Hemoglobin: 16.1 g/dL (ref 13.0–17.0)
LYMPHS PCT: 26.8 % (ref 12.0–46.0)
Lymphs Abs: 1.5 10*3/uL (ref 0.7–4.0)
MCHC: 34 g/dL (ref 30.0–36.0)
MCV: 92.8 fl (ref 78.0–100.0)
Monocytes Absolute: 0.7 10*3/uL (ref 0.1–1.0)
Monocytes Relative: 12 % (ref 3.0–12.0)
NEUTROS PCT: 53.5 % (ref 43.0–77.0)
Neutro Abs: 2.9 10*3/uL (ref 1.4–7.7)
PLATELETS: 198 10*3/uL (ref 150.0–400.0)
RBC: 5.1 Mil/uL (ref 4.22–5.81)
RDW: 13.5 % (ref 11.5–15.5)
WBC: 5.5 10*3/uL (ref 4.0–10.5)

## 2014-03-22 LAB — TSH: TSH: 1.38 u[IU]/mL (ref 0.35–4.50)

## 2014-03-22 LAB — LIPID PANEL
Cholesterol: 169 mg/dL (ref 0–200)
HDL: 81 mg/dL (ref >39.00)
LDL Cholesterol: 77 mg/dL (ref 0–99)
NonHDL: 88
TRIGLYCERIDES: 54 mg/dL (ref 0.0–149.0)
Total CHOL/HDL Ratio: 2
VLDL: 10.8 mg/dL (ref 0.0–40.0)

## 2014-03-22 LAB — PSA: PSA: 1.03 ng/mL (ref 0.10–4.00)

## 2014-03-22 LAB — HEMOGLOBIN A1C: Hgb A1c MFr Bld: 6.7 % — ABNORMAL HIGH (ref 4.6–6.5)

## 2014-03-28 ENCOUNTER — Ambulatory Visit (INDEPENDENT_AMBULATORY_CARE_PROVIDER_SITE_OTHER): Payer: Medicare Other | Admitting: Internal Medicine

## 2014-03-28 ENCOUNTER — Encounter: Payer: Self-pay | Admitting: Internal Medicine

## 2014-03-28 VITALS — BP 130/84 | HR 63 | Temp 98.1°F | Wt 187.0 lb

## 2014-03-28 DIAGNOSIS — E119 Type 2 diabetes mellitus without complications: Secondary | ICD-10-CM

## 2014-03-28 DIAGNOSIS — Z23 Encounter for immunization: Secondary | ICD-10-CM

## 2014-03-28 DIAGNOSIS — G4700 Insomnia, unspecified: Secondary | ICD-10-CM

## 2014-03-28 DIAGNOSIS — R55 Syncope and collapse: Secondary | ICD-10-CM

## 2014-03-28 DIAGNOSIS — Z0000 Encounter for general adult medical examination without abnormal findings: Secondary | ICD-10-CM

## 2014-03-28 MED ORDER — CLONAZEPAM 0.25 MG PO TBDP: 0.2500 mg | ORAL_TABLET | Freq: Every evening | ORAL | Status: DC | PRN

## 2014-03-28 NOTE — Assessment & Plan Note (Signed)
No relapse 

## 2014-03-28 NOTE — Assessment & Plan Note (Signed)
Continue with current prescription therapy as reflected on the Med list.  

## 2014-03-28 NOTE — Progress Notes (Signed)
Pre visit review using our clinic review tool, if applicable. No additional management support is needed unless otherwise documented below in the visit note. 

## 2014-03-28 NOTE — Patient Instructions (Signed)

## 2014-03-28 NOTE — Progress Notes (Signed)
   Subjective:     HPI  The patient is here for a wellness exam. The patient has been doing well overall without major physical or psychological issues going on lately.  F/u a new onset of DM2 - compensated; better now with Rx prescribed  C/o insomnia  CBGs 75-115 lately; urinating nl  The patient presents for a follow-up of  chronic hypertension, chronic dyslipidemia, hydronephrosis controlled with medicines   Wt Readings from Last 3 Encounters:  03/28/14 187 lb (84.823 kg)  11/24/13 185 lb (83.915 kg)  07/23/13 189 lb (85.73 kg)   BP Readings from Last 3 Encounters:  03/28/14 130/84  02/15/14 128/88  11/24/13 140/80        Review of Systems  Constitutional: Negative for appetite change, fatigue and unexpected weight change.  HENT: Negative for congestion, nosebleeds, sneezing, sore throat and trouble swallowing.   Eyes: Negative for itching and visual disturbance.  Respiratory: Negative for cough.   Cardiovascular: Negative for chest pain, palpitations and leg swelling.  Gastrointestinal: Negative for diarrhea, blood in stool and abdominal distention.  Musculoskeletal: Negative for back pain, joint swelling, gait problem and neck pain.  Skin: Negative for rash.  Neurological: Negative for dizziness, tremors, speech difficulty and weakness.  Psychiatric/Behavioral: Negative for sleep disturbance, dysphoric mood and agitation. The patient is not nervous/anxious.        Objective:   Physical Exam  Constitutional: He is oriented to person, place, and time. He appears well-developed. No distress.  NAD  HENT:  Mouth/Throat: Oropharynx is clear and moist.  Eyes: Conjunctivae are normal. Pupils are equal, round, and reactive to light.  Neck: Normal range of motion. No JVD present. No thyromegaly present.  Cardiovascular: Normal rate, regular rhythm, normal heart sounds and intact distal pulses.  Exam reveals no gallop and no friction rub.   No murmur  heard. Pulmonary/Chest: Effort normal and breath sounds normal. No respiratory distress. He has no wheezes. He has no rales. He exhibits no tenderness.  Abdominal: Soft. Bowel sounds are normal. He exhibits no distension and no mass. There is no tenderness. There is no rebound and no guarding.  Musculoskeletal: Normal range of motion. He exhibits no edema or tenderness.  Lymphadenopathy:    He has no cervical adenopathy.  Neurological: He is alert and oriented to person, place, and time. He has normal reflexes. No cranial nerve deficit. He exhibits normal muscle tone. He displays a negative Romberg sign. Coordination and gait normal.  Skin: Skin is warm and dry. No rash noted.  Psychiatric: He has a normal mood and affect. His behavior is normal. Judgment and thought content normal.   Lab Results  Component Value Date   WBC 5.5 03/22/2014   HGB 16.1 03/22/2014   HCT 47.3 03/22/2014   PLT 198.0 03/22/2014   GLUCOSE 119* 03/22/2014   CHOL 169 03/22/2014   TRIG 54.0 03/22/2014   HDL 81.00 03/22/2014   LDLCALC 77 03/22/2014   ALT 26 03/22/2014   AST 27 03/22/2014   NA 137 03/22/2014   K 4.1 03/22/2014   CL 101 03/22/2014   CREATININE 0.8 03/22/2014   BUN 11 03/22/2014   CO2 26 03/22/2014   TSH 1.38 03/22/2014   PSA 1.03 03/22/2014   HGBA1C 6.7* 03/22/2014   MICROALBUR 1.0 02/22/2011       Assessment & Plan:

## 2014-03-28 NOTE — Assessment & Plan Note (Signed)
Chronic  Potential benefits of a long term benzodiazepines  use as well as potential risks  and complications were explained to the patient and were aknowledged. Clonazepam at hs 1-2 tabs prn

## 2014-04-12 ENCOUNTER — Encounter: Payer: Self-pay | Admitting: Internal Medicine

## 2014-04-13 ENCOUNTER — Other Ambulatory Visit: Payer: Self-pay

## 2014-04-13 MED ORDER — ATORVASTATIN CALCIUM 20 MG PO TABS: ORAL_TABLET | ORAL | Status: DC

## 2014-04-14 ENCOUNTER — Other Ambulatory Visit: Payer: Self-pay | Admitting: Internal Medicine

## 2014-04-14 ENCOUNTER — Encounter: Payer: Self-pay | Admitting: Internal Medicine

## 2014-04-14 MED ORDER — SUVOREXANT 15 MG PO TABS: 15.0000 mg | ORAL_TABLET | Freq: Every evening | ORAL | Status: DC | PRN

## 2014-04-14 NOTE — Telephone Encounter (Signed)
[facility] Rx and savings card upfront for p/u. Emailed pt informing of this.

## 2014-04-15 ENCOUNTER — Encounter: Payer: Self-pay | Admitting: Internal Medicine

## 2014-04-18 ENCOUNTER — Encounter: Payer: Self-pay | Admitting: Internal Medicine

## 2014-04-18 ENCOUNTER — Other Ambulatory Visit: Payer: Self-pay | Admitting: Internal Medicine

## 2014-04-18 MED ORDER — TRAZODONE HCL 50 MG PO TABS: 25.0000 mg | ORAL_TABLET | Freq: Every evening | ORAL | Status: DC | PRN

## 2014-04-24 ENCOUNTER — Other Ambulatory Visit: Payer: Self-pay | Admitting: Internal Medicine

## 2014-04-29 ENCOUNTER — Telehealth: Payer: Self-pay

## 2014-04-29 NOTE — Telephone Encounter (Signed)
PA for Belsom[facility].   No rx for this. Previous notes indicate that pt is going to try the trazadone for sleep since insurance will cover it and it does not have a high deductible.   Faxed PA back to pharmacy and noted the same for their records.

## 2014-06-09 ENCOUNTER — Encounter: Payer: Self-pay | Admitting: Internal Medicine

## 2014-06-27 ENCOUNTER — Encounter: Payer: Self-pay | Admitting: Internal Medicine

## 2014-06-30 ENCOUNTER — Other Ambulatory Visit (INDEPENDENT_AMBULATORY_CARE_PROVIDER_SITE_OTHER): Payer: Medicare Other

## 2014-06-30 DIAGNOSIS — E119 Type 2 diabetes mellitus without complications: Secondary | ICD-10-CM

## 2014-06-30 LAB — BASIC METABOLIC PANEL
BUN: 18 mg/dL (ref 6–23)
CHLORIDE: 104 meq/L (ref 96–112)
CO2: 26 mEq/L (ref 19–32)
Calcium: 9.2 mg/dL (ref 8.4–10.5)
Creatinine, Ser: 1.08 mg/dL (ref 0.40–1.50)
GFR: 71.82 mL/min (ref >60.00)
Glucose, Bld: 133 mg/dL — ABNORMAL HIGH (ref 70–99)
Potassium: 4.3 mEq/L (ref 3.5–5.1)
Sodium: 139 mEq/L (ref 135–145)

## 2014-06-30 LAB — HEMOGLOBIN A1C: Hgb A1c MFr Bld: 7 % — ABNORMAL HIGH (ref 4.6–6.5)

## 2014-07-11 ENCOUNTER — Other Ambulatory Visit: Payer: Self-pay | Admitting: Internal Medicine

## 2014-07-11 ENCOUNTER — Ambulatory Visit: Payer: Medicare Other | Admitting: Internal Medicine

## 2014-07-12 ENCOUNTER — Ambulatory Visit (INDEPENDENT_AMBULATORY_CARE_PROVIDER_SITE_OTHER): Payer: Medicare Other | Admitting: Internal Medicine

## 2014-07-12 ENCOUNTER — Encounter: Payer: Self-pay | Admitting: Internal Medicine

## 2014-07-12 VITALS — BP 140/86 | HR 72 | Wt 190.0 lb

## 2014-07-12 DIAGNOSIS — N200 Calculus of kidney: Secondary | ICD-10-CM | POA: Insufficient documentation

## 2014-07-12 DIAGNOSIS — I10 Essential (primary) hypertension: Secondary | ICD-10-CM

## 2014-07-12 DIAGNOSIS — E119 Type 2 diabetes mellitus without complications: Secondary | ICD-10-CM

## 2014-07-12 DIAGNOSIS — G4700 Insomnia, unspecified: Secondary | ICD-10-CM

## 2014-07-12 MED ORDER — ALPRAZOLAM 0.5 MG PO TABS: 0.5000 mg | ORAL_TABLET | Freq: Every evening | ORAL | Status: DC | PRN

## 2014-07-12 MED ORDER — TRAZODONE HCL 50 MG PO TABS: 50.0000 mg | ORAL_TABLET | Freq: Every evening | ORAL | Status: DC | PRN

## 2014-07-12 NOTE — Progress Notes (Signed)
   Subjective:     HPI  The patient is here for a wellness exam. The patient has been doing well overall without major physical or psychological issues going on lately.  F/u a new onset of DM2 - compensated; better now with Rx prescribed  C/o insomnia  CBGs 75-115 lately; urinating nl  The patient presents for a follow-up of  chronic hypertension, chronic dyslipidemia, hydronephrosis controlled with medicines   Wt Readings from Last 3 Encounters:  07/12/14 190 lb (86.183 kg)  03/28/14 187 lb (84.823 kg)  11/24/13 185 lb (83.915 kg)   BP Readings from Last 3 Encounters:  07/12/14 140/86  03/28/14 130/84  02/15/14 128/88        Review of Systems  Constitutional: Negative for appetite change, fatigue and unexpected weight change.  HENT: Negative for congestion, nosebleeds, sneezing, sore throat and trouble swallowing.   Eyes: Negative for itching and visual disturbance.  Respiratory: Negative for cough.   Cardiovascular: Negative for chest pain, palpitations and leg swelling.  Gastrointestinal: Negative for diarrhea, blood in stool and abdominal distention.  Musculoskeletal: Negative for back pain, joint swelling, gait problem and neck pain.  Skin: Negative for rash.  Neurological: Negative for dizziness, tremors, speech difficulty and weakness.  Psychiatric/Behavioral: Negative for sleep disturbance, dysphoric mood and agitation. The patient is not nervous/anxious.        Objective:   Physical Exam  Constitutional: He is oriented to person, place, and time. He appears well-developed. No distress.  NAD  HENT:  Mouth/Throat: Oropharynx is clear and moist.  Eyes: Conjunctivae are normal. Pupils are equal, round, and reactive to light.  Neck: Normal range of motion. No JVD present. No thyromegaly present.  Cardiovascular: Normal rate, regular rhythm, normal heart sounds and intact distal pulses.  Exam reveals no gallop and no friction rub.   No murmur  heard. Pulmonary/Chest: Effort normal and breath sounds normal. No respiratory distress. He has no wheezes. He has no rales. He exhibits no tenderness.  Abdominal: Soft. Bowel sounds are normal. He exhibits no distension and no mass. There is no tenderness. There is no rebound and no guarding.  Musculoskeletal: Normal range of motion. He exhibits no edema or tenderness.  Lymphadenopathy:    He has no cervical adenopathy.  Neurological: He is alert and oriented to person, place, and time. He has normal reflexes. No cranial nerve deficit. He exhibits normal muscle tone. He displays a negative Romberg sign. Coordination and gait normal.  Skin: Skin is warm and dry. No rash noted.  Psychiatric: He has a normal mood and affect. His behavior is normal. Judgment and thought content normal.   Lab Results  Component Value Date   WBC 5.5 03/22/2014   HGB 16.1 03/22/2014   HCT 47.3 03/22/2014   PLT 198.0 03/22/2014   GLUCOSE 133* 06/30/2014   CHOL 169 03/22/2014   TRIG 54.0 03/22/2014   HDL 81.00 03/22/2014   LDLCALC 77 03/22/2014   ALT 26 03/22/2014   AST 27 03/22/2014   NA 139 06/30/2014   K 4.3 06/30/2014   CL 104 06/30/2014   CREATININE 1.08 06/30/2014   BUN 18 06/30/2014   CO2 26 06/30/2014   TSH 1.38 03/22/2014   PSA 1.03 03/22/2014   HGBA1C 7.0* 06/30/2014   MICROALBUR 1.0 02/22/2011       Assessment & Plan:

## 2014-07-12 NOTE — Telephone Encounter (Signed)
Faxed script back to [facility].../lmb 

## 2014-07-12 NOTE — Progress Notes (Signed)
Pre visit review using our clinic review tool, if applicable. No additional management support is needed unless otherwise documented below in the visit note. 

## 2014-07-13 ENCOUNTER — Encounter: Payer: Self-pay | Admitting: Internal Medicine

## 2014-07-13 ENCOUNTER — Telehealth: Payer: Self-pay | Admitting: Internal Medicine

## 2014-07-13 NOTE — Telephone Encounter (Signed)
emmi emailed °

## 2014-07-13 NOTE — Assessment & Plan Note (Signed)
Chronic Added Xanax Cont Trazadone  Potential benefits of a long term benzodiazepines  use as well as potential risks  and complications were explained to the patient and were aknowledged.

## 2014-07-13 NOTE — Assessment & Plan Note (Signed)
A little worse Low carb diet Wt loss Continue with current prescription therapy as reflected on the Med list.

## 2014-07-13 NOTE — Assessment & Plan Note (Signed)
BP Readings from Last 3 Encounters:  07/12/14 140/86  03/28/14 130/84  02/15/14 128/88  Continue with current prescription therapy as reflected on the Med list.

## 2014-07-27 ENCOUNTER — Encounter: Payer: Self-pay | Admitting: Internal Medicine

## 2014-07-31 ENCOUNTER — Other Ambulatory Visit: Payer: Self-pay | Admitting: Internal Medicine

## 2014-07-31 DIAGNOSIS — M5440 Lumbago with sciatica, unspecified side: Secondary | ICD-10-CM

## 2014-08-23 ENCOUNTER — Encounter: Payer: Self-pay | Admitting: Family Medicine

## 2014-08-23 ENCOUNTER — Ambulatory Visit (INDEPENDENT_AMBULATORY_CARE_PROVIDER_SITE_OTHER)
Admission: RE | Admit: 2014-08-23 | Discharge: 2014-08-23 | Disposition: A | Discharge status: Home / Self Care | Payer: Medicare Other | Source: Ambulatory Visit | Attending: Family Medicine | Admitting: Family Medicine

## 2014-08-23 ENCOUNTER — Ambulatory Visit (INDEPENDENT_AMBULATORY_CARE_PROVIDER_SITE_OTHER): Payer: Medicare Other | Admitting: Family Medicine

## 2014-08-23 VITALS — BP 112/72 | HR 66 | Ht 66.0 in | Wt 186.0 lb

## 2014-08-23 DIAGNOSIS — M545 Low back pain, unspecified: Secondary | ICD-10-CM

## 2014-08-23 DIAGNOSIS — M549 Dorsalgia, unspecified: Secondary | ICD-10-CM | POA: Insufficient documentation

## 2014-08-23 NOTE — Patient Instructions (Addendum)
Good to see you.   Ice 20 minutes 2 times daily. Usually after activity and before bed. Exercises 3 times a week.  Xrays downstairs today.  Stretch after activity Take tylenol 650 mg three times a day is the best evidence based medicine we have for arthritis.  Glucosamine sulfate 1500mg  twice a day is a supplement that has been shown to help moderate to severe arthritis. Vitamin D 2000 IU daily Fish oil 2 grams daily.  Tumeric 500mg  twice daily.  Capsaicin topically up to four times a day may also help with pain. Cortisone injections are an option if these interventions do not seem to make a difference or need more relief.  If cortisone injections do not help, there are different types of shots that may help but they take longer to take effect.  We can discuss this at follow up.  It's important that you continue to stay active. Shoe inserts with good arch support may be helpful.  [employer] orthotics at [employer] could help.  Water aerobics and cycling with low resistance are the best two types of exercise for arthritis. Come back and see me in 3 weeks.

## 2014-08-23 NOTE — Assessment & Plan Note (Signed)
Patient does have some low back pain overall. We discussed icing regimen and home exercises. We discussed imaging that could be necessary. I think that this could be beneficial to help [place] with differential. Patient does have some mild limitation in range of motion and some mild weakness. Patient could have differential that includes spinal stenosis and further imaging may be necessary. We are still awaiting patient's x-rays. Patient will try the conservative therapy and over-the-counter natural supplementations. Differential also includes a possibility for repeat kidney stones and further imaging may be necessary which would include a CT scan. We discussed that if patient has any signs of symptoms such as fevers chills or abnormal weight loss or any significant radicular symptoms going down the leg or weakness that he'll seek medical attention immediately or call back so advance imaging can be warranted. I do not feel that steroids at this time is necessary especially with patient's history of diabetes. Patient will try to do this conservative therapy and come back and see me again in 3 weeks for further evaluation and treatment.

## 2014-08-23 NOTE — Progress Notes (Signed)
Pre visit review using our clinic review tool, if applicable. No additional management support is needed unless otherwise documented below in the visit note. 

## 2014-08-23 NOTE — Progress Notes (Signed)
[name] [address] [place], [place] [address] Phone: [phone] Subjective:    I'm seeing this patient by the request  of:  [name], MD   CC: Low back pain.  [name] is a 69 y.o. male coming in with complaint of low back pain. Patient has had this is a chronic problem. Patient states that he used to be intermittent and seems to be more constant. Patient states that it seems to be mostly on the left side. States it hurts more with certain activities. States that standing a long amount of time can give him significant amount of pain. Patient states moving certain ways gives him pain as well. Patient does have a past medical history significant for kidney stones multiple years ago but denies of any recent hematuria or painful urination. Patient states that sometimes it does feel that her his legs are heavy but denies any significant radiation of the pain down into the legs. Patient rates the severity of back pain a 7 out of 10 has not responded as well to over-the-counter medications the patient has to avoid these whenever possible. Patient has been trying some exercises for his back that he found online with no significant improvement. Denies any fevers chills or any abnormal weight loss.    Past medical history, social, surgical and family history all reviewed in electronic medical record.  Past Medical History  Diagnosis Date  . GERD (gastroesophageal reflux disease)   . Hyperlipidemia   . Hypertension   . Chronic kidney disease     kidney stone and infection- started 03-27-11  . Asthma     mild, chronic cough  . Diabetes mellitus    Past Surgical History  Procedure Laterality Date  . Appendectomy  1963  . Cystoscopy w/ ureteral stent placement  03/26/11    removal of kidney stone   Family History  Problem Relation Age of Onset  . Hypertension Other   . Coronary artery disease Other   . Heart disease Father 79      MI  . Diabetes Father   . Hypertension Father   . Heart disease Sister 35    CAD  . Diabetes Sister   . Multiple sclerosis Mother   . Parkinsonism Brother   . Hypertension Brother   . Colon cancer Neg Hx   . Esophageal cancer Neg Hx   . Stomach cancer Neg Hx   . Diabetes Brother    History  Substance Use Topics  . Smoking status: Former Smoker -- 1.50 packs/day for 40 years    Types: Cigarettes    Quit date: 05/27/2001  . Smokeless tobacco: Former Systems developer  . Alcohol Use: 6.0 oz/week    10 Glasses of wine per week   Allergies  Allergen Reactions  . Olmesartan     hives  . Symbicort [Budesonide-Formoterol Fumarate]     cough     Review of Systems: No headache, visual changes, nausea, vomiting, diarrhea, constipation, dizziness, abdominal pain, skin rash, fevers, chills, night sweats, weight loss, swollen lymph nodes, body aches, joint swelling, muscle aches, chest pain, shortness of breath, mood changes.   Objective Blood pressure 112/72, pulse 66, height 5\' 6"  (1.676 m), weight 186 lb (84.369 kg), SpO2 93 %.  General: No apparent distress alert and oriented x3 mood and affect normal, dressed appropriately.  HEENT: Pupils equal, extraocular movements intact  Respiratory: Patient's speak in full sentences and does not appear short of breath  Cardiovascular: No lower extremity edema, non tender, no erythema  Skin: Warm dry intact with no signs of infection or rash on extremities or on axial skeleton.  Abdomen: Soft nontender  Neuro: Cranial nerves II through XII are intact, neurovascularly intact in all extremities with 2+ DTRs and 2+ pulses.  Lymph: No lymphadenopathy of posterior or anterior cervical chain or axillae bilaterally.  Gait normal with good balance and coordination.  MSK:  Non tender with full range of motion and good stability and symmetric strength and tone of shoulders, elbows, wrist, hip, knee and ankles bilaterally.  Back Exam:  Inspection: Patient does  have some mild scoliosis of lumbar spine Motion: Flexion 25 deg, Extension 25 deg, Side Bending to 25 deg bilaterally,  Rotation to 25 deg bilaterally  SLR laying: Negative  XSLR laying: Negative  Palpable tenderness: Tender to palpation over the paraspinal musculature of the lumbar spine on the left side. FABER: negative. Sensory change: Gross sensation intact to all lumbar and sacral dermatomes.  Reflexes: 2+ at both patellar tendons, 2+ at achilles tendons, Babinski's downgoing.  Strength at foot  Plantar-flexion: 5/5 Dorsi-flexion: 4/5 Eversion: 4/5 Inversion: 4/5  Leg strength  Quad: 5/5 Hamstring: 5/5 Hip flexor: 4/5 Hip abductors: 4/5 but all strength is symmetric. Gait unremarkable.    Impression and Recommendations:     This case required medical decision making of moderate complexity.

## 2014-08-25 ENCOUNTER — Encounter: Payer: Self-pay | Admitting: Family Medicine

## 2014-09-13 ENCOUNTER — Encounter: Payer: Self-pay | Admitting: Family Medicine

## 2014-09-13 ENCOUNTER — Ambulatory Visit (INDEPENDENT_AMBULATORY_CARE_PROVIDER_SITE_OTHER): Payer: Medicare Other | Admitting: Family Medicine

## 2014-09-13 VITALS — BP 114/74 | HR 57 | Ht 66.0 in | Wt 178.0 lb

## 2014-09-13 DIAGNOSIS — M545 Low back pain, unspecified: Secondary | ICD-10-CM

## 2014-09-13 DIAGNOSIS — M9903 Segmental and somatic dysfunction of lumbar region: Secondary | ICD-10-CM

## 2014-09-13 DIAGNOSIS — M9904 Segmental and somatic dysfunction of sacral region: Secondary | ICD-10-CM | POA: Diagnosis not present

## 2014-09-13 DIAGNOSIS — M999 Biomechanical lesion, unspecified: Secondary | ICD-10-CM | POA: Insufficient documentation

## 2014-09-13 DIAGNOSIS — M9902 Segmental and somatic dysfunction of thoracic region: Secondary | ICD-10-CM | POA: Diagnosis not present

## 2014-09-13 NOTE — Progress Notes (Signed)
[name] [address] [place], [place] [address] Phone: [phone] Subjective:    I'm seeing this patient by the request  of:  [name], MD   CC: Low back pain.  YIR:SWNIOEVOJJ [name] is a 69 y.o. male coming in with complaint of low back pain. Patient did have x-rays at last exam and had very mild osteophytic changes of the lumbar spine. Patient was treated for more conservative measures including home exercises, icing protocol, and natural supplementations. Patient's x-rays were much better than anticipated. Patient does have a past medical history significant for kidney stones as well. Patient states he has unfortunately has not made any significant improvement that he has not been the best patient. Patient has not been doing exercises on a regular basis. Patient states though that he continues to have pain on the backside. Continues to have trouble with walking long distances wear his legs feel fatigued. Denies any significant weakness though. Overall minimal Tinel improvement from previous exam.    Past medical history, social, surgical and family history all reviewed in electronic medical record.  Past Medical History  Diagnosis Date  . GERD (gastroesophageal reflux disease)   . Hyperlipidemia   . Hypertension   . Chronic kidney disease     kidney stone and infection- started 03-27-11  . Asthma     mild, chronic cough  . Diabetes mellitus    Past Surgical History  Procedure Laterality Date  . Appendectomy  1963  . Cystoscopy w/ ureteral stent placement  03/26/11    removal of kidney stone   Family History  Problem Relation Age of Onset  . Hypertension Other   . Coronary artery disease Other   . Heart disease Father 62    MI  . Diabetes Father   . Hypertension Father   . Heart disease Sister 44    CAD  . Diabetes Sister   . Multiple sclerosis Mother   . Parkinsonism Brother   . Hypertension Brother   . Colon cancer  Neg Hx   . Esophageal cancer Neg Hx   . Stomach cancer Neg Hx   . Diabetes Brother    History  Substance Use Topics  . Smoking status: Former Smoker -- 1.50 packs/day for 40 years    Types: Cigarettes    Quit date: 05/27/2001  . Smokeless tobacco: Former Systems developer  . Alcohol Use: 6.0 oz/week    10 Glasses of wine per week   Allergies  Allergen Reactions  . Olmesartan     hives  . Symbicort [Budesonide-Formoterol Fumarate]     cough     Review of Systems: No headache, visual changes, nausea, vomiting, diarrhea, constipation, dizziness, abdominal pain, skin rash, fevers, chills, night sweats, weight loss, swollen lymph nodes, body aches, joint swelling, muscle aches, chest pain, shortness of breath, mood changes.   Objective Blood pressure 114/74, pulse 57, height 5\' 6"  (1.676 m), weight 178 lb (80.74 kg), SpO2 95 %.  General: No apparent distress alert and oriented x3 mood and affect normal, dressed appropriately.  HEENT: Pupils equal, extraocular movements intact  Respiratory: Patient's speak in full sentences and does not appear short of breath  Cardiovascular: No lower extremity edema, non tender, no erythema  Skin: Warm dry intact with no signs of infection or rash on extremities or on axial skeleton.  Abdomen: Soft nontender  Neuro: Cranial nerves II through XII are intact, neurovascularly intact in all extremities with 2+ DTRs and 2+ pulses.  Lymph: No lymphadenopathy of posterior or anterior cervical chain or axillae bilaterally.  Gait normal with good balance and coordination.  MSK:  Non tender with full range of motion and good stability and symmetric strength and tone of shoulders, elbows, wrist, hip, knee and ankles bilaterally.  Back Exam:  Inspection: Patient does have some mild scoliosis of lumbar spine Motion: Flexion 25 deg, Extension 25 deg worse pain with extension over the left lumbar area paraspinally., Side Bending to 25 deg bilaterally,  Rotation to 25 deg  bilaterally  SLR laying: Negative  XSLR laying: Negative  Palpable tenderness: Tender to palpation over the paraspinal musculature of the lumbar spine on the left side still present FABER: negative. Sensory change: Gross sensation intact to all lumbar and sacral dermatomes.  Reflexes: 2+ at both patellar tendons, 2+ at achilles tendons, Babinski's downgoing.  Strength at foot  Plantar-flexion: 5/5 Dorsi-flexion: 4/5 Eversion: 4/5 Inversion: 4/5  Leg strength  Quad: 5/5 Hamstring: 5/5 Hip flexor: 4/5 Hip abductors: 4/5 but all strength is symmetric. Gait unremarkable.  Osteopathic findings Thoracic T5 extended rotated and side bent right T7 extended rotated and side bent left Lumbar L2 flexed rotated and side bent right Sacrum Left on left   Impression and Recommendations:     This case required medical decision making of moderate complexity.

## 2014-09-13 NOTE — Patient Instructions (Addendum)
Good to see you Stop the lipitor for 1-2 weeks and see if it helps CoQ10 400mg  daily Continue the exercises more regularly about 3 times a week Ice at least at night Continue the other vitamins Call me or send note in my chart in 2 weeks. If not better we can consider MRI.  See me in 3 weeks for manipulation

## 2014-09-13 NOTE — Assessment & Plan Note (Signed)
Decision today to treat with OMT was based on Physical Exam  After verbal consent patient was treated with ME, FPR, soft tissue techniques in thoracic, lumbar and sacral areas  Patient tolerated the procedure well with improvement in symptoms  Patient given exercises, stretches and lifestyle modifications  See medications in patient instructions if given  Patient will follow up in 3 weeks

## 2014-09-13 NOTE — Assessment & Plan Note (Signed)
Patient's back pain seems to be still muscular in nature. Patient is on a statin and we will have him hold it for the next 2 weeks. Patient did respond fairly well to osteopathic manipulation. Patient given a trial size of anti-inflammatories to try as well. Discussed with patient that if any side effects such as worsening reflex evening to stop the medicine. We may need to consider corticosteroid injection or trigger point injection and patient continues to have pain. We discussed the potential for advance imaging with patient though having mild arthritis on exam I do not think nerve impingement is high on the differential. Patient will continue to see if he can make some changes as well as weight loss. Patient will come back in 3 weeks and if continuing to do well with the manipulation we will continue that. If worsening will need to consider advanced imaging.

## 2014-09-13 NOTE — Progress Notes (Signed)
Pre visit review using our clinic review tool, if applicable. No additional management support is needed unless otherwise documented below in the visit note. 

## 2014-09-15 ENCOUNTER — Encounter: Payer: Self-pay | Admitting: Family Medicine

## 2014-09-18 ENCOUNTER — Encounter: Payer: Self-pay | Admitting: Family Medicine

## 2014-09-28 ENCOUNTER — Encounter: Payer: Self-pay | Admitting: Family Medicine

## 2014-10-05 ENCOUNTER — Ambulatory Visit: Payer: Medicare Other | Admitting: Family Medicine

## 2014-10-12 ENCOUNTER — Encounter: Payer: Self-pay | Admitting: Family Medicine

## 2014-10-12 ENCOUNTER — Ambulatory Visit (INDEPENDENT_AMBULATORY_CARE_PROVIDER_SITE_OTHER): Payer: Medicare Other | Admitting: Family Medicine

## 2014-10-12 VITALS — BP 124/76 | HR 66 | Wt 180.0 lb

## 2014-10-12 DIAGNOSIS — M999 Biomechanical lesion, unspecified: Secondary | ICD-10-CM | POA: Insufficient documentation

## 2014-10-12 DIAGNOSIS — M9902 Segmental and somatic dysfunction of thoracic region: Secondary | ICD-10-CM | POA: Diagnosis not present

## 2014-10-12 DIAGNOSIS — M9903 Segmental and somatic dysfunction of lumbar region: Secondary | ICD-10-CM

## 2014-10-12 DIAGNOSIS — M9904 Segmental and somatic dysfunction of sacral region: Secondary | ICD-10-CM | POA: Diagnosis not present

## 2014-10-12 DIAGNOSIS — M545 Low back pain, unspecified: Secondary | ICD-10-CM

## 2014-10-12 NOTE — Patient Instructions (Signed)
Good to see you Ice is your friend Have a great trip Take medicine when needed Continue the vitamins Alternate new exercises See me again when you get back/.

## 2014-10-12 NOTE — Progress Notes (Signed)
Pre visit review using our clinic review tool, if applicable. No additional management support is needed unless otherwise documented below in the visit note. 

## 2014-10-12 NOTE — Progress Notes (Signed)
[name] [address] [place], [place] [address] Phone: [phone] Subjective:    CC: Low back pain follow-up  [id number] [name] is a 69 y.o. male coming in with complaint of low back pain. Patient did have x-rays at last exam and had very mild osteophytic changes of the lumbar spine. Patient overall states that he is doing relatively better. Patient has actually stopped the statin which has also made some mild improvement. Patient states only sitting for long amount of time seems to give him any significant discomfort. Denies any tingling that radiates down his legs. Patient states that he is approximately 60% better and is doing exercises on a fairly regular basis. Patient states seems to be where his mid and lower back meters where he has some of the discomfort.   Past medical history, social, surgical and family history all reviewed in electronic medical record.  Past Medical History  Diagnosis Date  . GERD (gastroesophageal reflux disease)   . Hyperlipidemia   . Hypertension   . Chronic kidney disease     kidney stone and infection- started 03-27-11  . Asthma     mild, chronic cough  . Diabetes mellitus    Past Surgical History  Procedure Laterality Date  . Appendectomy  1963  . Cystoscopy w/ ureteral stent placement  03/26/11    removal of kidney stone   Family History  Problem Relation Age of Onset  . Hypertension Other   . Coronary artery disease Other   . Heart disease Father 60    MI  . Diabetes Father   . Hypertension Father   . Heart disease Sister 36    CAD  . Diabetes Sister   . Multiple sclerosis Mother   . Parkinsonism Brother   . Hypertension Brother   . Colon cancer Neg Hx   . Esophageal cancer Neg Hx   . Stomach cancer Neg Hx   . Diabetes Brother    History  Substance Use Topics  . Smoking status: Former Smoker -- 1.50 packs/day for 40 years    Types: Cigarettes    Quit date: 05/27/2001  .  Smokeless tobacco: Former Systems developer  . Alcohol Use: 6.0 oz/week    10 Glasses of wine per week   Allergies  Allergen Reactions  . Olmesartan     hives  . Symbicort [Budesonide-Formoterol Fumarate]     cough     Review of Systems: No headache, visual changes, nausea, vomiting, diarrhea, constipation, dizziness, abdominal pain, skin rash, fevers, chills, night sweats, weight loss, swollen lymph nodes, body aches, joint swelling, muscle aches, chest pain, shortness of breath, mood changes.   Objective Blood pressure 124/76, pulse 66, weight 180 lb (81.647 kg), SpO2 90 %.  General: No apparent distress alert and oriented x3 mood and affect normal, dressed appropriately.  HEENT: Pupils equal, extraocular movements intact  Respiratory: Patient's speak in full sentences and does not appear short of breath  Cardiovascular: No lower extremity edema, non tender, no erythema  Skin: Warm dry intact with no signs of infection or rash on extremities or on axial skeleton.  Abdomen: Soft nontender  Neuro: Cranial nerves II through XII are intact, neurovascularly intact in all extremities with 2+ DTRs and 2+ pulses.  Lymph: No lymphadenopathy of posterior or anterior cervical chain or axillae bilaterally.  Gait normal with good balance and coordination.  MSK:  Non tender with full range of motion and good stability and symmetric strength and tone  of shoulders, elbows, wrist, hip, knee and ankles bilaterally.  Back Exam:  Inspection: Patient does have some mild scoliosis of lumbar spine Motion: Flexion 25 deg, Extension 25 deg no pain, Side Bending to 25 deg bilaterally,  Rotation to 25 deg bilaterally  SLR laying: Negative  XSLR laying: Negative  Palpable tenderness: Tender to palpation over the paraspinal musculature of the lumbar spine on the left side mostly at the thoracolumbar junction FABER: negative. Sensory change: Gross sensation intact to all lumbar and sacral dermatomes.  Reflexes: 2+ at  both patellar tendons, 2+ at achilles tendons, Babinski's downgoing.  Strength at foot  Plantar-flexion: 5/5 Dorsi-flexion: 4/5 Eversion: 4/5 Inversion: 4/5  Leg strength  Quad: 5/5 Hamstring: 5/5 Hip flexor: 4/5 Hip abductors: 4/5 but all strength is symmetric. Gait unremarkable. Moderate improvement from previous exam Osteopathic findings Thoracic T5 extended rotated and side bent right T7 extended rotated and side bent left Lumbar L2 flexed rotated and side bent right Sacrum Left on left   Impression and Recommendations:     This case required medical decision making of moderate complexity.

## 2014-10-12 NOTE — Assessment & Plan Note (Signed)
Decision today to treat with OMT was based on Physical Exam  After verbal consent patient was treated with ME, FPR, soft tissue techniques in thoracic, lumbar and sacral areas  Patient tolerated the procedure well with improvement in symptoms  Patient given exercises, stretches and lifestyle modifications  See medications in patient instructions if given  Patient will follow up in 4-6 weeks

## 2014-10-12 NOTE — Assessment & Plan Note (Signed)
Patient's back pain seems to be improving. With patient having very mild to moderate osteophytic changes underlying I do think that most of this is musculoskeletal in nature. Patient is going to continue to monitor his reaction to the statin and see if this is improving or not. Discussed continuing some of the over-the-counter natural supplementations. Patient given phase II exercises and a handout today. Patient will continue the other exercises and alternate daily. Patient and will come back and see me again in 4-6 weeks for further evaluation and treatment. Do not feel that advance imaging is warranted numbness radicular symptoms occur. Patient does have tramadol for breakthrough pain.

## 2014-10-17 ENCOUNTER — Encounter: Payer: Self-pay | Admitting: Internal Medicine

## 2014-10-18 ENCOUNTER — Encounter: Payer: Self-pay | Admitting: Internal Medicine

## 2014-10-19 ENCOUNTER — Encounter: Payer: Self-pay | Admitting: Internal Medicine

## 2014-10-31 ENCOUNTER — Encounter: Payer: Self-pay | Admitting: Internal Medicine

## 2014-11-01 ENCOUNTER — Other Ambulatory Visit: Payer: Self-pay | Admitting: Internal Medicine

## 2014-11-01 ENCOUNTER — Other Ambulatory Visit (INDEPENDENT_AMBULATORY_CARE_PROVIDER_SITE_OTHER): Payer: Medicare Other

## 2014-11-01 DIAGNOSIS — R300 Dysuria: Secondary | ICD-10-CM

## 2014-11-01 DIAGNOSIS — E119 Type 2 diabetes mellitus without complications: Secondary | ICD-10-CM

## 2014-11-01 LAB — URINALYSIS
Bilirubin Urine: NEGATIVE
Hgb urine dipstick: NEGATIVE
KETONES UR: NEGATIVE
Nitrite: NEGATIVE
Specific Gravity, Urine: 1.015 (ref 1.000–1.030)
Total Protein, Urine: NEGATIVE
UROBILINOGEN UA: 0.2 (ref 0.0–1.0)
Urine Glucose: NEGATIVE
pH: 7 (ref 5.0–8.0)

## 2014-11-01 LAB — BASIC METABOLIC PANEL
BUN: 10 mg/dL (ref 6–23)
CALCIUM: 9.1 mg/dL (ref 8.4–10.5)
CHLORIDE: 101 meq/L (ref 96–112)
CO2: 29 meq/L (ref 19–32)
Creatinine, Ser: 0.78 mg/dL (ref 0.40–1.50)
GFR: 104.45 mL/min (ref >60.00)
GLUCOSE: 126 mg/dL — AB (ref 70–99)
POTASSIUM: 4.9 meq/L (ref 3.5–5.1)
Sodium: 136 mEq/L (ref 135–145)

## 2014-11-01 LAB — HEMOGLOBIN A1C: Hgb A1c MFr Bld: 6.4 % (ref 4.6–6.5)

## 2014-11-01 LAB — URINALYSIS, ROUTINE W REFLEX MICROSCOPIC
BILIRUBIN URINE: NEGATIVE
Hgb urine dipstick: NEGATIVE
KETONES UR: NEGATIVE
Nitrite: NEGATIVE
PH: 7 (ref 5.0–8.0)
SPECIFIC GRAVITY, URINE: 1.015 (ref 1.000–1.030)
Total Protein, Urine: NEGATIVE
Urine Glucose: NEGATIVE
Urobilinogen, UA: 0.2 (ref 0.0–1.0)

## 2014-11-01 MED ORDER — CIPROFLOXACIN HCL 500 MG PO TABS: 500.0000 mg | ORAL_TABLET | Freq: Two times a day (BID) | ORAL | Status: DC

## 2014-11-03 ENCOUNTER — Telehealth: Payer: Self-pay | Admitting: *Deleted

## 2014-11-03 MED ORDER — TRAMADOL HCL 50 MG PO TABS: 50.0000 mg | ORAL_TABLET | Freq: Two times a day (BID) | ORAL | Status: DC | PRN

## 2014-11-03 NOTE — Telephone Encounter (Signed)
Rf req for Tramadol 50 mg 1-2 po 12 hours prn pain. # 180. Ok to Rf?

## 2014-11-03 NOTE — Telephone Encounter (Signed)
OK to fill this prescription with additional refills x1 Thank you!  

## 2014-11-03 NOTE — Telephone Encounter (Signed)
Rf phoned in. See meds.  

## 2014-11-17 ENCOUNTER — Ambulatory Visit (INDEPENDENT_AMBULATORY_CARE_PROVIDER_SITE_OTHER): Payer: Medicare Other | Admitting: Family Medicine

## 2014-11-17 ENCOUNTER — Encounter: Payer: Self-pay | Admitting: Family Medicine

## 2014-11-17 ENCOUNTER — Encounter: Payer: Self-pay | Admitting: Internal Medicine

## 2014-11-17 ENCOUNTER — Ambulatory Visit (INDEPENDENT_AMBULATORY_CARE_PROVIDER_SITE_OTHER): Payer: Medicare Other | Admitting: Internal Medicine

## 2014-11-17 VITALS — BP 112/80 | HR 54 | Ht 66.0 in | Wt 176.0 lb

## 2014-11-17 VITALS — BP 112/80 | HR 54 | Wt 176.0 lb

## 2014-11-17 DIAGNOSIS — M5440 Lumbago with sciatica, unspecified side: Secondary | ICD-10-CM | POA: Diagnosis not present

## 2014-11-17 DIAGNOSIS — E119 Type 2 diabetes mellitus without complications: Secondary | ICD-10-CM | POA: Diagnosis not present

## 2014-11-17 DIAGNOSIS — M545 Low back pain, unspecified: Secondary | ICD-10-CM

## 2014-11-17 DIAGNOSIS — I10 Essential (primary) hypertension: Secondary | ICD-10-CM | POA: Diagnosis not present

## 2014-11-17 DIAGNOSIS — E785 Hyperlipidemia, unspecified: Secondary | ICD-10-CM

## 2014-11-17 DIAGNOSIS — M9904 Segmental and somatic dysfunction of sacral region: Secondary | ICD-10-CM

## 2014-11-17 DIAGNOSIS — M9902 Segmental and somatic dysfunction of thoracic region: Secondary | ICD-10-CM | POA: Diagnosis not present

## 2014-11-17 DIAGNOSIS — M999 Biomechanical lesion, unspecified: Secondary | ICD-10-CM

## 2014-11-17 DIAGNOSIS — M9903 Segmental and somatic dysfunction of lumbar region: Secondary | ICD-10-CM | POA: Diagnosis not present

## 2014-11-17 MED ORDER — MEGARED OMEGA-3 KRILL OIL 500 MG PO CAPS: 1.0000 | ORAL_CAPSULE | Freq: Every morning | ORAL | Status: DC

## 2014-11-17 MED ORDER — METFORMIN HCL ER 750 MG PO TB24: 750.0000 mg | ORAL_TABLET | Freq: Every day | ORAL | Status: DC

## 2014-11-17 NOTE — Assessment & Plan Note (Signed)
Krill oil 

## 2014-11-17 NOTE — Progress Notes (Signed)
[name] [address] [place], [place] [address] Phone: [phone] Subjective:    CC: Low back pain follow-up  [id number] [name] is a 69 y.o. male coming in with complaint of low back pain. Patient did have x-rays at last exam and had very mild osteophytic changes of the lumbar spine. Patient was doing much better for quite some time and states that the flares or less and less. Patient even when driving for 4 days and tendency and states that he had very mild tightness. Still in the morning as well as in the late evening she has some discomfort. Nothing that is radiating down his leg and nothing that stops him from activities.   Past medical history, social, surgical and family history all reviewed in electronic medical record.  Past Medical History  Diagnosis Date  . GERD (gastroesophageal reflux disease)   . Hyperlipidemia   . Hypertension   . Chronic kidney disease     kidney stone and infection- started 03-27-11  . Asthma     mild, chronic cough  . Diabetes mellitus    Past Surgical History  Procedure Laterality Date  . Appendectomy  1963  . Cystoscopy w/ ureteral stent placement  03/26/11    removal of kidney stone   Family History  Problem Relation Age of Onset  . Hypertension Other   . Coronary artery disease Other   . Heart disease Father 63    MI  . Diabetes Father   . Hypertension Father   . Heart disease Sister 68    CAD  . Diabetes Sister   . Multiple sclerosis Mother   . Parkinsonism Brother   . Hypertension Brother   . Colon cancer Neg Hx   . Esophageal cancer Neg Hx   . Stomach cancer Neg Hx   . Diabetes Brother    History  Substance Use Topics  . Smoking status: Former Smoker -- 1.50 packs/day for 40 years    Types: Cigarettes    Quit date: 05/27/2001  . Smokeless tobacco: Former Systems developer  . Alcohol Use: 6.0 oz/week    10 Glasses of wine per week   Allergies  Allergen Reactions  . Olmesartan    hives  . Symbicort [Budesonide-Formoterol Fumarate]     cough     Review of Systems: No headache, visual changes, nausea, vomiting, diarrhea, constipation, dizziness, abdominal pain, skin rash, fevers, chills, night sweats, weight loss, swollen lymph nodes, body aches, joint swelling, muscle aches, chest pain, shortness of breath, mood changes.   Objective Blood pressure 112/80, pulse 54, height 5\' 6"  (1.676 m), weight 176 lb (79.833 kg), SpO2 94 %.  General: No apparent distress alert and oriented x3 mood and affect normal, dressed appropriately.  HEENT: Pupils equal, extraocular movements intact  Respiratory: Patient's speak in full sentences and does not appear short of breath  Cardiovascular: No lower extremity edema, non tender, no erythema  Skin: Warm dry intact with no signs of infection or rash on extremities or on axial skeleton.  Abdomen: Soft nontender  Neuro: Cranial nerves II through XII are intact, neurovascularly intact in all extremities with 2+ DTRs and 2+ pulses.  Lymph: No lymphadenopathy of posterior or anterior cervical chain or axillae bilaterally.  Gait normal with good balance and coordination.  MSK:  Non tender with full range of motion and good stability and symmetric strength and tone of shoulders, elbows, wrist, hip, knee and ankles bilaterally.  Back Exam:  Inspection: Patient  does have some mild scoliosis of lumbar spine Motion: Flexion 25 deg, Extension 25 deg no pain, Side Bending to 25 deg bilaterally,  Rotation to 25 deg bilaterally  SLR laying: Negative  XSLR laying: Negative  Palpable tenderness: Less tender over the thoracolumbar and lumbar sacral junctures.  FABER: negative. Sensory change: Gross sensation intact to all lumbar and sacral dermatomes.  Reflexes: 2+ at both patellar tendons, 2+ at achilles tendons, Babinski's downgoing.  Strength at foot  Plantar-flexion: 5/5 Dorsi-flexion: 4/5 Eversion: 4/5 Inversion: 4/5  Leg strength  Quad: 5/5  Hamstring: 5/5 Hip flexor: 4/5 Hip abductors: 4+/5 mild improvement in strength  Osteopathic findings Thoracic T5 extended rotated and side bent right T7 extended rotated and side bent left Lumbar L2 flexed rotated and side bent right Sacrum Left on left  same pattern as previous.  Impression and Recommendations:     This case required medical decision making of moderate complexity.

## 2014-11-17 NOTE — Progress Notes (Signed)
   Subjective:     HPI  The patient is here for a wellness exam. The patient has been doing well overall without major physical or psychological issues going on lately.  F/u a new onset of DM2 - compensated; better now with Rx prescribed  C/o insomnia  CBGs 75-115 lately; urinating nl  The patient presents for a follow-up of  chronic hypertension, chronic dyslipidemia, hydronephrosis controlled with medicines   Wt Readings from Last 3 Encounters:  11/17/14 176 lb (79.833 kg)  11/17/14 176 lb (79.833 kg)  10/12/14 180 lb (81.647 kg)   BP Readings from Last 3 Encounters:  11/17/14 112/80  11/17/14 112/80  10/12/14 124/76        Review of Systems  Constitutional: Negative for appetite change, fatigue and unexpected weight change.  HENT: Negative for congestion, nosebleeds, sneezing, sore throat and trouble swallowing.   Eyes: Negative for itching and visual disturbance.  Respiratory: Negative for cough.   Cardiovascular: Negative for chest pain, palpitations and leg swelling.  Gastrointestinal: Negative for diarrhea, blood in stool and abdominal distention.  Musculoskeletal: Negative for back pain, joint swelling, gait problem and neck pain.  Skin: Negative for rash.  Neurological: Negative for dizziness, tremors, speech difficulty and weakness.  Psychiatric/Behavioral: Negative for sleep disturbance, dysphoric mood and agitation. The patient is not nervous/anxious.        Objective:   Physical Exam  Constitutional: He is oriented to person, place, and time. He appears well-developed. No distress.  NAD  HENT:  Mouth/Throat: Oropharynx is clear and moist.  Eyes: Conjunctivae are normal. Pupils are equal, round, and reactive to light.  Neck: Normal range of motion. No JVD present. No thyromegaly present.  Cardiovascular: Normal rate, regular rhythm, normal heart sounds and intact distal pulses.  Exam reveals no gallop and no friction rub.   No murmur  heard. Pulmonary/Chest: Effort normal and breath sounds normal. No respiratory distress. He has no wheezes. He has no rales. He exhibits no tenderness.  Abdominal: Soft. Bowel sounds are normal. He exhibits no distension and no mass. There is no tenderness. There is no rebound and no guarding.  Musculoskeletal: Normal range of motion. He exhibits no edema or tenderness.  Lymphadenopathy:    He has no cervical adenopathy.  Neurological: He is alert and oriented to person, place, and time. He has normal reflexes. No cranial nerve deficit. He exhibits normal muscle tone. He displays a negative Romberg sign. Coordination and gait normal.  Skin: Skin is warm and dry. No rash noted.  Psychiatric: He has a normal mood and affect. His behavior is normal. Judgment and thought content normal.   Lab Results  Component Value Date   WBC 5.5 03/22/2014   HGB 16.1 03/22/2014   HCT 47.3 03/22/2014   PLT 198.0 03/22/2014   GLUCOSE 126* 11/01/2014   CHOL 169 03/22/2014   TRIG 54.0 03/22/2014   HDL 81.00 03/22/2014   LDLCALC 77 03/22/2014   ALT 26 03/22/2014   AST 27 03/22/2014   NA 136 11/01/2014   K 4.9 11/01/2014   CL 101 11/01/2014   CREATININE 0.78 11/01/2014   BUN 10 11/01/2014   CO2 29 11/01/2014   TSH 1.38 03/22/2014   PSA 1.03 03/22/2014   HGBA1C 6.4 11/01/2014   MICROALBUR 1.0 02/22/2011       Assessment & Plan:

## 2014-11-17 NOTE — Progress Notes (Signed)
Pre visit review using our clinic review tool, if applicable. No additional management support is needed unless otherwise documented below in the visit note. 

## 2014-11-17 NOTE — Assessment & Plan Note (Signed)
Patient continues to do remarkably well. Patient still has some tightness overall. We discussed continuing the conservative therapy and the home exercises more regularly. Patient will continue the vitamin D supplementation but he is looking to get off the fish oil just dictated past number of medications a steak and regularly. Patient has had improvement with Tylenol and we'll continue this at least 3 times a day. If patient has any exacerbations of pain he'll come back and see me sooner otherwise patient can follow-up on an as-needed basis.

## 2014-11-17 NOTE — Assessment & Plan Note (Signed)
MSK/OA On Tramadol prn  Potential benefits of a long term opioids use as well as potential risks (i.e. addiction risk, apnea etc) and complications (i.e. Somnolence, constipation and others) were explained to the patient and were aknowledged.

## 2014-11-17 NOTE — Assessment & Plan Note (Signed)
Decision today to treat with OMT was based on Physical Exam  After verbal consent patient was treated with ME, FPR, soft tissue techniques in thoracic, lumbar and sacral areas  Patient tolerated the procedure well with improvement in symptoms  Patient given exercises, stretches and lifestyle modifications  See medications in patient instructions if given  Patient will follow up in 4-6 weeks

## 2014-11-17 NOTE — Patient Instructions (Signed)
Good to see you Continue the vitamins +/- fish oil Ice is your friend Stay active See me again when you need me.

## 2014-11-17 NOTE — Assessment & Plan Note (Addendum)
We can switch from Valliant (too $$) and start Metfomin XR, cont Actos

## 2014-11-17 NOTE — Assessment & Plan Note (Signed)
On Coreg, Amlodipine

## 2014-11-21 ENCOUNTER — Encounter: Payer: Self-pay | Admitting: Internal Medicine

## 2014-11-21 ENCOUNTER — Other Ambulatory Visit: Payer: Self-pay

## 2014-12-16 ENCOUNTER — Encounter: Payer: Self-pay | Admitting: Gastroenterology

## 2014-12-19 ENCOUNTER — Other Ambulatory Visit: Payer: Self-pay | Admitting: *Deleted

## 2014-12-19 MED ORDER — AMLODIPINE BESYLATE 10 MG PO TABS: 10.0000 mg | ORAL_TABLET | Freq: Every day | ORAL | Status: DC

## 2014-12-30 ENCOUNTER — Encounter: Payer: Self-pay | Admitting: Internal Medicine

## 2014-12-30 ENCOUNTER — Other Ambulatory Visit: Payer: Self-pay | Admitting: Internal Medicine

## 2014-12-30 MED ORDER — CIPROFLOXACIN HCL 500 MG PO TABS: 500.0000 mg | ORAL_TABLET | Freq: Two times a day (BID) | ORAL | Status: DC

## 2014-12-30 NOTE — Progress Notes (Unsigned)
I emailed Cipro. See you on Tue Feel  Better! AP

## 2015-01-02 MED ORDER — CIPROFLOXACIN HCL 500 MG PO TABS: 500.0000 mg | ORAL_TABLET | Freq: Two times a day (BID) | ORAL | Status: DC

## 2015-01-16 LAB — HM DIABETES EYE EXAM

## 2015-01-24 ENCOUNTER — Telehealth: Payer: Self-pay | Admitting: *Deleted

## 2015-01-24 NOTE — Telephone Encounter (Signed)
OK to fill this prescription with additional refills x2 Thank you!  

## 2015-01-24 NOTE — Telephone Encounter (Signed)
rf req for Alprazolam 0.5 mg 1-2 po qhs prn anxiety or sleep. Ok to Rf?

## 2015-01-25 MED ORDER — ALPRAZOLAM 0.5 MG PO TABS: 0.5000 mg | ORAL_TABLET | Freq: Every evening | ORAL | Status: DC | PRN

## 2015-01-25 NOTE — Telephone Encounter (Signed)
Done. See meds.  

## 2015-01-26 LAB — HM DIABETES EYE EXAM

## 2015-02-12 ENCOUNTER — Other Ambulatory Visit: Payer: Self-pay | Admitting: Internal Medicine

## 2015-02-13 NOTE — Telephone Encounter (Signed)
[facility] is following uop on this med. She states patient took his last meds over the weekend. He insists that he needs it today

## 2015-03-28 ENCOUNTER — Other Ambulatory Visit (INDEPENDENT_AMBULATORY_CARE_PROVIDER_SITE_OTHER): Payer: Medicare Other

## 2015-03-28 DIAGNOSIS — E119 Type 2 diabetes mellitus without complications: Secondary | ICD-10-CM

## 2015-03-28 LAB — BASIC METABOLIC PANEL
BUN: 15 mg/dL (ref 6–23)
CHLORIDE: 103 meq/L (ref 96–112)
CO2: 29 mEq/L (ref 19–32)
CREATININE: 0.8 mg/dL (ref 0.40–1.50)
Calcium: 9.3 mg/dL (ref 8.4–10.5)
GFR: 101.32 mL/min (ref >60.00)
Glucose, Bld: 111 mg/dL — ABNORMAL HIGH (ref 70–99)
Potassium: 4.4 mEq/L (ref 3.5–5.1)
Sodium: 141 mEq/L (ref 135–145)

## 2015-03-28 LAB — HEMOGLOBIN A1C: Hgb A1c MFr Bld: 6.4 % (ref 4.6–6.5)

## 2015-04-04 ENCOUNTER — Encounter: Payer: Self-pay | Admitting: Internal Medicine

## 2015-04-04 ENCOUNTER — Ambulatory Visit (INDEPENDENT_AMBULATORY_CARE_PROVIDER_SITE_OTHER): Payer: Medicare Other | Admitting: Internal Medicine

## 2015-04-04 VITALS — BP 130/88 | HR 56 | Ht 66.0 in | Wt 181.0 lb

## 2015-04-04 DIAGNOSIS — R05 Cough: Secondary | ICD-10-CM

## 2015-04-04 DIAGNOSIS — I10 Essential (primary) hypertension: Secondary | ICD-10-CM | POA: Diagnosis not present

## 2015-04-04 DIAGNOSIS — E119 Type 2 diabetes mellitus without complications: Secondary | ICD-10-CM | POA: Diagnosis not present

## 2015-04-04 DIAGNOSIS — Z23 Encounter for immunization: Secondary | ICD-10-CM

## 2015-04-04 DIAGNOSIS — N320 Bladder-neck obstruction: Secondary | ICD-10-CM | POA: Diagnosis not present

## 2015-04-04 DIAGNOSIS — Z0000 Encounter for general adult medical examination without abnormal findings: Secondary | ICD-10-CM

## 2015-04-04 DIAGNOSIS — R059 Cough, unspecified: Secondary | ICD-10-CM

## 2015-04-04 NOTE — Assessment & Plan Note (Signed)
On Coreg, Amlodipine

## 2015-04-04 NOTE — Patient Instructions (Signed)
Agave syrup in place of sugar

## 2015-04-04 NOTE — Progress Notes (Signed)
Pre visit review using our clinic review tool, if applicable. No additional management support is needed unless otherwise documented below in the visit note. 

## 2015-04-04 NOTE — Assessment & Plan Note (Signed)
Metfomin XR and Actos

## 2015-04-04 NOTE — Progress Notes (Signed)
Subjective:  Patient ID: [name], male    DOB: 12/12/1945  Age: 69 y.o. MRN: [id number]  CC: Annual Exam   HPI [name] presents for a well exam  Outpatient Prescriptions Prior to Visit  Medication Sig Dispense Refill  . acetaminophen (TYLENOL) 650 MG CR tablet Take 650 mg by mouth 3 (three) times daily as needed for pain.    [name] ALPRAZolam (XANAX) 0.5 MG tablet Take 1-2 tablets (0.5-1 mg total) by mouth at bedtime as needed for anxiety or sleep. 60 tablet 2  . amLODipine (NORVASC) 10 MG tablet Take 1 tablet (10 mg total) by mouth daily before breakfast. (Patient taking differently: Take 5 mg by mouth daily before breakfast. ) 90 tablet 1  . carvedilol (COREG) 25 MG tablet TAKE 1 TABLET BY MOUTH TWICE A DAY WITH MEALS 180 tablet 2  . Cholecalciferol (VITAMIN D3) 1000 UNITS tablet Take 2,000 Units by mouth daily.     . Coenzyme Q10 (CO Q-10) 200 MG CAPS Take 2 capsules by mouth daily.    . Folic Acid 0.8 MG CAPS Take 2 capsules by mouth every morning.  30 each 0  . MEGARED OMEGA-3 KRILL OIL 500 MG CAPS Take 1 capsule by mouth every morning. 100 capsule 3  . metFORMIN (GLUCOPHAGE XR) 750 MG 24 hr tablet Take 1 tablet (750 mg total) by mouth daily with breakfast. 90 tablet 3  . ONE TOUCH ULTRA TEST test strip TEST TWICE A DAY AS DIRECTED 100 each 3  . ONETOUCH DELICA LANCETS 09Q MISC USE AS DIRECTED 100 each 3  . traMADol (ULTRAM) 50 MG tablet Take 1-2 tablets (50-100 mg total) by mouth every 12 (twelve) hours as needed for moderate pain or severe pain. 180 tablet 1  . traZODone (DESYREL) 50 MG tablet Take 1-2 tablets (50-100 mg total) by mouth at bedtime as needed for sleep. 60 tablet 5  . Turmeric 500 MG CAPS Take 1 capsule by mouth 2 (two) times daily.    . ciprofloxacin (CIPRO) 500 MG tablet Take 1 tablet (500 mg total) by mouth 2 (two) times daily. 20 tablet 0  . pioglitazone (ACTOS) 45 MG tablet Take 22.5 mg by mouth every other day.     No facility-administered  medications prior to visit.    ROS Review of Systems  Constitutional: Negative for chills, appetite change, fatigue and unexpected weight change.  HENT: Negative for congestion, nosebleeds, sneezing, sore throat and trouble swallowing.   Eyes: Negative for itching and visual disturbance.  Respiratory: Negative for cough.   Cardiovascular: Negative for chest pain, palpitations and leg swelling.  Gastrointestinal: Negative for nausea, diarrhea, blood in stool and abdominal distention.  Genitourinary: Negative for frequency and hematuria.  Musculoskeletal: Negative for back pain, joint swelling, gait problem and neck pain.  Skin: Negative for rash.  Neurological: Negative for dizziness, tremors, speech difficulty and weakness.  Psychiatric/Behavioral: Negative for suicidal ideas, sleep disturbance, dysphoric mood and agitation. The patient is not nervous/anxious.     Objective:  BP 130/88 mmHg  Pulse 56  Ht 5\' 6"  (1.676 m)  Wt 181 lb (82.101 kg)  BMI 29.23 kg/m2  SpO2 95%  BP Readings from Last 3 Encounters:  04/04/15 130/88  11/17/14 112/80  11/17/14 112/80    Wt Readings from Last 3 Encounters:  04/04/15 181 lb (82.101 kg)  11/17/14 176 lb (79.833 kg)  11/17/14 176 lb (79.833 kg)    Physical Exam  Constitutional: He is oriented to person, place, and  time. He appears well-developed and well-nourished. No distress.  HENT:  Head: Normocephalic and atraumatic.  Right Ear: External ear normal.  Left Ear: External ear normal.  Nose: Nose normal.  Mouth/Throat: Oropharynx is clear and moist. No oropharyngeal exudate.  Eyes: Conjunctivae and EOM are normal. Pupils are equal, round, and reactive to light. Right eye exhibits no discharge. Left eye exhibits no discharge. No scleral icterus.  Neck: Normal range of motion. Neck supple. No JVD present. No tracheal deviation present. No thyromegaly present.  Cardiovascular: Normal rate, regular rhythm, normal heart sounds and intact  distal pulses.  Exam reveals no gallop and no friction rub.   No murmur heard. Pulmonary/Chest: Effort normal and breath sounds normal. No stridor. No respiratory distress. He has no wheezes. He has no rales. He exhibits no tenderness.  Abdominal: Soft. Bowel sounds are normal. He exhibits no distension and no mass. There is no tenderness. There is no rebound and no guarding.  Genitourinary: Rectum normal, prostate normal and penis normal. Guaiac negative stool. No penile tenderness.  Musculoskeletal: Normal range of motion. He exhibits no edema or tenderness.  Lymphadenopathy:    He has no cervical adenopathy.  Neurological: He is alert and oriented to person, place, and time. He has normal reflexes. No cranial nerve deficit. He exhibits normal muscle tone. Coordination normal.  Skin: Skin is warm and dry. No rash noted. He is not diaphoretic. No erythema. No pallor.  Psychiatric: He has a normal mood and affect. His behavior is normal. Judgment and thought content normal.    Lab Results  Component Value Date   WBC 5.5 03/22/2014   HGB 16.1 03/22/2014   HCT 47.3 03/22/2014   PLT 198.0 03/22/2014   GLUCOSE 111* 03/28/2015   CHOL 169 03/22/2014   TRIG 54.0 03/22/2014   HDL 81.00 03/22/2014   LDLCALC 77 03/22/2014   ALT 26 03/22/2014   AST 27 03/22/2014   NA 141 03/28/2015   K 4.4 03/28/2015   CL 103 03/28/2015   CREATININE 0.80 03/28/2015   BUN 15 03/28/2015   CO2 29 03/28/2015   TSH 1.38 03/22/2014   PSA 1.03 03/22/2014   HGBA1C 6.4 03/28/2015   MICROALBUR 1.0 02/22/2011    Dg Lumbar Spine Complete  08/23/2014  CLINICAL DATA:  One month of low back pain predominantly left-sided, no known injury EXAM: LUMBAR SPINE - COMPLETE 4+ VIEW COMPARISON:  Coronal and sagittal reconstructed images from an abdominal and pelvic CT scan dated Sep 29, 2011 FINDINGS: The lumbar vertebral bodies are preserved in height. There is mild disc space narrowing at L1-2 and at L2-3. There are anterior  endplate osteophytes at multiple levels. There is no spondylolisthesis. There is facet joint hypertrophy at L4-5 and L5-S1. IMPRESSION: There is mild degenerative disc space narrowing at L1-2 and L2-3. There is facet joint hypertrophy at L4-5 and L5-S1. There is no compression fracture nor acute bony abnormality. Electronically Signed   By: David  [place]   On: 08/23/2014 16:07   Dg Pelvis 1-2 Views  08/23/2014  CLINICAL DATA:  One month of low back pain, predominantly left-sided, without known injury EXAM: PELVIS - 1-2 VIEW COMPARISON:  KUB of August 13, 2011 FINDINGS: The bony pelvis is adequately mineralized. There is mild stable symmetric narrowing of the hip joint spaces. There is no lytic or blastic lesion. The observed portions of the sacrum and SI joints are unremarkable. IMPRESSION: There is no acute bony abnormality of the pelvis. Electronically Signed   By: Shanon Brow  [place]   On: 08/23/2014 16:08    Assessment & Plan:   Tadeusz was seen today for annual exam.  Diagnoses and all orders for this visit:  Well adult exam -     TSH; Future -     Urinalysis; Future -     CBC with Differential/Platelet; Future -     Basic metabolic panel; Future -     Hemoglobin A1c; Future -     Hepatic function panel; Future -     Lipid panel; Future -     PSA; Future -     Microalbumin / creatinine urine ratio; Future -     CT CHEST LUNG CA SCREEN LOW DOSE W/O CM; Future  Controlled type 2 diabetes mellitus without complication, without long-term current use of insulin (HCC) -     TSH; Future -     Urinalysis; Future -     CBC with Differential/Platelet; Future -     Basic metabolic panel; Future -     Hemoglobin A1c; Future -     Hepatic function panel; Future -     Lipid panel; Future -     PSA; Future -     Microalbumin / creatinine urine ratio; Future  Essential hypertension -     TSH; Future -     Urinalysis; Future -     CBC with Differential/Platelet; Future -     Basic metabolic  panel; Future -     Hemoglobin A1c; Future -     Hepatic function panel; Future -     Lipid panel; Future -     PSA; Future -     Microalbumin / creatinine urine ratio; Future  Bladder neck obstruction -     PSA; Future  Cough -     CT CHEST LUNG CA SCREEN LOW DOSE W/O CM; Future  Need for influenza vaccination -     Flu Vaccine QUAD 36+ mos IM  I have discontinued Mr. Gehlhausen ciprofloxacin. I am also having him maintain his cholecalciferol, Folic Acid, pioglitazone, ONETOUCH DELICA LANCETS 10G, ONE TOUCH ULTRA TEST, traZODone, traMADol, Co Q-10, acetaminophen, Turmeric, MEGARED OMEGA-3 KRILL OIL, metFORMIN, amLODipine, ALPRAZolam, carvedilol, and aspirin EC.  Meds ordered this encounter  Medications  . aspirin EC 81 MG tablet    Sig: Take 81 mg by mouth daily.     Follow-up: Return in about 4 months (around 08/02/2015) for a follow-up visit.  [name], MD

## 2015-04-04 NOTE — Assessment & Plan Note (Signed)
Here for medicare wellness/physical  Diet: heart healthy  Physical activity: not sedentary  Depression/mood screen: negative  Hearing: decreased Visual acuity: grossly normal, performs annual eye exam  ADLs: capable  Fall risk: low to none  Home safety: good  Cognitive evaluation: intact to orientation, naming, recall and repetition  EOL planning: adv directives, full code/ I agree  I have personally reviewed and have noted  1. The patient's medical, surgical and social history  2. Their use of alcohol, tobacco or illicit drugs  3. Their current medications and supplements  4. The patient's functional ability including ADL's, fall risks, home safety risks and hearing or visual impairment.  5. Diet and physical activities  6. Evidence for depression or mood disorders 7. The roster of all physicians providing medical care to patient - is listed in the Snapshot section of the chart and reviewed today.    Today patient counseled on age appropriate routine health concerns for screening and prevention, each reviewed and up to date or declined. Immunizations reviewed and up to date or declined. Labs ordered and reviewed. Risk factors for depression reviewed and negative. Hearing function and visual acuity are intact. ADLs screened and addressed as needed. Functional ability and level of safety reviewed and appropriate. Education, counseling and referrals performed based on assessed risks today. Patient provided with a copy of personalized plan for preventive services.

## 2015-04-05 ENCOUNTER — Encounter: Payer: Medicare Other | Admitting: Internal Medicine

## 2015-05-04 ENCOUNTER — Other Ambulatory Visit: Payer: Self-pay | Admitting: Acute Care

## 2015-05-04 DIAGNOSIS — Z87891 Personal history of nicotine dependence: Secondary | ICD-10-CM

## 2015-05-12 ENCOUNTER — Telehealth: Payer: Self-pay | Admitting: Family

## 2015-05-12 DIAGNOSIS — R059 Cough, unspecified: Secondary | ICD-10-CM

## 2015-05-12 DIAGNOSIS — R05 Cough: Secondary | ICD-10-CM

## 2015-05-12 MED ORDER — BENZONATATE 100 MG PO CAPS: 100.0000 mg | ORAL_CAPSULE | Freq: Three times a day (TID) | ORAL | Status: DC | PRN

## 2015-05-12 NOTE — Progress Notes (Signed)
We are sorry that you are not feeling well.  Here is how we plan to help!  Based on what you have shared with me it looks like you have upper respiratory tract inflammation that has resulted in a significant cough.  Inflammation and infection in the upper respiratory tract is commonly called bronchitis and has four common causes:  Allergies, Viral Infections, Acid Reflux and Bacterial Infections.  Allergies, viruses and acid reflux are treated by controlling symptoms or eliminating the cause. An example might be a cough caused by taking certain blood pressure medications. You stop the cough by changing the medication. Another example might be a cough caused by acid reflux. Controlling the reflux helps control the cough.  Based on your presentation I believe you most likely have A cough due to a virus.  This is called viral bronchitis and is best treated by rest, plenty of fluids and control of the cough.  You may use Ibuprofen or Tylenol as directed to help your symptoms.    In addition you may use A non-prescription cough medication called Mucinex DM: take 2 tablets every 12 hours. and A prescription cough medication called Tessalon Perles 100mg. You may take 1-2 capsules every 8 hours as needed for your cough.    HOME CARE . Only take medications as instructed by your medical team. . Complete the entire course of an antibiotic. . Drink plenty of fluids and get plenty of rest. . Avoid close contacts especially the very young and the elderly . Cover your mouth if you cough or cough into your sleeve. . Always remember to wash your hands . A steam or ultrasonic humidifier can help congestion.    GET HELP RIGHT AWAY IF: . You develop worsening fever. . You become short of breath . You cough up blood. . Your symptoms persist after you have completed your treatment plan MAKE SURE YOU   Understand these instructions.  Will watch your condition.  Will get help right away if you are not doing  well or get worse.  Your e-visit answers were reviewed by a board certified advanced clinical practitioner to complete your personal care plan.  Depending on the condition, your plan could have included both over the counter or prescription medications. If there is a problem please reply  once you have received a response from your provider. Your safety is important to us.  If you have drug allergies check your prescription carefully.    You can use MyChart to ask questions about today's visit, request a non-urgent call back, or ask for a work or school excuse for 24 hours related to this e-Visit. If it has been greater than 24 hours you will need to follow up with your provider, or enter a new e-Visit to address those concerns. You will get an e-mail in the next two days asking about your experience.  I hope that your e-visit has been valuable and will speed your recovery. Thank you for using e-visits.   

## 2015-06-05 ENCOUNTER — Inpatient Hospital Stay: Admission: RE | Admit: 2015-06-05 | Payer: Medicare Other | Source: Ambulatory Visit

## 2015-06-05 ENCOUNTER — Encounter: Payer: Medicare Other | Admitting: Acute Care

## 2015-06-09 ENCOUNTER — Other Ambulatory Visit: Payer: Self-pay | Admitting: Internal Medicine

## 2015-06-13 ENCOUNTER — Encounter: Payer: Medicare Other | Admitting: Acute Care

## 2015-06-16 ENCOUNTER — Other Ambulatory Visit: Payer: Self-pay | Admitting: Acute Care

## 2015-06-16 DIAGNOSIS — Z87891 Personal history of nicotine dependence: Secondary | ICD-10-CM

## 2015-06-19 DIAGNOSIS — H35373 Puckering of macula, bilateral: Secondary | ICD-10-CM | POA: Diagnosis not present

## 2015-06-20 ENCOUNTER — Encounter: Payer: Self-pay | Admitting: Acute Care

## 2015-06-20 ENCOUNTER — Ambulatory Visit (INDEPENDENT_AMBULATORY_CARE_PROVIDER_SITE_OTHER): Payer: PPO | Admitting: Acute Care

## 2015-06-20 ENCOUNTER — Ambulatory Visit (INDEPENDENT_AMBULATORY_CARE_PROVIDER_SITE_OTHER)
Admission: RE | Admit: 2015-06-20 | Discharge: 2015-06-20 | Disposition: A | Discharge status: Home / Self Care | Payer: PPO | Source: Ambulatory Visit | Attending: Acute Care | Admitting: Acute Care

## 2015-06-20 ENCOUNTER — Telehealth: Payer: Self-pay | Admitting: Acute Care

## 2015-06-20 DIAGNOSIS — Z87891 Personal history of nicotine dependence: Secondary | ICD-10-CM

## 2015-06-20 NOTE — Telephone Encounter (Signed)
Dr. Alain Marion,  I have forwarded you this patient's scan. His scan was read as a lung rads 2, indicating nodules that are benign in appearance and behavior. Recommendation is to  repeat the scan in 12 months. I have called these results to the patient. Additional findings included extensive atherosclerosis, including left main and three-vessel coronary artery disease. Assessment for potential risk factor modification, dietary therapy or pharmacologic therapy may be warranted if you feel that is clinically indicated. Additionally, there are calcifications of the aortic valve, and emphysema noted per the exam. [name] told me he had been on a statin for strong family history but has recently stopped therapy for body aches. Please follow up with the patient as she feel is indicated.  Thank you again for the referral, and please let me know if you have any questions about this patient.  [name] as message 06/20/15.

## 2015-06-20 NOTE — Progress Notes (Signed)
Shared Decision Making Visit Lung Cancer Screening Program [id number])   Eligibility:  Age 70 y.o.  Pack Years Smoking History Calculation 60 pack years (# packs/per year x # years smoked)  Recent History of coughing up blood  No   Unexplained weight loss? no ( >Than 15 pounds within the last 6 months )  Prior History Lung / other cancer no (Diagnosis within the last 5 years already requiring surveillance chest CT Scans).  Smoking Status Former Smoker  Former Smokers: Years since quit: 13 years  Quit Date: 08/13/2001  Visit Components:  Discussion included one or more decision making aids. yes  Discussion included risk/benefits of screening. yes  Discussion included potential follow up diagnostic testing for abnormal scans. yes  Discussion included meaning and risk of over diagnosis. yes  Discussion included meaning and risk of False Positives. yes  Discussion included meaning of total radiation exposure. yes  Counseling Included:  Importance of adherence to annual lung cancer LDCT screening. yes  Impact of comorbidities on ability to participate in the program. yes  Ability and willingness to under diagnostic treatment. yes  Smoking Cessation Counseling:  Current Smokers:   Discussed importance of smoking cessation. NA; former smoker  Information about tobacco cessation classes and interventions provided to patient. yes  Patient provided with "ticket" for LDCT Scan. yes  Symptomatic Patient. no  Counseling:NA Former smoker  Diagnosis Code: Tobacco Use Z72.0  Asymptomatic Patient yes  Counseling  NA; former smoker  Former Smokers:   Discussed the importance of maintaining cigarette abstinence. yes  Diagnosis Code: Personal History of Nicotine Dependence. [id number]  Information about tobacco cessation classes and interventions provided to patient. Yes  Patient provided with "ticket" for LDCT Scan. yes  Written Order for Lung Cancer Screening with LDCT  placed in Epic. Yes (CT Chest Lung Cancer Screening Low Dose W/O CM) [id number] Z12.2-Screening of respiratory organs Z87.891-Personal history of nicotine dependence   I spent 15 minutes of face to face time with Mr. Gaal and his wife  discussing the risks and benefits of lung cancer screening. We viewed a power point together that explained in detail the above noted topics. We took the time to pause the power point at intervals to allow for questions to be asked and answered to ensure understanding. We discussed that he had taken the single most powerful action possible to decrease his risk of developing lung cancer when he quit smoking. I counseled him to remain smoke free, and to contact me if he ever had the desire to smoke again so that I can provide resources and tools to help support the effort to remain smoke free. We discussed the time and location of the scan, and that either [place] or I will call with the results within  24-48 hours of receiving them. Mr.Krack has my card and contact information in the event he needs to speak with me, in addition to a copy of the power point we reviewed as a resource. He verbalized understanding of all of the above and had no further questions upon leaving the office.   Magdalen Spatz, NP

## 2015-06-22 ENCOUNTER — Encounter: Payer: Self-pay | Admitting: Internal Medicine

## 2015-06-22 ENCOUNTER — Telehealth: Payer: Self-pay | Admitting: *Deleted

## 2015-06-22 ENCOUNTER — Other Ambulatory Visit: Payer: Self-pay | Admitting: *Deleted

## 2015-06-22 MED ORDER — AMLODIPINE BESYLATE 10 MG PO TABS: 10.0000 mg | ORAL_TABLET | Freq: Every day | ORAL | Status: DC

## 2015-06-22 MED ORDER — PIOGLITAZONE HCL 45 MG PO TABS: 45.0000 mg | ORAL_TABLET | ORAL | Status: DC

## 2015-06-22 NOTE — Telephone Encounter (Signed)
Receive call pt states he would like a 90 day on the actos. He can get 90 day cheaper. MD sent #30 on 1/17. Verified pharmacy inform [facility] will [facility].../lmb

## 2015-06-25 ENCOUNTER — Encounter: Payer: Self-pay | Admitting: Internal Medicine

## 2015-06-27 ENCOUNTER — Other Ambulatory Visit: Payer: Self-pay | Admitting: Internal Medicine

## 2015-06-27 DIAGNOSIS — I2510 Atherosclerotic heart disease of native coronary artery without angina pectoris: Secondary | ICD-10-CM

## 2015-06-29 ENCOUNTER — Other Ambulatory Visit: Payer: Self-pay | Admitting: *Deleted

## 2015-06-29 NOTE — Telephone Encounter (Signed)
OK to fill this prescription with additional refills x3 Thank you!  

## 2015-06-29 NOTE — Telephone Encounter (Signed)
Rf req for Alprazolam 0.5 mg 1-2 po qhs prn. Prefers 90 day supply. Ok to Rf?

## 2015-06-30 MED ORDER — ALPRAZOLAM 0.5 MG PO TABS: 0.5000 mg | ORAL_TABLET | Freq: Every evening | ORAL | Status: DC | PRN

## 2015-06-30 NOTE — Telephone Encounter (Signed)
[facility]ill call into [facility] had to leave on pharmacy vm...[name]

## 2015-07-11 ENCOUNTER — Encounter: Payer: Self-pay | Admitting: Internal Medicine

## 2015-07-28 ENCOUNTER — Encounter: Payer: Self-pay | Admitting: Cardiovascular Disease

## 2015-07-28 ENCOUNTER — Ambulatory Visit (INDEPENDENT_AMBULATORY_CARE_PROVIDER_SITE_OTHER): Payer: PPO | Admitting: Cardiovascular Disease

## 2015-07-28 VITALS — BP 130/80 | HR 65 | Ht 63.0 in | Wt 189.9 lb

## 2015-07-28 DIAGNOSIS — I2510 Atherosclerotic heart disease of native coronary artery without angina pectoris: Secondary | ICD-10-CM

## 2015-07-28 DIAGNOSIS — I10 Essential (primary) hypertension: Secondary | ICD-10-CM | POA: Diagnosis not present

## 2015-07-28 DIAGNOSIS — R011 Cardiac murmur, unspecified: Secondary | ICD-10-CM | POA: Diagnosis not present

## 2015-07-28 DIAGNOSIS — E119 Type 2 diabetes mellitus without complications: Secondary | ICD-10-CM

## 2015-07-28 DIAGNOSIS — E785 Hyperlipidemia, unspecified: Secondary | ICD-10-CM | POA: Diagnosis not present

## 2015-07-28 DIAGNOSIS — I2584 Coronary atherosclerosis due to calcified coronary lesion: Secondary | ICD-10-CM

## 2015-07-28 DIAGNOSIS — Z79899 Other long term (current) drug therapy: Secondary | ICD-10-CM

## 2015-07-28 MED ORDER — ROSUVASTATIN CALCIUM 10 MG PO TABS: 10.0000 mg | ORAL_TABLET | Freq: Every day | ORAL | Status: DC

## 2015-07-28 NOTE — Patient Instructions (Signed)
Your physician has requested that you have an echocardiogram. Echocardiography is a painless test that uses sound waves to create images of your heart. It provides your doctor with information about the size and shape of your heart and how well your heart's chambers and valves are working. This procedure takes approximately one hour. There are no restrictions for this procedure.  Your physician has requested that you have an exercise tolerance test. For further information please visit [email or web address]. Please also follow instruction sheet, as given.  Dr. Sallyanne Kuster recommends that you schedule a follow-up appointment in: 4-6 WEEKS

## 2015-07-28 NOTE — Progress Notes (Deleted)
Patient ID: [name], male   DOB: 02/08/1946, 70 y.o.   MRN: [id number] Patient ID: [name], male   DOB: Feb 07, 1946, 70 y.o.   MRN: [id number] graphic     Cardiology Office Note       Date:  07/28/2015    ID:  [name], [name] 1945-08-13, MRN [id number]   PCP:  Chase Kehr, MD         Cardiologist:   Chase Klein, MD     Chief Complaint   Patient presents with   .  New Patient (Initial Visit)       discuss abnormal chest CT findings.       History of Present Illness:    [name] is a 70 y.o. male with hypertension, type 2 diabetes mellitus and hyperlipidemia who presents to discuss cardiac workup after detection of calcification in his aortic valve and coronary arteries on a CT of the chest performed for lung cancer screening. He is a former smoker, but has quit for many years.   He is physically active. 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 He denies exertional dyspnea, intermittent claudication, leg edema, palpitations or recent syncope. He had a history of cough syncope many years ago. He has not had a formal stress test in over 10 years.   He has a strong family history of premature death from cardiac disease. His father died of a heart attack at age 40. His brother had a myocardial infarction in his 3s. He is retired from [employer].    He has excellent glycemic control with a recent hemoglobin A1c of 6.4%. He took atorvastatin 20 mg daily for many years without complications but about 18 months ago developed low back pain. The atorvastatin was discontinued at that time and his symptoms resolved, although it was never clear that he actually had a side effect from the medication.    Past Medical History   Diagnosis  Date   .  GERD (gastroesophageal reflux disease)     .  Hyperlipidemia     .  Hypertension     .  Chronic kidney disease         kidney stone and infection- started 03-27-11    .  Asthma         mild, chronic cough   .  Diabetes mellitus         Past Surgical History   Procedure  Laterality  Date   .  Appendectomy    1963   .  Cystoscopy w/ ureteral stent placement    03/26/11       removal of kidney stone       Outpatient Prescriptions Prior to Visit   Medication  Sig  Dispense  Refill   .  acetaminophen (TYLENOL) 650 MG CR tablet  Take 650 mg by mouth 2 (two) times daily as needed for pain.        [name]  ALPRAZolam (XANAX) 0.5 MG tablet  Take 1-2 tablets (0.5-1 mg total) by mouth at bedtime as needed for anxiety or sleep.  60 tablet  3   .  amLODipine (NORVASC) 10 MG tablet  Take 1 tablet (10 mg total) by mouth daily before breakfast.  90 tablet  3   .  aspirin EC 81 MG tablet  Take 81 mg by mouth daily.       .  carvedilol (COREG) 25 MG  tablet  TAKE 1 TABLET BY MOUTH TWICE A DAY WITH MEALS  180 tablet  2   .  Cholecalciferol (VITAMIN D3) 1000 UNITS tablet  Take 2,000 Units by mouth daily.        .  Coenzyme Q10 (CO Q-10) 200 MG CAPS  Take 2 capsules by mouth daily.       .  Folic Acid 0.8 MG CAPS  Take 2 capsules by mouth every morning.   30 each  0   .  MEGARED OMEGA-3 KRILL OIL 500 MG CAPS  Take 1 capsule by mouth every morning.  100 capsule  3   .  metFORMIN (GLUCOPHAGE XR) 750 MG 24 hr tablet  Take 1 tablet (750 mg total) by mouth daily with breakfast.  90 tablet  3   .  ONE TOUCH ULTRA TEST test strip  TEST TWICE A DAY AS DIRECTED  100 each  3   .  ONETOUCH DELICA LANCETS 99991111 MISC  USE AS DIRECTED  100 each  3   .  pioglitazone (ACTOS) 45 MG tablet  Take 22.5 mg by mouth every other day.       .  pioglitazone (ACTOS) 45 MG tablet  Take 1 tablet (45 mg total) by mouth every other day.  90 tablet  1   .  traMADol (ULTRAM) 50 MG tablet  Take 1-2 tablets (50-100 mg total) by mouth every 12 (twelve) hours as needed for moderate pain or severe pain.  180 tablet  1   .  traZODone (DESYREL) 50 MG tablet  Take 1-2 tablets (50-100 mg total) by mouth at bedtime as  needed for sleep.  60 tablet  5   .  Turmeric 500 MG CAPS  Take 1 capsule by mouth 2 (two) times daily.       .  benzonatate (TESSALON) 100 MG capsule  Take 1-2 capsules (100-200 mg total) by mouth every 8 (eight) hours as needed for cough.  20 capsule  0       No facility-administered medications prior to visit.      Allergies:   Olmesartan and Symbicort     Social History       Social History   .  Marital Status:  Married       Spouse Name:  N/A   .  Number of Children:  3   .  Years of Education:  N/A       Occupational History   .  Professional         Social History Main Topics   .  Smoking status:  Former Smoker -- 1.50 packs/day for 40 years       Types:  Cigarettes       Quit date:  08/13/2001   .  Smokeless tobacco:  Former Systems developer         Comment: Counseled to remain smoke free.   .  Alcohol Use:  6.0 oz/week       10 Glasses of wine per week   .  Drug Use:  No   .  Sexual Activity:  Yes       Other Topics  Concern   .  None       Social History Narrative     Regular Exercise -  YES; riding                Family History:  The patient's family history includes Coronary artery disease in his other; Diabetes in  his brother, father, and sister; Heart disease (age of onset: 69) in his father; Heart disease (age of onset: 32) in his sister; Hypertension in his brother, father, and other; Multiple sclerosis in his mother; Parkinsonism in his brother. There is no history of Colon cancer, Esophageal cancer, or Stomach cancer.    ROS:    Please see the history of present illness.     ROS All other systems reviewed and are negative.      PHYSICAL EXAM:     VS:  BP 130/80 mmHg  Pulse 65  Ht 5\' 3"  (1.6 m)  Wt 86.138 kg (189 lb 14.4 oz)  BMI 33.65 kg/m2    GEN: Well nourished, well developed, in no acute distress  HEENT: normal  Neck: no JVD, carotid bruits, or masses Cardiac: RRR; grade 1-2/6 early peaking systolic ejection murmur heard best at the left  upper sternal border, but also up and down the right sternal border, no diastolic murmurs, rubs, or gallops,no edema   Respiratory:  clear to auscultation bilaterally, normal work of breathing GI: soft, nontender, nondistended, + BS MS: no deformity or atrophy  Skin: warm and dry, no rash Neuro:  Alert and Oriented x 3, Strength and sensation are intact Psych: euthymic mood, full affect    Wt Readings from Last 3 Encounters:   07/28/15  86.138 kg (189 lb 14.4 oz)   04/04/15  82.101 kg (181 lb)   11/17/14  79.833 kg (176 lb)         Studies/Labs Reviewed:      EKG:  EKG is ordered today.  The ekg ordered today demonstrates normal sinus rhythm. QTC 424 ms, no repolarization abnormalities   Recent Labs: 03/28/2015: BUN 15; Creatinine, Ser 0.80; Potassium 4.4; Sodium 141    Lipid Panel  Labs (Brief)     Component  Value  Date/Time     CHOL  169  03/22/2014 0908     TRIG  54.0  03/22/2014 0908     HDL  81.00  03/22/2014 0908     CHOLHDL  2  03/22/2014 0908     VLDL  10.8  03/22/2014 0908     LDLCALC  77  03/22/2014 0908        Additional studies/ records that were reviewed today include:   Reviewed his chest CT images which show moderate calcification in the area of the aortic valve and fairly severe calcification the coronary arteries, especially prominent in the LAD artery        ASSESSMENT:        1.  Coronary artery calcification    2.  Heart murmur    3.  Essential hypertension    4.  Dyslipidemia    5.  Controlled type 2 diabetes mellitus without complication, without long-term current use of insulin ([place])    6.  Medication management          PLAN:    In order of problems listed above:    CT coronary calcification: He does not have any symptoms of coronary insufficiency. We'll schedule for a formal treadmill stress test. If this is normal, the focus should primarily be on risk factor modification Murmur: His physical exam is suggestive of aortic  valve sclerosis, but cannot exclude aortic stenosis. We'll schedule for an echocardiogram. There is no evidence to suggest severe aortic stenosis. HTN: Excellent blood pressure control HLP: I think he should be back on statins, both to arrest progression of coronary atherosclerosis, but also  aortic valve disease, whether or not these are hemodynamically important yet. Recommended rosuvastatin 10 mg once daily with a repeat cholesterol profile in about 3 months. One could make an argument to reduce LDL cholesterol to less than 70 mg/dL, in view of his rich risk factor profile and evidence of extensive atherosclerotic plaque DM: Excellent control of diabetes mellitus. Reviewed the fact that he has gained quite a bit of weight over the last few years and he needs to take steps to try to reverse that Continue coenzyme Q 10 when he starts the statin       Medication Adjustments/Labs and Tests Ordered: Current medicines are reviewed at length with the patient today.  Concerns regarding medicines are outlined above.  Medication changes, Labs and Tests ordered today are listed in the Patient Instructions below. Patient Instructions    Your physician has requested that you have an echocardiogram. Echocardiography is a painless test that uses sound waves to create images of your heart. It provides your doctor with information about the size and shape of your heart and how well your heart's chambers and valves are working. This procedure takes approximately one hour. There are no restrictions for this procedure.   Your physician has requested that you have an exercise tolerance test. For further information please visit [email or web address]. Please also follow instruction sheet, as given.   Dr. Sallyanne Kuster recommends that you schedule a follow-up appointment in: 4-6 WEEKS           Signed, Chase Laperle, MD   07/28/2015 12:56 PM     [address],  [place], [place]  [address] Phone: [phone]; Fax: [phone]

## 2015-07-28 NOTE — Progress Notes (Deleted)
Patient ID: [name], male   DOB: December 23, 1945, 70 y.o.   MRN: [id number]    Cardiology Office Note    Date:  07/28/2015   ID:  [name] August 28, 1945, MRN [id number]  PCP:  [name], MD  Cardiologist:   Sanda Klein, MD   Chief Complaint  Patient presents with  . New Patient (Initial Visit)    discuss abnormal chest CT findings.    History of Present Illness:  [name] is a 70 y.o. male with hypertension, type 2 diabetes mellitus and hyperlipidemia who presents to discuss cardiac workup after detection of calcification in his aortic valve and coronary arteries on a CT of the chest performed for lung cancer screening. He is a former smoker, but has quit for many years.  He is physically active. 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 He denies exertional dyspnea, intermittent claudication, leg edema, palpitations or recent syncope. He had a history of cough syncope many years ago. He has not had a formal stress test in over 10 years.  He has a strong family history of premature death from cardiac disease. His father died of a heart attack at age 56. His brother had a myocardial infarction in his 55s. He is retired from [employer].   He has excellent glycemic control with a recent hemoglobin A1c of 6.4%. He took atorvastatin 20 mg daily for many years without complications but about 18 months ago developed low back pain. The atorvastatin was discontinued at that time and his symptoms resolved, although it was never clear that he actually had a side effect from the medication.  Past Medical History  Diagnosis Date  . GERD (gastroesophageal reflux disease)   . Hyperlipidemia   . Hypertension   . Chronic kidney disease     kidney stone and infection- started 03-27-11  . Asthma     mild, chronic cough  . Diabetes mellitus     Past Surgical History  Procedure Laterality Date  . Appendectomy  1963  .  Cystoscopy w/ ureteral stent placement  03/26/11    removal of kidney stone    Outpatient Prescriptions Prior to Visit  Medication Sig Dispense Refill  . acetaminophen (TYLENOL) 650 MG CR tablet Take 650 mg by mouth 2 (two) times daily as needed for pain.     [name] ALPRAZolam (XANAX) 0.5 MG tablet Take 1-2 tablets (0.5-1 mg total) by mouth at bedtime as needed for anxiety or sleep. 60 tablet 3  . amLODipine (NORVASC) 10 MG tablet Take 1 tablet (10 mg total) by mouth daily before breakfast. 90 tablet 3  . aspirin EC 81 MG tablet Take 81 mg by mouth daily.    . carvedilol (COREG) 25 MG tablet TAKE 1 TABLET BY MOUTH TWICE A DAY WITH MEALS 180 tablet 2  . Cholecalciferol (VITAMIN D3) 1000 UNITS tablet Take 2,000 Units by mouth daily.     . Coenzyme Q10 (CO Q-10) 200 MG CAPS Take 2 capsules by mouth daily.    . Folic Acid 0.8 MG CAPS Take 2 capsules by mouth every morning.  30 each 0  . MEGARED OMEGA-3 KRILL OIL 500 MG CAPS Take 1 capsule by mouth every morning. 100 capsule 3  . metFORMIN (GLUCOPHAGE XR) 750 MG 24 hr tablet Take 1 tablet (750 mg total) by mouth daily with breakfast. 90 tablet 3  . ONE TOUCH ULTRA TEST test strip TEST TWICE A  DAY AS DIRECTED 100 each 3  . ONETOUCH DELICA LANCETS 99991111 MISC USE AS DIRECTED 100 each 3  . pioglitazone (ACTOS) 45 MG tablet Take 22.5 mg by mouth every other day.    . pioglitazone (ACTOS) 45 MG tablet Take 1 tablet (45 mg total) by mouth every other day. 90 tablet 1  . traMADol (ULTRAM) 50 MG tablet Take 1-2 tablets (50-100 mg total) by mouth every 12 (twelve) hours as needed for moderate pain or severe pain. 180 tablet 1  . traZODone (DESYREL) 50 MG tablet Take 1-2 tablets (50-100 mg total) by mouth at bedtime as needed for sleep. 60 tablet 5  . Turmeric 500 MG CAPS Take 1 capsule by mouth 2 (two) times daily.    . benzonatate (TESSALON) 100 MG capsule Take 1-2 capsules (100-200 mg total) by mouth every 8 (eight) hours as needed for cough. 20 capsule 0   No  facility-administered medications prior to visit.     Allergies:   Olmesartan and Symbicort   Social History   Social History  . Marital Status: Married    Spouse Name: N/A  . Number of Children: 3  . Years of Education: N/A   Occupational History  . Professional    Social History Main Topics  . Smoking status: Former Smoker -- 1.50 packs/day for 40 years    Types: Cigarettes    Quit date: 08/13/2001  . Smokeless tobacco: Former Systems developer     Comment: Counseled to remain smoke free.  . Alcohol Use: 6.0 oz/week    10 Glasses of wine per week  . Drug Use: No  . Sexual Activity: Yes   Other Topics Concern  . None   Social History Narrative   Regular Exercise -  YES; riding           Family History:  The patient's family history includes Coronary artery disease in his other; Diabetes in his brother, father, and sister; Heart disease (age of onset: 55) in his father; Heart disease (age of onset: 18) in his sister; Hypertension in his brother, father, and other; Multiple sclerosis in his mother; Parkinsonism in his brother. There is no history of Colon cancer, Esophageal cancer, or Stomach cancer.   ROS:   Please see the history of present illness.    ROS All other systems reviewed and are negative.   PHYSICAL EXAM:   VS:  BP 130/80 mmHg  Pulse 65  Ht 5\' 3"  (1.6 m)  Wt 86.138 kg (189 lb 14.4 oz)  BMI 33.65 kg/m2   GEN: Well nourished, well developed, in no acute distress HEENT: normal Neck: no JVD, carotid bruits, or masses Cardiac: RRR; grade 1-2/6 early peaking systolic ejection murmur heard best at the left upper sternal border, but also up and down the right sternal border, no diastolic murmurs, rubs, or gallops,no edema  Respiratory:  clear to auscultation bilaterally, normal work of breathing GI: soft, nontender, nondistended, + BS MS: no deformity or atrophy Skin: warm and dry, no rash Neuro:  Alert and Oriented x 3, Strength and sensation are intact Psych:  euthymic mood, full affect  Wt Readings from Last 3 Encounters:  07/28/15 86.138 kg (189 lb 14.4 oz)  04/04/15 82.101 kg (181 lb)  11/17/14 79.833 kg (176 lb)      Studies/Labs Reviewed:   EKG:  EKG is ordered today.  The ekg ordered today demonstrates normal sinus rhythm. QTC 424 ms, no repolarization abnormalities  Recent Labs: 03/28/2015: BUN 15; Creatinine, Ser 0.80;  Potassium 4.4; Sodium 141   Lipid Panel    Component Value Date/Time   CHOL 169 03/22/2014 0908   TRIG 54.0 03/22/2014 0908   HDL 81.00 03/22/2014 0908   CHOLHDL 2 03/22/2014 0908   VLDL 10.8 03/22/2014 0908   LDLCALC 77 03/22/2014 0908    Additional studies/ records that were reviewed today include:  Reviewed his chest CT images which show moderate calcification in the area of the aortic valve and fairly severe calcification the coronary arteries, especially prominent in the LAD artery    ASSESSMENT:    1. Coronary artery calcification   2. Heart murmur   3. Essential hypertension   4. Dyslipidemia   5. Controlled type 2 diabetes mellitus without complication, without long-term current use of insulin ([place])   6. Medication management      PLAN:  In order of problems listed above:  1. CT coronary calcification: He does not have any symptoms of coronary insufficiency. We'll schedule for a formal treadmill stress test. If this is normal, the focus should primarily be on risk factor modification 2. Murmur: His physical exam is suggestive of aortic valve sclerosis, but cannot exclude aortic stenosis. We'll schedule for an echocardiogram. There is no evidence to suggest severe aortic stenosis. 3. HTN: Excellent blood pressure control 4. HLP: I think he should be back on statins, both to arrest progression of coronary atherosclerosis, but also aortic valve disease, whether or not these are hemodynamically important yet. Recommended rosuvastatin 10 mg once daily with a repeat cholesterol profile in about 3  months. One could make an argument to reduce LDL cholesterol to less than 70 mg/dL, in view of his rich risk factor profile and evidence of extensive atherosclerotic plaque 5. DM: Excellent control of diabetes mellitus. Reviewed the fact that he has gained quite a bit of weight over the last few years and he needs to take steps to try to reverse that 6. Continue coenzyme Q 10 when he starts the statin    Medication Adjustments/Labs and Tests Ordered: Current medicines are reviewed at length with the patient today.  Concerns regarding medicines are outlined above.  Medication changes, Labs and Tests ordered today are listed in the Patient Instructions below. Patient Instructions  Your physician has requested that you have an echocardiogram. Echocardiography is a painless test that uses sound waves to create images of your heart. It provides your doctor with information about the size and shape of your heart and how well your heart's chambers and valves are working. This procedure takes approximately one hour. There are no restrictions for this procedure.  Your physician has requested that you have an exercise tolerance test. For further information please visit [email or web address]. Please also follow instruction sheet, as given.  Dr. Sallyanne Kuster recommends that you schedule a follow-up appointment in: 4-6 WEEKS        Signed, Dontarius Sheley, MD  07/28/2015 12:56 PM    [address], [place], [place]  [address] Phone: [phone]; Fax: [phone]

## 2015-07-28 NOTE — Progress Notes (Signed)
Cardiology Office Note    Date:  07/28/2015   ID:  Harrell, Chase 02-21-1946, MRN [id number]  PCP:  [name], MD  Cardiologist:   Sanda Klein, MD   Chief Complaint  Patient presents with  . New Patient (Initial Visit)    discuss abnormal chest CT findings.    History of Present Illness:  [name] is a 70 y.o. male physically active ex-smoker with hypertension, hyperlipidemia and diabetes mellitus who presents for evaluation after a screening chest CT showed evidence of calcification in the area of his aortic valve and coronary arteries.  He carries bales of hay for his horses and performs yard work and walks for exercise, without experiencing exertional angina, dyspnea or syncope. He had syncope a few years ago related to uncontrollable coughing. He has well-controlled diabetes mellitus with a recent hemoglobin A1c of 6.4%. He has taken atorvastatin 20 mg for many years without complications but this was stopped about 15 months ago due to complaints of low back pain. The back pain has since resolved.  He has a strong family history of premature coronary artery disease with his father dying at the age of 56 and his brother having a heart attack at age 90.    Past Medical History  Diagnosis Date  . GERD (gastroesophageal reflux disease)   . Hyperlipidemia   . Hypertension   . Chronic kidney disease     kidney stone and infection- started 03-27-11  . Asthma     mild, chronic cough  . Diabetes mellitus     Past Surgical History  Procedure Laterality Date  . Appendectomy  1963  . Cystoscopy w/ ureteral stent placement  03/26/11    removal of kidney stone    Outpatient Prescriptions Prior to Visit  Medication Sig Dispense Refill  . acetaminophen (TYLENOL) 650 MG CR tablet Take 650 mg by mouth 2 (two) times daily as needed for pain.     [name] ALPRAZolam (XANAX) 0.5 MG tablet Take 1-2 tablets (0.5-1 mg total) by mouth at bedtime as needed for anxiety or  sleep. 60 tablet 3  . amLODipine (NORVASC) 10 MG tablet Take 1 tablet (10 mg total) by mouth daily before breakfast. 90 tablet 3  . aspirin EC 81 MG tablet Take 81 mg by mouth daily.    . carvedilol (COREG) 25 MG tablet TAKE 1 TABLET BY MOUTH TWICE A DAY WITH MEALS 180 tablet 2  . Cholecalciferol (VITAMIN D3) 1000 UNITS tablet Take 2,000 Units by mouth daily.     . Coenzyme Q10 (CO Q-10) 200 MG CAPS Take 2 capsules by mouth daily.    . Folic Acid 0.8 MG CAPS Take 2 capsules by mouth every morning.  30 each 0  . MEGARED OMEGA-3 KRILL OIL 500 MG CAPS Take 1 capsule by mouth every morning. 100 capsule 3  . metFORMIN (GLUCOPHAGE XR) 750 MG 24 hr tablet Take 1 tablet (750 mg total) by mouth daily with breakfast. 90 tablet 3  . ONE TOUCH ULTRA TEST test strip TEST TWICE A DAY AS DIRECTED 100 each 3  . ONETOUCH DELICA LANCETS 99991111 MISC USE AS DIRECTED 100 each 3  . pioglitazone (ACTOS) 45 MG tablet Take 22.5 mg by mouth every other day.    . pioglitazone (ACTOS) 45 MG tablet Take 1 tablet (45 mg total) by mouth every other day. 90 tablet 1  . traMADol (ULTRAM) 50 MG tablet Take 1-2 tablets (50-100 mg total) by mouth every 12 (twelve) hours  as needed for moderate pain or severe pain. 180 tablet 1  . traZODone (DESYREL) 50 MG tablet Take 1-2 tablets (50-100 mg total) by mouth at bedtime as needed for sleep. 60 tablet 5  . Turmeric 500 MG CAPS Take 1 capsule by mouth 2 (two) times daily.    . benzonatate (TESSALON) 100 MG capsule Take 1-2 capsules (100-200 mg total) by mouth every 8 (eight) hours as needed for cough. 20 capsule 0   No facility-administered medications prior to visit.     Allergies:   Olmesartan and Symbicort   Social History   Social History  . Marital Status: Married    Spouse Name: N/A  . Number of Children: 3  . Years of Education: N/A   Occupational History  . Professional    Social History Main Topics  . Smoking status: Former Smoker -- 1.50 packs/day for 40 years     Types: Cigarettes    Quit date: 08/13/2001  . Smokeless tobacco: Former Systems developer     Comment: Counseled to remain smoke free.  . Alcohol Use: 6.0 oz/week    10 Glasses of wine per week  . Drug Use: No  . Sexual Activity: Yes   Other Topics Concern  . None   Social History Narrative   Regular Exercise -  YES; riding           Family History:  The patient's family history includes Coronary artery disease in his other; Diabetes in his brother, father, and sister; Heart disease (age of onset: 83) in his father; Heart disease (age of onset: 57) in his sister; Hypertension in his brother, father, and other; Multiple sclerosis in his mother; Parkinsonism in his brother. There is no history of Colon cancer, Esophageal cancer, or Stomach cancer.   ROS:   Please see the history of present illness.    ROS All other systems reviewed and are negative.   PHYSICAL EXAM:   VS:  BP 130/80 mmHg  Pulse 65  Ht 5\' 3"  (1.6 m)  Wt 86.138 kg (189 lb 14.4 oz)  BMI 33.65 kg/m2   GEN: Well nourished, well developed, in no acute distress HEENT: normal Neck: no JVD, carotid bruits, or masses Cardiac: RRR; 1-2/6 peaking aortic ejection murmur, no diastolic murmurs, rubs, or gallops,no edema  Respiratory:  clear to auscultation bilaterally, normal work of breathing GI: soft, nontender, nondistended, + BS MS: no deformity or atrophy Skin: warm and dry, no rash Neuro:  Alert and Oriented x 3, Strength and sensation are intact Psych: euthymic mood, full affect  Wt Readings from Last 3 Encounters:  07/28/15 86.138 kg (189 lb 14.4 oz)  04/04/15 82.101 kg (181 lb)  11/17/14 79.833 kg (176 lb)      Studies/Labs Reviewed:   EKG:  EKG is ordered today.  The ekg ordered today demonstrates sinus rhythm, normal tracing  Recent Labs: 03/28/2015: BUN 15; Creatinine, Ser 0.80; Potassium 4.4; Sodium 141   Lipid Panel (performed while on atorvastatin)    Component Value Date/Time   CHOL 169 03/22/2014 0908    TRIG 54.0 03/22/2014 0908   HDL 81.00 03/22/2014 0908   CHOLHDL 2 03/22/2014 0908   VLDL 10.8 03/22/2014 0908   LDLCALC 77 03/22/2014 0908    Additional studies/ records that were reviewed today include:  Chest CT images are reviewed. There is moderate calcification the aortic valve. There is fairly severe calcification in the coronaries, particularly in the proximal and mid left anterior descending artery distribution.  ASSESSMENT:    1. Coronary artery calcification   2. Heart murmur   3. Essential hypertension   4. Dyslipidemia   5. Controlled type 2 diabetes mellitus without complication, without long-term current use of insulin ([place])   6. Medication management      PLAN:  In order of problems listed above:  1. Calcification: Recommend treadmill stress test. Suspect this will be normal in which case I would recommend focusing on coronary risk factors, especially lowering his cholesterol 2. Murmur: He likely has aortic valve sclerosis, definitely does not sound like he has severe aortic stenosis. We'll check an echocardiogram 3. [id number] pressure is well controlled 4. HLP: I have recommended that he restart a statin. Just encased atorvastatin was responsible for his back pain (which is not clear) we'll try an alternative agent: Rosuvastatin 10 mg daily. Recheck labs in 3 months. In view of the extent of the coronary calcifications and his malignant family history of vascular complications and DM, would recommend a target LDL less than 70. 5. DM: Excellent diabetes control. Weight loss is recommended. 6. Meds: Continue coenzyme Q10 when he starts statin    Medication Adjustments/Labs and Tests Ordered: Current medicines are reviewed at length with the patient today.  Concerns regarding medicines are outlined above.  Medication changes, Labs and Tests ordered today are listed in the Patient Instructions below. Patient Instructions  Your physician has requested that you have  an echocardiogram. Echocardiography is a painless test that uses sound waves to create images of your heart. It provides your doctor with information about the size and shape of your heart and how well your heart's chambers and valves are working. This procedure takes approximately one hour. There are no restrictions for this procedure.  Your physician has requested that you have an exercise tolerance test. For further information please visit [email or web address]. Please also follow instruction sheet, as given.  Dr. Sallyanne Kuster recommends that you schedule a follow-up appointment in: [place], [place], MD  07/28/2015 1:34 PM    [facility] [place], [place], [place]  [address] Phone: [phone]; Fax: [phone]

## 2015-08-02 ENCOUNTER — Encounter: Payer: Self-pay | Admitting: Internal Medicine

## 2015-08-02 ENCOUNTER — Other Ambulatory Visit (INDEPENDENT_AMBULATORY_CARE_PROVIDER_SITE_OTHER): Payer: PPO

## 2015-08-02 DIAGNOSIS — Z0000 Encounter for general adult medical examination without abnormal findings: Secondary | ICD-10-CM | POA: Diagnosis not present

## 2015-08-02 DIAGNOSIS — I10 Essential (primary) hypertension: Secondary | ICD-10-CM | POA: Diagnosis not present

## 2015-08-02 DIAGNOSIS — E119 Type 2 diabetes mellitus without complications: Secondary | ICD-10-CM | POA: Diagnosis not present

## 2015-08-02 DIAGNOSIS — H43823 Vitreomacular adhesion, bilateral: Secondary | ICD-10-CM | POA: Diagnosis not present

## 2015-08-02 DIAGNOSIS — N320 Bladder-neck obstruction: Secondary | ICD-10-CM | POA: Diagnosis not present

## 2015-08-02 LAB — URINALYSIS
Bilirubin Urine: NEGATIVE
Hgb urine dipstick: NEGATIVE
Ketones, ur: NEGATIVE
LEUKOCYTES UA: NEGATIVE
NITRITE: NEGATIVE
SPECIFIC GRAVITY, URINE: 1.02 (ref 1.000–1.030)
Total Protein, Urine: NEGATIVE
URINE GLUCOSE: NEGATIVE
UROBILINOGEN UA: 0.2 (ref 0.0–1.0)
pH: 6 (ref 5.0–8.0)

## 2015-08-02 LAB — LIPID PANEL
CHOL/HDL RATIO: 2
Cholesterol: 212 mg/dL — ABNORMAL HIGH (ref 0–200)
HDL: 84.9 mg/dL (ref >39.00)
LDL Cholesterol: 106 mg/dL — ABNORMAL HIGH (ref 0–99)
NonHDL: 126.74
TRIGLYCERIDES: 106 mg/dL (ref 0.0–149.0)
VLDL: 21.2 mg/dL (ref 0.0–40.0)

## 2015-08-02 LAB — MICROALBUMIN / CREATININE URINE RATIO
CREATININE, U: 132.6 mg/dL
Microalb Creat Ratio: 1.1 mg/g (ref 0.0–30.0)
Microalb, Ur: 1.5 mg/dL (ref 0.0–1.9)

## 2015-08-02 LAB — BASIC METABOLIC PANEL
BUN: 18 mg/dL (ref 6–23)
CALCIUM: 9.3 mg/dL (ref 8.4–10.5)
CO2: 27 meq/L (ref 19–32)
Chloride: 103 mEq/L (ref 96–112)
Creatinine, Ser: 0.85 mg/dL (ref 0.40–1.50)
GFR: 94.38 mL/min (ref >60.00)
GLUCOSE: 130 mg/dL — AB (ref 70–99)
Potassium: 4.2 mEq/L (ref 3.5–5.1)
Sodium: 138 mEq/L (ref 135–145)

## 2015-08-02 LAB — CBC WITH DIFFERENTIAL/PLATELET
BASOS PCT: 0.8 % (ref 0.0–3.0)
Basophils Absolute: 0.1 10*3/uL (ref 0.0–0.1)
EOS PCT: 5.9 % — AB (ref 0.0–5.0)
Eosinophils Absolute: 0.4 10*3/uL (ref 0.0–0.7)
HEMATOCRIT: 44.5 % (ref 39.0–52.0)
HEMOGLOBIN: 15.8 g/dL (ref 13.0–17.0)
LYMPHS PCT: 31.5 % (ref 12.0–46.0)
Lymphs Abs: 2 10*3/uL (ref 0.7–4.0)
MCHC: 35.5 g/dL (ref 30.0–36.0)
MCV: 92.5 fl (ref 78.0–100.0)
MONOS PCT: 11.8 % (ref 3.0–12.0)
Monocytes Absolute: 0.8 10*3/uL (ref 0.1–1.0)
Neutro Abs: 3.2 10*3/uL (ref 1.4–7.7)
Neutrophils Relative %: 50 % (ref 43.0–77.0)
Platelets: 212 10*3/uL (ref 150.0–400.0)
RBC: 4.82 Mil/uL (ref 4.22–5.81)
RDW: 13.9 % (ref 11.5–15.5)
WBC: 6.4 10*3/uL (ref 4.0–10.5)

## 2015-08-02 LAB — HEPATIC FUNCTION PANEL
ALT: 21 U/L (ref 0–53)
AST: 20 U/L (ref 0–37)
Albumin: 4.2 g/dL (ref 3.5–5.2)
Alkaline Phosphatase: 68 U/L (ref 39–117)
BILIRUBIN DIRECT: 0.1 mg/dL (ref 0.0–0.3)
BILIRUBIN TOTAL: 0.6 mg/dL (ref 0.2–1.2)
TOTAL PROTEIN: 6.5 g/dL (ref 6.0–8.3)

## 2015-08-02 LAB — PSA: PSA: 1.13 ng/mL (ref 0.10–4.00)

## 2015-08-02 LAB — TSH: TSH: 1.69 u[IU]/mL (ref 0.35–4.50)

## 2015-08-02 LAB — HEMOGLOBIN A1C: Hgb A1c MFr Bld: 6.5 % (ref 4.6–6.5)

## 2015-08-09 ENCOUNTER — Encounter: Payer: Self-pay | Admitting: Internal Medicine

## 2015-08-09 ENCOUNTER — Ambulatory Visit (INDEPENDENT_AMBULATORY_CARE_PROVIDER_SITE_OTHER): Payer: PPO | Admitting: Internal Medicine

## 2015-08-09 VITALS — BP 108/70 | HR 56 | Wt 189.0 lb

## 2015-08-09 DIAGNOSIS — E119 Type 2 diabetes mellitus without complications: Secondary | ICD-10-CM | POA: Diagnosis not present

## 2015-08-09 DIAGNOSIS — I2510 Atherosclerotic heart disease of native coronary artery without angina pectoris: Secondary | ICD-10-CM

## 2015-08-09 DIAGNOSIS — E785 Hyperlipidemia, unspecified: Secondary | ICD-10-CM

## 2015-08-09 DIAGNOSIS — I10 Essential (primary) hypertension: Secondary | ICD-10-CM | POA: Diagnosis not present

## 2015-08-09 DIAGNOSIS — K219 Gastro-esophageal reflux disease without esophagitis: Secondary | ICD-10-CM

## 2015-08-09 MED ORDER — ALPRAZOLAM 0.5 MG PO TABS: 0.5000 mg | ORAL_TABLET | Freq: Every evening | ORAL | Status: DC | PRN

## 2015-08-09 NOTE — Assessment & Plan Note (Signed)
Doing well Nexium prn

## 2015-08-09 NOTE — Progress Notes (Signed)
Subjective:  Patient ID: [name], male    DOB: Oct 07, 1945  Age: 70 y.o. MRN: [id number]  CC: No chief complaint on file.   HPI [name] presents for DM, CAD, HTN f/u  Outpatient Prescriptions Prior to Visit  Medication Sig Dispense Refill  . acetaminophen (TYLENOL) 650 MG CR tablet Take 650 mg by mouth 2 (two) times daily as needed for pain.     [name] amLODipine (NORVASC) 10 MG tablet Take 1 tablet (10 mg total) by mouth daily before breakfast. 90 tablet 3  . aspirin EC 81 MG tablet Take 81 mg by mouth daily.    . carvedilol (COREG) 25 MG tablet TAKE 1 TABLET BY MOUTH TWICE A DAY WITH MEALS 180 tablet 2  . Cholecalciferol (VITAMIN D3) 1000 UNITS tablet Take 2,000 Units by mouth daily.     . Coenzyme Q10 (CO Q-10) 200 MG CAPS Take 2 capsules by mouth daily.    . Folic Acid 0.8 MG CAPS Take 2 capsules by mouth every morning.  30 each 0  . MEGARED OMEGA-3 KRILL OIL 500 MG CAPS Take 1 capsule by mouth every morning. 100 capsule 3  . metFORMIN (GLUCOPHAGE XR) 750 MG 24 hr tablet Take 1 tablet (750 mg total) by mouth daily with breakfast. 90 tablet 3  . ONE TOUCH ULTRA TEST test strip TEST TWICE A DAY AS DIRECTED 100 each 3  . ONETOUCH DELICA LANCETS 99991111 MISC USE AS DIRECTED 100 each 3  . rosuvastatin (CRESTOR) 10 MG tablet Take 1 tablet (10 mg total) by mouth daily. 90 tablet 3  . traMADol (ULTRAM) 50 MG tablet Take 1-2 tablets (50-100 mg total) by mouth every 12 (twelve) hours as needed for moderate pain or severe pain. 180 tablet 1  . traZODone (DESYREL) 50 MG tablet Take 1-2 tablets (50-100 mg total) by mouth at bedtime as needed for sleep. 60 tablet 5  . Turmeric 500 MG CAPS Take 1 capsule by mouth 2 (two) times daily.    [name] ALPRAZolam (XANAX) 0.5 MG tablet Take 1-2 tablets (0.5-1 mg total) by mouth at bedtime as needed for anxiety or sleep. 60 tablet 3  . pioglitazone (ACTOS) 45 MG tablet Take 22.5 mg by mouth every other day.    . pioglitazone (ACTOS) 45 MG tablet Take 1  tablet (45 mg total) by mouth every other day. (Patient not taking: Reported on 08/09/2015) 90 tablet 1   No facility-administered medications prior to visit.    ROS Review of Systems  Constitutional: Negative for appetite change, fatigue and unexpected weight change.  HENT: Negative for congestion, nosebleeds, sneezing, sore throat and trouble swallowing.   Eyes: Negative for itching and visual disturbance.  Respiratory: Negative for cough.   Cardiovascular: Negative for chest pain, palpitations and leg swelling.  Gastrointestinal: Negative for nausea, diarrhea, blood in stool and abdominal distention.  Genitourinary: Negative for frequency and hematuria.  Musculoskeletal: Negative for back pain, joint swelling, gait problem and neck pain.  Skin: Negative for rash.  Neurological: Negative for dizziness, tremors, speech difficulty and weakness.  Psychiatric/Behavioral: Negative for sleep disturbance, dysphoric mood and agitation. The patient is not nervous/anxious.     Objective:  BP 108/70 mmHg  Pulse 56  Wt 189 lb (85.73 kg)  SpO2 93%  BP Readings from Last 3 Encounters:  08/09/15 108/70  07/28/15 130/80  04/04/15 130/88    Wt Readings from Last 3 Encounters:  08/09/15 189 lb (85.73 kg)  07/28/15 189 lb 14.4 oz (86.138  kg)  04/04/15 181 lb (82.101 kg)    Physical Exam  Constitutional: He is oriented to person, place, and time. He appears well-developed. No distress.  NAD  HENT:  Mouth/Throat: Oropharynx is clear and moist.  Eyes: Conjunctivae are normal. Pupils are equal, round, and reactive to light.  Neck: Normal range of motion. No JVD present. No thyromegaly present.  Cardiovascular: Normal rate, regular rhythm, normal heart sounds and intact distal pulses.  Exam reveals no gallop and no friction rub.   No murmur heard. Pulmonary/Chest: Effort normal and breath sounds normal. No respiratory distress. He has no wheezes. He has no rales. He exhibits no tenderness.    Abdominal: Soft. Bowel sounds are normal. He exhibits no distension and no mass. There is no tenderness. There is no rebound and no guarding.  Musculoskeletal: Normal range of motion. He exhibits no edema or tenderness.  Lymphadenopathy:    He has no cervical adenopathy.  Neurological: He is alert and oriented to person, place, and time. He has normal reflexes. No cranial nerve deficit. He exhibits normal muscle tone. He displays a negative Romberg sign. Coordination and gait normal.  Skin: Skin is warm and dry. No rash noted.  Psychiatric: He has a normal mood and affect. His behavior is normal. Judgment and thought content normal.    Lab Results  Component Value Date   WBC 6.4 08/02/2015   HGB 15.8 08/02/2015   HCT 44.5 08/02/2015   PLT 212.0 08/02/2015   GLUCOSE 130* 08/02/2015   CHOL 212* 08/02/2015   TRIG 106.0 08/02/2015   HDL 84.90 08/02/2015   LDLCALC 106* 08/02/2015   ALT 21 08/02/2015   AST 20 08/02/2015   NA 138 08/02/2015   K 4.2 08/02/2015   CL 103 08/02/2015   CREATININE 0.85 08/02/2015   BUN 18 08/02/2015   CO2 27 08/02/2015   TSH 1.69 08/02/2015   PSA 1.13 08/02/2015   HGBA1C 6.5 08/02/2015   MICROALBUR 1.5 08/02/2015    Ct Chest Lung Ca Screen Low Dose W/o Cm  06/20/2015  CLINICAL DATA:  70 year old male former smoker (Quit in 2003) with 60 pack-year history of smoking. Lung cancer screening examination. EXAM: CT CHEST WITHOUT CONTRAST LOW-DOSE FOR LUNG CANCER SCREENING TECHNIQUE: Multidetector CT imaging of the chest was performed following the standard protocol without IV contrast. COMPARISON:  No priors. FINDINGS: Mediastinum/Lymph Nodes: Heart size is normal. There is no significant pericardial fluid, thickening or pericardial calcification. There is atherosclerosis of the thoracic aorta, the great vessels of the mediastinum and the coronary arteries, including calcified atherosclerotic plaque in the left main, left anterior descending, left circumflex and  right coronary arteries. Calcifications of the aortic valve. No pathologically enlarged mediastinal or hilar lymph nodes. Please note that accurate exclusion of hilar adenopathy is limited on noncontrast CT scans. Esophagus is unremarkable in appearance. No axillary lymphadenopathy. Lungs/Pleura: Small pulmonary nodules in the lungs measuring up to a volume derived mean diameter of 3 mm in the periphery of the left lower lobe (image 166 of series 3). No larger more suspicious appearing pulmonary nodules or masses are otherwise noted. No acute consolidative airspace disease. No pleural effusions. Mild cylindrical bronchiectasis and architectural distortion in the anterior aspect of the left upper lobe, presumably a region of post infectious or inflammatory scarring. Mild diffuse bronchial wall thickening with mild centrilobular and paraseptal emphysema. Upper Abdomen: Unremarkable. Musculoskeletal/Soft Tissues: There are no aggressive appearing lytic or blastic lesions noted in the visualized portions of the skeleton. IMPRESSION: 1.  Lung-RADS Category 2S, benign appearance or behavior. Continue annual screening with low-dose chest CT without contrast in 12 months. 2. The "S" modifier above refers to potentially clinically significant non lung cancer related findings. Specifically, there is extensive atherosclerosis, including left main and 3 vessel coronary artery disease. Assessment for potential risk factor modification, dietary therapy or pharmacologic therapy may be warranted, if clinically indicated. 3. There are calcifications of the aortic valve. Echocardiographic correlation for evaluation of potential valvular dysfunction may be warranted if clinically indicated. 4. Diffuse bronchial wall thickening with mild centrilobular and paraseptal emphysema; imaging findings suggestive of underlying COPD. Electronically Signed   By: Vinnie Langton M.D.   On: 06/20/2015 13:45    Assessment & Plan:   Diagnoses  and all orders for this visit:  Essential hypertension  Coronary artery calcification seen on CT scan  Controlled type 2 diabetes mellitus without complication, without long-term current use of insulin ([facility])  Dyslipidemia  Gastroesophageal reflux disease without esophagitis  Other orders -     ALPRAZolam (XANAX) 0.5 MG tablet; Take 1-2 tablets (0.5-1 mg total) by mouth at bedtime as needed for anxiety or sleep.  I am having Mr. Sharman maintain his cholecalciferol, Folic Acid, pioglitazone, ONETOUCH DELICA LANCETS 99991111, ONE TOUCH ULTRA TEST, traZODone, traMADol, Co Q-10, acetaminophen, Turmeric, MEGARED OMEGA-3 KRILL OIL, metFORMIN, carvedilol, aspirin EC, amLODipine, rosuvastatin, and ALPRAZolam.  Meds ordered this encounter  Medications  . ALPRAZolam (XANAX) 0.5 MG tablet    Sig: Take 1-2 tablets (0.5-1 mg total) by mouth at bedtime as needed for anxiety or sleep.    Dispense:  180 tablet    Refill:  1     Follow-up: Return in about 4 months (around 12/09/2015) for a follow-up visit.  [name], MD

## 2015-08-09 NOTE — Assessment & Plan Note (Signed)
On Crestor 

## 2015-08-09 NOTE — Assessment & Plan Note (Signed)
Stress test is pending

## 2015-08-09 NOTE — Progress Notes (Signed)
Pre visit review using our clinic review tool, if applicable. No additional management support is needed unless otherwise documented below in the visit note. 

## 2015-08-09 NOTE — Assessment & Plan Note (Signed)
Metfomin XR, cont Actos Lab Results  Component Value Date   HGBA1C 6.5 08/02/2015

## 2015-08-09 NOTE — Assessment & Plan Note (Signed)
On Coreg, Amlodipine

## 2015-08-15 ENCOUNTER — Telehealth: Payer: Self-pay

## 2015-08-15 ENCOUNTER — Telehealth (HOSPITAL_COMMUNITY): Payer: Self-pay

## 2015-08-15 NOTE — Telephone Encounter (Signed)
PT CALLED INQUIRING ABOUT PRECERT FOR HIS UP COMING GXT  . HE WAS TOLD BY INSURANCE COMPANY THAT PRECERT WAS REQUIRED .WILL FOLLOW UP ON THIS  MATTER WITH PRECERT TEAM AND GIVE PATIENT AN UPDATE  AS SOON AS POSSIBLE . PATIENT  CONTACT NUMBER IS [phone]

## 2015-08-15 NOTE — Telephone Encounter (Signed)
I SPOKE WITH PATIENT AND HE I AWARE THAT  A GXT DOES NOT REQUIRE PRECERT WITH HIS INSURANCE PER [name] .

## 2015-08-15 NOTE — Telephone Encounter (Signed)
Encounter complete. 

## 2015-08-17 ENCOUNTER — Ambulatory Visit (HOSPITAL_BASED_OUTPATIENT_CLINIC_OR_DEPARTMENT_OTHER)
Admission: RE | Admit: 2015-08-17 | Discharge: 2015-08-17 | Disposition: A | Discharge status: Home / Self Care | Payer: PPO | Source: Ambulatory Visit | Attending: Cardiology | Admitting: Cardiology

## 2015-08-17 ENCOUNTER — Ambulatory Visit (HOSPITAL_COMMUNITY)
Admission: RE | Admit: 2015-08-17 | Discharge: 2015-08-17 | Disposition: A | Discharge status: Home / Self Care | Payer: PPO | Source: Ambulatory Visit | Attending: Cardiology | Admitting: Cardiology

## 2015-08-17 DIAGNOSIS — E119 Type 2 diabetes mellitus without complications: Secondary | ICD-10-CM | POA: Insufficient documentation

## 2015-08-17 DIAGNOSIS — R011 Cardiac murmur, unspecified: Secondary | ICD-10-CM | POA: Insufficient documentation

## 2015-08-17 DIAGNOSIS — I119 Hypertensive heart disease without heart failure: Secondary | ICD-10-CM | POA: Insufficient documentation

## 2015-08-17 DIAGNOSIS — Z87891 Personal history of nicotine dependence: Secondary | ICD-10-CM | POA: Diagnosis not present

## 2015-08-17 DIAGNOSIS — I2584 Coronary atherosclerosis due to calcified coronary lesion: Secondary | ICD-10-CM

## 2015-08-17 DIAGNOSIS — I2510 Atherosclerotic heart disease of native coronary artery without angina pectoris: Secondary | ICD-10-CM

## 2015-08-17 DIAGNOSIS — I358 Other nonrheumatic aortic valve disorders: Secondary | ICD-10-CM | POA: Diagnosis not present

## 2015-08-17 DIAGNOSIS — E785 Hyperlipidemia, unspecified: Secondary | ICD-10-CM | POA: Insufficient documentation

## 2015-08-17 DIAGNOSIS — I071 Rheumatic tricuspid insufficiency: Secondary | ICD-10-CM | POA: Diagnosis not present

## 2015-08-17 LAB — EXERCISE TOLERANCE TEST
CSEPEW: 10.2 METS
CSEPHR: 73 %
CSEPPHR: 109 {beats}/min
Exercise duration (min): 9 min
Exercise duration (sec): 7 s
MPHR: 149 {beats}/min
RPE: 17
Rest HR: 56 {beats}/min

## 2015-08-18 ENCOUNTER — Telehealth: Payer: Self-pay | Admitting: *Deleted

## 2015-08-18 DIAGNOSIS — I2510 Atherosclerotic heart disease of native coronary artery without angina pectoris: Secondary | ICD-10-CM

## 2015-08-18 DIAGNOSIS — I2584 Coronary atherosclerosis due to calcified coronary lesion: Principal | ICD-10-CM

## 2015-08-18 DIAGNOSIS — R6889 Other general symptoms and signs: Secondary | ICD-10-CM

## 2015-08-18 NOTE — Telephone Encounter (Signed)
-----   Message from Sanda Klein, MD sent at 08/18/2015  1:03 PM EDT ----- Echo looks good - no serious abnormalities. Unfortunately, treadmill test is not helpful - insufficient heart rate increase. Would recommend Lexiscan Myoview instead.

## 2015-08-18 NOTE — Telephone Encounter (Addendum)
Spoke to patient and wife. Result given . Verbalized understanding  Need schedule lexiscan myoview Order placed.

## 2015-08-25 ENCOUNTER — Ambulatory Visit: Payer: PPO | Admitting: Cardiovascular Disease

## 2015-08-25 ENCOUNTER — Telehealth (HOSPITAL_COMMUNITY): Payer: Self-pay

## 2015-08-25 NOTE — Telephone Encounter (Signed)
Encounter complete. 

## 2015-08-28 ENCOUNTER — Telehealth: Payer: Self-pay | Admitting: Cardiovascular Disease

## 2015-08-28 NOTE — Telephone Encounter (Signed)
New Message  Pt states that he is retuning the call concerning the up coming Myoview/

## 2015-08-30 ENCOUNTER — Ambulatory Visit: Payer: PPO | Admitting: Cardiovascular Disease

## 2015-08-30 ENCOUNTER — Ambulatory Visit (HOSPITAL_COMMUNITY)
Admission: RE | Admit: 2015-08-30 | Discharge: 2015-08-30 | Disposition: A | Discharge status: Home / Self Care | Payer: PPO | Source: Ambulatory Visit | Attending: Cardiovascular Disease | Admitting: Cardiovascular Disease

## 2015-08-30 DIAGNOSIS — R6889 Other general symptoms and signs: Secondary | ICD-10-CM | POA: Insufficient documentation

## 2015-08-30 DIAGNOSIS — I10 Essential (primary) hypertension: Secondary | ICD-10-CM | POA: Insufficient documentation

## 2015-08-30 DIAGNOSIS — E663 Overweight: Secondary | ICD-10-CM | POA: Diagnosis not present

## 2015-08-30 DIAGNOSIS — R9439 Abnormal result of other cardiovascular function study: Secondary | ICD-10-CM | POA: Insufficient documentation

## 2015-08-30 DIAGNOSIS — I2510 Atherosclerotic heart disease of native coronary artery without angina pectoris: Secondary | ICD-10-CM

## 2015-08-30 DIAGNOSIS — E119 Type 2 diabetes mellitus without complications: Secondary | ICD-10-CM | POA: Insufficient documentation

## 2015-08-30 DIAGNOSIS — R0609 Other forms of dyspnea: Secondary | ICD-10-CM | POA: Diagnosis not present

## 2015-08-30 DIAGNOSIS — Z8249 Family history of ischemic heart disease and other diseases of the circulatory system: Secondary | ICD-10-CM | POA: Diagnosis not present

## 2015-08-30 DIAGNOSIS — I2584 Coronary atherosclerosis due to calcified coronary lesion: Secondary | ICD-10-CM | POA: Diagnosis not present

## 2015-08-30 DIAGNOSIS — R5383 Other fatigue: Secondary | ICD-10-CM | POA: Insufficient documentation

## 2015-08-30 DIAGNOSIS — Z87891 Personal history of nicotine dependence: Secondary | ICD-10-CM | POA: Insufficient documentation

## 2015-08-30 DIAGNOSIS — Z6833 Body mass index (BMI) 33.0-33.9, adult: Secondary | ICD-10-CM | POA: Insufficient documentation

## 2015-08-30 LAB — MYOCARDIAL PERFUSION IMAGING
CHL CUP NUCLEAR SRS: 4
CHL CUP NUCLEAR SSS: 4
CSEPPHR: 63 {beats}/min
LVDIAVOL: 87 mL (ref 62–150)
LVSYSVOL: 40 mL
NUC STRESS TID: 1.14
Rest HR: 54 {beats}/min
SDS: 0

## 2015-08-30 MED ORDER — TECHNETIUM TC 99M SESTAMIBI GENERIC - CARDIOLITE
11.0000 | Freq: Once | INTRAVENOUS | Status: AC | PRN
  Administered 2015-08-30: 11 via INTRAVENOUS

## 2015-08-30 MED ORDER — TECHNETIUM TC 99M SESTAMIBI GENERIC - CARDIOLITE
30.6000 | Freq: Once | INTRAVENOUS | Status: AC | PRN
  Administered 2015-08-30: 30.6 via INTRAVENOUS

## 2015-08-30 MED ORDER — REGADENOSON 0.4 MG/5ML IV SOLN
0.4000 mg | Freq: Once | INTRAVENOUS | Status: AC
  Administered 2015-08-30: 0.4 mg via INTRAVENOUS

## 2015-09-18 ENCOUNTER — Encounter: Payer: Self-pay | Admitting: Cardiovascular Disease

## 2015-09-18 ENCOUNTER — Ambulatory Visit (INDEPENDENT_AMBULATORY_CARE_PROVIDER_SITE_OTHER): Payer: PPO | Admitting: Cardiovascular Disease

## 2015-09-18 VITALS — BP 116/74 | HR 59 | Ht 64.0 in | Wt 189.0 lb

## 2015-09-18 DIAGNOSIS — I2510 Atherosclerotic heart disease of native coronary artery without angina pectoris: Secondary | ICD-10-CM

## 2015-09-18 DIAGNOSIS — E119 Type 2 diabetes mellitus without complications: Secondary | ICD-10-CM | POA: Diagnosis not present

## 2015-09-18 DIAGNOSIS — E785 Hyperlipidemia, unspecified: Secondary | ICD-10-CM

## 2015-09-18 DIAGNOSIS — I10 Essential (primary) hypertension: Secondary | ICD-10-CM | POA: Diagnosis not present

## 2015-09-18 MED ORDER — ROSUVASTATIN CALCIUM 10 MG PO TABS: 10.0000 mg | ORAL_TABLET | ORAL | Status: DC

## 2015-09-18 NOTE — Progress Notes (Signed)
Patient ID: [name]., male   DOB: 1945/11/18, 70 y.o.   MRN: [id number]    Cardiology Office Note    Date:  09/18/2015   ID:  [name]., DOB 1945/11/12, MRN [id number]  PCP:  [name], MD  Cardiologist:   Sanda Klein, MD   Chief Complaint  Patient presents with  . 4 WEEKS    FOLLOW UP AFTER TESTS. NO COMPLAINTS.    History of Present Illness:  [name]. is a 70 y.o. male with cardiac murmur and incidentally discovered coronary artery calcifications on CT of the chest. He has multiple coronary risk factors that include type 2 diabetes mellitus, hyperlipidemia and hypertension. He returns for follow-up on multiple noninvasive studies. His echocardiogram shows aortic valve sclerosis without significant stenosis. His treadmill stress test was nondiagnostic due to inadequate heart rate and a follow-up Lexiscan Myoview showed inferior diaphragmatic attenuation artifact. Ejection fraction was normal on the echocardiogram and calculated borderline at 54% on the nuclear scintigram. Both studies showed normal regional wall motion. He continues to be physically very active and denies any problems with exertional angina or dyspnea or any other cardiovascular complaints. He has a remote history of cough syncope. His diabetes, hypertension, cholesterol levels are all very well controlled with diet/exercise and pharmaceuticals. He has a strong family history of premature coronary disease leading to death. Not long after starting treatment with rosuvastatin he began experiencing back muscle aches, very similar to the symptoms that he had when he took Lipitor. These have not yet become severe, but he recalls being completely disabled by back pain that resolved as soon as he discontinued Lipitor in the past.    Past Medical History  Diagnosis Date  . GERD (gastroesophageal reflux disease)   . Hyperlipidemia   . Hypertension   . Chronic kidney disease     kidney  stone and infection- started 03-27-11  . Asthma     mild, chronic cough  . Diabetes mellitus     Past Surgical History  Procedure Laterality Date  . Appendectomy  1963  . Cystoscopy w/ ureteral stent placement  03/26/11    removal of kidney stone    Current Medications: Outpatient Prescriptions Prior to Visit  Medication Sig Dispense Refill  . acetaminophen (TYLENOL) 650 MG CR tablet Take 650 mg by mouth 2 (two) times daily as needed for pain.     [name] ALPRAZolam (XANAX) 0.5 MG tablet Take 1-2 tablets (0.5-1 mg total) by mouth at bedtime as needed for anxiety or sleep. 180 tablet 1  . amLODipine (NORVASC) 10 MG tablet Take 1 tablet (10 mg total) by mouth daily before breakfast. 90 tablet 3  . aspirin EC 81 MG tablet Take 81 mg by mouth daily.    . carvedilol (COREG) 25 MG tablet TAKE 1 TABLET BY MOUTH TWICE A DAY WITH MEALS 180 tablet 2  . Cholecalciferol (VITAMIN D3) 1000 UNITS tablet Take 2,000 Units by mouth daily.     . Coenzyme Q10 (CO Q-10) 200 MG CAPS Take 2 capsules by mouth daily.    . Folic Acid 0.8 MG CAPS Take 2 capsules by mouth every morning.  30 each 0  . MEGARED OMEGA-3 KRILL OIL 500 MG CAPS Take 1 capsule by mouth every morning. 100 capsule 3  . metFORMIN (GLUCOPHAGE XR) 750 MG 24 hr tablet Take 1 tablet (750 mg total) by mouth daily with breakfast. 90 tablet 3  . ONE TOUCH ULTRA TEST test strip TEST TWICE  A DAY AS DIRECTED 100 each 3  . ONETOUCH DELICA LANCETS 99991111 MISC USE AS DIRECTED 100 each 3  . traMADol (ULTRAM) 50 MG tablet Take 1-2 tablets (50-100 mg total) by mouth every 12 (twelve) hours as needed for moderate pain or severe pain. 180 tablet 1  . traZODone (DESYREL) 50 MG tablet Take 1-2 tablets (50-100 mg total) by mouth at bedtime as needed for sleep. 60 tablet 5  . Turmeric 500 MG CAPS Take 1 capsule by mouth 2 (two) times daily.    . rosuvastatin (CRESTOR) 10 MG tablet Take 1 tablet (10 mg total) by mouth daily. 90 tablet 3  . pioglitazone (ACTOS) 45 MG  tablet Take 22.5 mg by mouth every other day.     No facility-administered medications prior to visit.     Allergies:   Olmesartan and Symbicort   Social History   Social History  . Marital Status: Married    Spouse Name: N/A  . Number of Children: 3  . Years of Education: N/A   Occupational History  . Professional    Social History Main Topics  . Smoking status: Former Smoker -- 1.50 packs/day for 40 years    Types: Cigarettes    Quit date: 08/13/2001  . Smokeless tobacco: Former Systems developer     Comment: Counseled to remain smoke free.  . Alcohol Use: 6.0 oz/week    10 Glasses of wine per week  . Drug Use: No  . Sexual Activity: Yes   Other Topics Concern  . None   Social History Narrative   Regular Exercise -  YES; riding           Family History:  The patient's family history includes Coronary artery disease in his other; Diabetes in his brother, father, and sister; Heart disease (age of onset: 1) in his father; Heart disease (age of onset: 55) in his sister; Hypertension in his brother, father, and other; Multiple sclerosis in his mother; Parkinsonism in his brother. There is no history of Colon cancer, Esophageal cancer, or Stomach cancer.   ROS:   Please see the history of present illness.    ROS All other systems reviewed and are negative.   PHYSICAL EXAM:   VS:  BP 116/74 mmHg  Pulse 59  Ht 5\' 4"  (1.626 m)  Wt 85.73 kg (189 lb)  BMI 32.43 kg/m2   GEN: Well nourished, well developed, in no acute distress HEENT: normal Neck: no JVD, carotid bruits, or masses Cardiac: RRR, 1-2/6 early peaking systolic ejection murmur in the aortic focus, no diastolic murmurs, rubs, or gallops,no edema  Respiratory:  clear to auscultation bilaterally, normal work of breathing GI: soft, nontender, nondistended, + BS MS: no deformity or atrophy Skin: warm and dry, no rash Neuro:  Alert and Oriented x 3, Strength and sensation are intact Psych: euthymic mood, full affect  Wt  Readings from Last 3 Encounters:  09/18/15 85.73 kg (189 lb)  08/30/15 85.73 kg (189 lb)  08/09/15 85.73 kg (189 lb)      Studies/Labs Reviewed:   EKG:  EKG is not ordered today.   Recent Labs: 08/02/2015: ALT 21; BUN 18; Creatinine, Ser 0.85; Hemoglobin 15.8; Platelets 212.0; Potassium 4.2; Sodium 138; TSH 1.69   Lipid Panel    Component Value Date/Time   CHOL 212* 08/02/2015 0722   TRIG 106.0 08/02/2015 0722   HDL 84.90 08/02/2015 0722   CHOLHDL 2 08/02/2015 0722   VLDL 21.2 08/02/2015 0722   LDLCALC 106* 08/02/2015 [id number]  ASSESSMENT:    1. Coronary artery calcification seen on CT scan   2. Essential hypertension   3. Controlled type 2 diabetes mellitus without complication, without long-term current use of insulin ([place])   4. Dyslipidemia      PLAN:  In order of problems listed above:  1. The presence of coronary artery calcification is a marker of atherosclerotic burden and suggests we should pursue aggressive treatment of coronary risk factors. Thankfully, this point he does not appear to have any hemodynamically important coronary lesions, no evidence of ischemia and has preserved left ventricular systolic function.  2. Excellent blood pressure control 3. Good glycemic control 4. Target LDL cholesterol definitely less than 100, preferably less than 70. Unfortunately he seems to be developing myalgia or with rosuvastatin, just like he did with atorvastatin in the past. We'll try reducing the dose of rosuvastatin to 10 mg weekly, evaluate lipid profiles in a couple of months. Consider adding ezetimibe. Unfortunately, in the absence of clear-cut complications of vascular disease, I doubt we could secure funding from his insurance company for [place] inhibitors.    Medication Adjustments/Labs and Tests Ordered: Current medicines are reviewed at length with the patient today.  Concerns regarding medicines are outlined above.  Medication changes, Labs and Tests ordered  today are listed in the Patient Instructions below. Patient Instructions  Dr Sallyanne Kuster has recommended making the following medication changes: 1. DECREASE Crestor to 10 mg (1 tablet) by mouth ONCE A WEEK  Dr Sallyanne Kuster recommends that you schedule a follow-up appointment in 1 year. You will receive a reminder letter in the mail two months in advance. If you don't receive a letter, please call our office to schedule the follow-up appointment.  If you need a refill on your cardiac medications before your next appointment, please call your pharmacy.    Mikael Spray, MD  09/18/2015 5:25 PM    [facility] [place], [place], [place]  [address] Phone: [phone]; Fax: [phone]

## 2015-09-18 NOTE — Patient Instructions (Signed)
Dr Sallyanne Kuster has recommended making the following medication changes: 1. DECREASE Crestor to 10 mg (1 tablet) by mouth ONCE A WEEK  Dr Sallyanne Kuster recommends that you schedule a follow-up appointment in 1 year. You will receive a reminder letter in the mail two months in advance. If you don't receive a letter, please call our office to schedule the follow-up appointment.  If you need a refill on your cardiac medications before your next appointment, please call your pharmacy.

## 2015-10-12 ENCOUNTER — Telehealth: Payer: Self-pay | Admitting: Cardiovascular Disease

## 2015-10-12 NOTE — Telephone Encounter (Signed)
SPOKE TO PATIENT  NOT SURE WHO CALLED? PATIENT HE SAW CALLER ID  INFORMED PATIENT THAT WILL CALL BACK. PATIENT VERBALIZED UNDERSTANDING.

## 2015-10-12 NOTE — Telephone Encounter (Signed)
New problem ° ° °Pt returning call.  °

## 2015-10-30 DIAGNOSIS — H43823 Vitreomacular adhesion, bilateral: Secondary | ICD-10-CM | POA: Diagnosis not present

## 2015-11-03 ENCOUNTER — Other Ambulatory Visit: Payer: Self-pay | Admitting: Internal Medicine

## 2015-11-22 DIAGNOSIS — Z79899 Other long term (current) drug therapy: Secondary | ICD-10-CM | POA: Diagnosis not present

## 2015-11-22 DIAGNOSIS — I2510 Atherosclerotic heart disease of native coronary artery without angina pectoris: Secondary | ICD-10-CM | POA: Diagnosis not present

## 2015-11-22 DIAGNOSIS — I2584 Coronary atherosclerosis due to calcified coronary lesion: Secondary | ICD-10-CM | POA: Diagnosis not present

## 2015-11-22 LAB — LIPID PANEL
CHOLESTEROL: 193 mg/dL (ref 125–200)
HDL: 93 mg/dL (ref >=40)
LDL Cholesterol: 74 mg/dL (ref <130)
TRIGLYCERIDES: 130 mg/dL (ref <150)
Total CHOL/HDL Ratio: 2.1 Ratio (ref <=5.0)
VLDL: 26 mg/dL (ref <30)

## 2015-11-22 LAB — HEPATIC FUNCTION PANEL
ALBUMIN: 4.1 g/dL (ref 3.6–5.1)
ALT: 16 U/L (ref 9–46)
AST: 17 U/L (ref 10–35)
Alkaline Phosphatase: 81 U/L (ref 40–115)
Bilirubin, Direct: 0.1 mg/dL (ref <=0.2)
Indirect Bilirubin: 0.3 mg/dL (ref 0.2–1.2)
TOTAL PROTEIN: 6.6 g/dL (ref 6.1–8.1)
Total Bilirubin: 0.4 mg/dL (ref 0.2–1.2)

## 2015-12-11 ENCOUNTER — Other Ambulatory Visit (INDEPENDENT_AMBULATORY_CARE_PROVIDER_SITE_OTHER): Payer: PPO

## 2015-12-11 DIAGNOSIS — E119 Type 2 diabetes mellitus without complications: Secondary | ICD-10-CM | POA: Diagnosis not present

## 2015-12-11 LAB — BASIC METABOLIC PANEL
BUN: 15 mg/dL (ref 6–23)
CALCIUM: 9.2 mg/dL (ref 8.4–10.5)
CO2: 28 meq/L (ref 19–32)
Chloride: 102 mEq/L (ref 96–112)
Creatinine, Ser: 0.92 mg/dL (ref 0.40–1.50)
GFR: 86.06 mL/min (ref >60.00)
Glucose, Bld: 124 mg/dL — ABNORMAL HIGH (ref 70–99)
Potassium: 4.3 mEq/L (ref 3.5–5.1)
SODIUM: 137 meq/L (ref 135–145)

## 2015-12-11 LAB — HEMOGLOBIN A1C: HEMOGLOBIN A1C: 6.6 % — AB (ref 4.6–6.5)

## 2015-12-13 ENCOUNTER — Ambulatory Visit (INDEPENDENT_AMBULATORY_CARE_PROVIDER_SITE_OTHER): Payer: PPO | Admitting: Internal Medicine

## 2015-12-13 ENCOUNTER — Encounter: Payer: Self-pay | Admitting: Internal Medicine

## 2015-12-13 VITALS — BP 110/60 | HR 60 | Wt 188.0 lb

## 2015-12-13 DIAGNOSIS — M5440 Lumbago with sciatica, unspecified side: Secondary | ICD-10-CM | POA: Diagnosis not present

## 2015-12-13 DIAGNOSIS — E119 Type 2 diabetes mellitus without complications: Secondary | ICD-10-CM | POA: Diagnosis not present

## 2015-12-13 DIAGNOSIS — E785 Hyperlipidemia, unspecified: Secondary | ICD-10-CM | POA: Diagnosis not present

## 2015-12-13 MED ORDER — PRAVASTATIN SODIUM 20 MG PO TABS: 20.0000 mg | ORAL_TABLET | Freq: Every day | ORAL | Status: DC

## 2015-12-13 NOTE — Assessment & Plan Note (Signed)
7/17 d/c Crestor, start Pravastatin

## 2015-12-13 NOTE — Patient Instructions (Signed)
Stop    Hemoglobin A1c Test Some of the sugar (glucose) that circulates in your blood sticks or binds to blood proteins. Hemoglobin (Hb or Hgb) is one type of blood protein that glucose binds to. It also carries oxygen in the red blood cells (RBCs). When glucose binds to Hb, the glucose-coated Hb is called glycated Hb. Once Hb is glycated, it remains that way for the life of the RBC. This is about 120 days. Rather than testing your blood glucose level on one single day, the hemoglobin A1c (HbA1c) test measures the average amount of glycated hemoglobin and, therefore, the average amount of glucose in your blood during the 3-4 months just before the test is done. The HbA1c test is used to monitor long-term control of blood sugar in people who have diabetes mellitus. The HbA1c test can also be used in addition to or in combination with fasting blood glucose level and oral glucose tolerance tests. RESULTS It is your responsibility to obtain your test results. Ask the lab or department performing the test when and how you will get your results. Contact your health care provider to discuss any questions you have about your results. Range of Normal Values Ranges for normal values may vary among different labs and hospitals. You should always check with your health care provider after having lab work or other tests done to discuss the meaning of your test results and whether your values are considered within normal limits. The ranges for normal HbA1c test results are as follows:  Adult or child without diabetes: 4-5.9%.  Adult or child with diabetes and good blood glucose control: less than 6.5%. Several factors can affect HbA1c test results. These may include:  Diseases (hemoglobinopathies) that cause a change in the shape, size, or amount of Hb in your blood.  Longer than normal RBC life span.  Abnormally low levels of certain proteins in your blood.  Eating foods or taking supplements that are high  in vitamin C (ascorbic acid). Meaning of Results Outside Normal Value Ranges Abnormally high HbA1c values are most commonly an indication of prediabetes mellitus and diabetes mellitus:  An HbA1c result of 5.7-6.4% is considered diagnostic of prediabetes mellitus.  An HbA1c result of 6.5% or higher on two separate occasions is considered diagnostic of diabetes mellitus. Abnormally low HbA1c values can be caused by several health conditions. These may include:  Pregnancy.  A large amount of blood loss.  Blood transfusions.  Low red blood cell count (anemia). This is caused by premature destruction of red blood cells.  Long-term kidney failure.  Some unusual forms of Hb (Hb variants), such as sickle cell trait. Discuss your test results with your health care provider. He or she will use the results to make a diagnosis and determine a treatment plan that is right for you.   This information is not intended to replace advice given to you by your health care provider. Make sure you discuss any questions you have with your health care provider.   Document Released: 06/04/2004 Document Revised: 06/03/2014 Document Reviewed: 09/27/2013 Elsevier Interactive Patient Education 2016 Klein, start Pravastatin

## 2015-12-13 NOTE — Progress Notes (Signed)
Pre visit review using our clinic review tool, if applicable. No additional management support is needed unless otherwise documented below in the visit note. 

## 2015-12-13 NOTE — Assessment & Plan Note (Signed)
Metfomin XR, cont Actos

## 2015-12-13 NOTE — Progress Notes (Signed)
Subjective:  Patient ID: [name]., male    DOB: 1946-05-22  Age: 70 y.o. MRN: [id number]  CC: No chief complaint on file.   HPI [name]. presents for DM, CAD, HTN f/u. C/o insomnia. C/o LBP on Crestor  Outpatient Prescriptions Prior to Visit  Medication Sig Dispense Refill  . acetaminophen (TYLENOL) 650 MG CR tablet Take 650 mg by mouth 2 (two) times daily as needed for pain.     [name] ALPRAZolam (XANAX) 0.5 MG tablet Take 1-2 tablets (0.5-1 mg total) by mouth at bedtime as needed for anxiety or sleep. 180 tablet 1  . amLODipine (NORVASC) 10 MG tablet Take 1 tablet (10 mg total) by mouth daily before breakfast. 90 tablet 3  . aspirin EC 81 MG tablet Take 81 mg by mouth daily.    . carvedilol (COREG) 25 MG tablet TAKE 1 TABLET BY MOUTH TWICE A DAY WITH MEALS 180 tablet 1  . Cholecalciferol (VITAMIN D3) 1000 UNITS tablet Take 2,000 Units by mouth daily.     . Coenzyme Q10 (CO Q-10) 200 MG CAPS Take 2 capsules by mouth daily.    . Folic Acid 0.8 MG CAPS Take 2 capsules by mouth every morning.  30 each 0  . MEGARED OMEGA-3 KRILL OIL 500 MG CAPS Take 1 capsule by mouth every morning. 100 capsule 3  . metFORMIN (GLUCOPHAGE XR) 750 MG 24 hr tablet Take 1 tablet (750 mg total) by mouth daily with breakfast. 90 tablet 3  . ONE TOUCH ULTRA TEST test strip TEST TWICE A DAY AS DIRECTED 100 each 3  . ONETOUCH DELICA LANCETS 99991111 MISC USE AS DIRECTED 100 each 3  . traMADol (ULTRAM) 50 MG tablet Take 1-2 tablets (50-100 mg total) by mouth every 12 (twelve) hours as needed for moderate pain or severe pain. 180 tablet 1  . Turmeric 500 MG CAPS Take 1 capsule by mouth 2 (two) times daily.    . pioglitazone (ACTOS) 45 MG tablet Take 22.5 mg by mouth every other day.    . rosuvastatin (CRESTOR) 10 MG tablet Take 1 tablet (10 mg total) by mouth once a week. (Patient not taking: Reported on 12/13/2015) 30 tablet 3  . traZODone (DESYREL) 50 MG tablet Take 1-2 tablets (50-100 mg total) by  mouth at bedtime as needed for sleep. (Patient not taking: Reported on 12/13/2015) 60 tablet 5   No facility-administered medications prior to visit.    ROS Review of Systems  Constitutional: Negative for appetite change, fatigue and unexpected weight change.  HENT: Negative for congestion, nosebleeds, sneezing, sore throat and trouble swallowing.   Eyes: Negative for itching and visual disturbance.  Respiratory: Negative for cough.   Cardiovascular: Negative for chest pain, palpitations and leg swelling.  Gastrointestinal: Negative for nausea, diarrhea, blood in stool and abdominal distention.  Genitourinary: Negative for frequency and hematuria.  Musculoskeletal: Positive for back pain. Negative for joint swelling, gait problem and neck pain.  Skin: Negative for rash.  Neurological: Negative for dizziness, tremors, speech difficulty and weakness.  Psychiatric/Behavioral: Positive for sleep disturbance. Negative for dysphoric mood and agitation. The patient is not nervous/anxious.     Objective:  BP 110/60 mmHg  Pulse 60  Wt 188 lb (85.276 kg)  SpO2 99%  BP Readings from Last 3 Encounters:  12/13/15 110/60  09/18/15 116/74  08/09/15 108/70    Wt Readings from Last 3 Encounters:  12/13/15 188 lb (85.276 kg)  09/18/15 189 lb (85.73 kg)  08/30/15  189 lb (85.73 kg)    Physical Exam  Constitutional: He is oriented to person, place, and time. He appears well-developed. No distress.  NAD  HENT:  Mouth/Throat: Oropharynx is clear and moist.  Eyes: Conjunctivae are normal. Pupils are equal, round, and reactive to light.  Neck: Normal range of motion. No JVD present. No thyromegaly present.  Cardiovascular: Normal rate, regular rhythm, normal heart sounds and intact distal pulses.  Exam reveals no gallop and no friction rub.   No murmur heard. Pulmonary/Chest: Effort normal and breath sounds normal. No respiratory distress. He has no wheezes. He has no rales. He exhibits no  tenderness.  Abdominal: Soft. Bowel sounds are normal. He exhibits no distension and no mass. There is no tenderness. There is no rebound and no guarding.  Musculoskeletal: Normal range of motion. He exhibits no edema or tenderness.  Lymphadenopathy:    He has no cervical adenopathy.  Neurological: He is alert and oriented to person, place, and time. He has normal reflexes. No cranial nerve deficit. He exhibits normal muscle tone. He displays a negative Romberg sign. Coordination and gait normal.  Skin: Skin is warm and dry. No rash noted.  Psychiatric: He has a normal mood and affect. His behavior is normal. Judgment and thought content normal.    Lab Results  Component Value Date   WBC 6.4 08/02/2015   HGB 15.8 08/02/2015   HCT 44.5 08/02/2015   PLT 212.0 08/02/2015   GLUCOSE 124* 12/11/2015   CHOL 193 11/22/2015   TRIG 130 11/22/2015   HDL 93 11/22/2015   LDLCALC 74 11/22/2015   ALT 16 11/22/2015   AST 17 11/22/2015   NA 137 12/11/2015   K 4.3 12/11/2015   CL 102 12/11/2015   CREATININE 0.92 12/11/2015   BUN 15 12/11/2015   CO2 28 12/11/2015   TSH 1.69 08/02/2015   PSA 1.13 08/02/2015   HGBA1C 6.6* 12/11/2015   MICROALBUR 1.5 08/02/2015    No results found.  Assessment & Plan:   There are no diagnoses linked to this encounter. I am having [name] maintain his cholecalciferol, Folic Acid, pioglitazone, ONETOUCH DELICA LANCETS 99991111, ONE TOUCH ULTRA TEST, traZODone, traMADol, Co Q-10, acetaminophen, Turmeric, MEGARED OMEGA-3 KRILL OIL, metFORMIN, aspirin EC, amLODipine, ALPRAZolam, rosuvastatin, and carvedilol.  No orders of the defined types were placed in this encounter.     Follow-up: No Follow-up on file.  Chase Kehr, MD

## 2015-12-19 ENCOUNTER — Other Ambulatory Visit: Payer: Self-pay | Admitting: *Deleted

## 2015-12-19 MED ORDER — TRAZODONE HCL 50 MG PO TABS: 50.0000 mg | ORAL_TABLET | Freq: Every evening | ORAL | 5 refills | Status: DC | PRN

## 2016-01-17 ENCOUNTER — Other Ambulatory Visit: Payer: Self-pay | Admitting: Internal Medicine

## 2016-01-22 ENCOUNTER — Ambulatory Visit: Payer: PPO | Admitting: Family

## 2016-01-22 ENCOUNTER — Other Ambulatory Visit: Payer: Self-pay | Admitting: *Deleted

## 2016-01-22 MED ORDER — TRAZODONE HCL 50 MG PO TABS: 50.0000 mg | ORAL_TABLET | Freq: Every evening | ORAL | 1 refills | Status: DC | PRN

## 2016-01-22 NOTE — Telephone Encounter (Signed)
Rec'd fax insurance require a 90 day on prescriptions. Needing Trazodone change to a 90 day supply instead on 30. Sent updated script,,,/lmb

## 2016-03-05 ENCOUNTER — Other Ambulatory Visit: Payer: Self-pay | Admitting: Internal Medicine

## 2016-03-05 DIAGNOSIS — H52223 Regular astigmatism, bilateral: Secondary | ICD-10-CM | POA: Diagnosis not present

## 2016-03-05 DIAGNOSIS — H524 Presbyopia: Secondary | ICD-10-CM | POA: Diagnosis not present

## 2016-03-05 DIAGNOSIS — H35373 Puckering of macula, bilateral: Secondary | ICD-10-CM | POA: Diagnosis not present

## 2016-03-05 DIAGNOSIS — E119 Type 2 diabetes mellitus without complications: Secondary | ICD-10-CM | POA: Diagnosis not present

## 2016-03-05 DIAGNOSIS — H5203 Hypermetropia, bilateral: Secondary | ICD-10-CM | POA: Diagnosis not present

## 2016-03-08 NOTE — Telephone Encounter (Signed)
Done

## 2016-04-03 ENCOUNTER — Other Ambulatory Visit (INDEPENDENT_AMBULATORY_CARE_PROVIDER_SITE_OTHER): Payer: PPO

## 2016-04-03 DIAGNOSIS — E119 Type 2 diabetes mellitus without complications: Secondary | ICD-10-CM

## 2016-04-03 DIAGNOSIS — M5440 Lumbago with sciatica, unspecified side: Secondary | ICD-10-CM

## 2016-04-03 DIAGNOSIS — E785 Hyperlipidemia, unspecified: Secondary | ICD-10-CM | POA: Diagnosis not present

## 2016-04-03 LAB — HEMOGLOBIN A1C: Hgb A1c MFr Bld: 6.7 % — ABNORMAL HIGH (ref 4.6–6.5)

## 2016-04-03 LAB — LIPID PANEL
Cholesterol: 193 mg/dL (ref 0–200)
HDL: 90.6 mg/dL (ref >39.00)
LDL Cholesterol: 84 mg/dL (ref 0–99)
NonHDL: 102.13
TRIGLYCERIDES: 92 mg/dL (ref 0.0–149.0)
Total CHOL/HDL Ratio: 2
VLDL: 18.4 mg/dL (ref 0.0–40.0)

## 2016-04-03 LAB — BASIC METABOLIC PANEL
BUN: 13 mg/dL (ref 6–23)
CO2: 30 mEq/L (ref 19–32)
CREATININE: 0.87 mg/dL (ref 0.40–1.50)
Calcium: 9.3 mg/dL (ref 8.4–10.5)
Chloride: 102 mEq/L (ref 96–112)
GFR: 91.71 mL/min (ref >60.00)
Glucose, Bld: 115 mg/dL — ABNORMAL HIGH (ref 70–99)
POTASSIUM: 4.4 meq/L (ref 3.5–5.1)
Sodium: 138 mEq/L (ref 135–145)

## 2016-04-03 LAB — HEPATIC FUNCTION PANEL
ALBUMIN: 3.9 g/dL (ref 3.5–5.2)
ALT: 16 U/L (ref 0–53)
AST: 17 U/L (ref 0–37)
Alkaline Phosphatase: 64 U/L (ref 39–117)
Bilirubin, Direct: 0.1 mg/dL (ref 0.0–0.3)
TOTAL PROTEIN: 6.6 g/dL (ref 6.0–8.3)
Total Bilirubin: 0.5 mg/dL (ref 0.2–1.2)

## 2016-04-03 LAB — CK: CK TOTAL: 80 U/L (ref 7–232)

## 2016-04-10 ENCOUNTER — Ambulatory Visit (INDEPENDENT_AMBULATORY_CARE_PROVIDER_SITE_OTHER): Payer: PPO | Admitting: Internal Medicine

## 2016-04-10 ENCOUNTER — Encounter: Payer: Self-pay | Admitting: Internal Medicine

## 2016-04-10 ENCOUNTER — Telehealth: Payer: Self-pay | Admitting: Emergency Medicine

## 2016-04-10 DIAGNOSIS — E785 Hyperlipidemia, unspecified: Secondary | ICD-10-CM | POA: Diagnosis not present

## 2016-04-10 DIAGNOSIS — E119 Type 2 diabetes mellitus without complications: Secondary | ICD-10-CM

## 2016-04-10 DIAGNOSIS — I2510 Atherosclerotic heart disease of native coronary artery without angina pectoris: Secondary | ICD-10-CM

## 2016-04-10 DIAGNOSIS — Z23 Encounter for immunization: Secondary | ICD-10-CM

## 2016-04-10 MED ORDER — DOXEPIN HCL 50 MG PO CAPS: 50.0000 mg | ORAL_CAPSULE | Freq: Every day | ORAL | 5 refills | Status: DC

## 2016-04-10 MED ORDER — DOXEPIN HCL 6 MG PO TABS: ORAL_TABLET | ORAL | 5 refills | Status: DC

## 2016-04-10 MED ORDER — DOXEPIN HCL 6 MG PO TABS: ORAL_TABLET | ORAL | 2 refills | Status: DC

## 2016-04-10 NOTE — Progress Notes (Signed)
Subjective:  Patient ID: [name]., male    DOB: Nov 12, 1945  Age: 70 y.o. MRN: [id number]  CC: No chief complaint on file.   HPI [name]. presents for DM, HTN, dyslipidemia, CAD. C/o insomnia  Outpatient Medications Prior to Visit  Medication Sig Dispense Refill  . acetaminophen (TYLENOL) 650 MG CR tablet Take 650 mg by mouth 2 (two) times daily as needed for pain.     [name] ALPRAZolam (XANAX) 0.5 MG tablet TAKE 1 TO 2 TABLETS BY MOUTH AT BEDTIME AS NEEDED FOR ANXIETY OR SLEEP 180 tablet 1  . amLODipine (NORVASC) 10 MG tablet Take 1 tablet (10 mg total) by mouth daily before breakfast. 90 tablet 3  . aspirin EC 81 MG tablet Take 81 mg by mouth daily.    . carvedilol (COREG) 25 MG tablet TAKE 1 TABLET BY MOUTH TWICE A DAY WITH MEALS 180 tablet 1  . Cholecalciferol (VITAMIN D3) 1000 UNITS tablet Take 2,000 Units by mouth daily.     . Coenzyme Q10 (CO Q-10) 200 MG CAPS Take 2 capsules by mouth daily.    . Folic Acid 0.8 MG CAPS Take 2 capsules by mouth every morning.  30 each 0  . MEGARED OMEGA-3 KRILL OIL 500 MG CAPS Take 1 capsule by mouth every morning. 100 capsule 3  . metFORMIN (GLUCOPHAGE-XR) 750 MG 24 hr tablet Take 1 tablet (750 mg total) by mouth daily with breakfast. Yearly physical due in Nov must see MD for refills 90 tablet 0  . ONE TOUCH ULTRA TEST test strip TEST TWICE A DAY AS DIRECTED 100 each 3  . ONETOUCH DELICA LANCETS 99991111 MISC USE AS DIRECTED 100 each 3  . pravastatin (PRAVACHOL) 20 MG tablet Take 1 tablet (20 mg total) by mouth daily. (Patient taking differently: Take 10 mg by mouth daily. ) 90 tablet 11  . traMADol (ULTRAM) 50 MG tablet Take 1-2 tablets (50-100 mg total) by mouth every 12 (twelve) hours as needed for moderate pain or severe pain. 180 tablet 1  . traZODone (DESYREL) 50 MG tablet Take 1-2 tablets (50-100 mg total) by mouth at bedtime as needed for sleep. 180 tablet 1  . pioglitazone (ACTOS) 45 MG tablet Take 22.5 mg by mouth every  other day.    . Turmeric 500 MG CAPS Take 1 capsule by mouth 2 (two) times daily.     No facility-administered medications prior to visit.     ROS Review of Systems  Constitutional: Negative for appetite change, fatigue and unexpected weight change.  HENT: Negative for congestion, nosebleeds, sneezing, sore throat and trouble swallowing.   Eyes: Negative for itching and visual disturbance.  Respiratory: Negative for cough.   Cardiovascular: Negative for chest pain, palpitations and leg swelling.  Gastrointestinal: Negative for abdominal distention, blood in stool, diarrhea and nausea.  Genitourinary: Negative for frequency and hematuria.  Musculoskeletal: Negative for back pain, gait problem, joint swelling and neck pain.  Skin: Negative for rash.  Neurological: Negative for dizziness, tremors, speech difficulty and weakness.  Psychiatric/Behavioral: Negative for agitation, dysphoric mood and sleep disturbance. The patient is not nervous/anxious.     Objective:  BP 102/80   Pulse (!) 57   Temp 97.7 F (36.5 C) (Oral)   Wt 189 lb (85.7 kg)   SpO2 94%   BMI 32.44 kg/m   BP Readings from Last 3 Encounters:  04/10/16 102/80  12/13/15 110/60  09/18/15 116/74    Wt Readings from Last 3 Encounters:  04/10/16 189 lb (85.7 kg)  12/13/15 188 lb (85.3 kg)  09/18/15 189 lb (85.7 kg)    Physical Exam  Constitutional: He is oriented to person, place, and time. He appears well-developed. No distress.  NAD  HENT:  Mouth/Throat: Oropharynx is clear and moist.  Eyes: Conjunctivae are normal. Pupils are equal, round, and reactive to light.  Neck: Normal range of motion. No JVD present. No thyromegaly present.  Cardiovascular: Normal rate, regular rhythm, normal heart sounds and intact distal pulses.  Exam reveals no gallop and no friction rub.   No murmur heard. Pulmonary/Chest: Effort normal and breath sounds normal. No respiratory distress. He has no wheezes. He has no rales. He  exhibits no tenderness.  Abdominal: Soft. Bowel sounds are normal. He exhibits no distension and no mass. There is no tenderness. There is no rebound and no guarding.  Musculoskeletal: Normal range of motion. He exhibits no edema or tenderness.  Lymphadenopathy:    He has no cervical adenopathy.  Neurological: He is alert and oriented to person, place, and time. He has normal reflexes. No cranial nerve deficit. He exhibits normal muscle tone. He displays a negative Romberg sign. Coordination and gait normal.  Skin: Skin is warm and dry. No rash noted.  Psychiatric: He has a normal mood and affect. His behavior is normal. Judgment and thought content normal.    Lab Results  Component Value Date   WBC 6.4 08/02/2015   HGB 15.8 08/02/2015   HCT 44.5 08/02/2015   PLT 212.0 08/02/2015   GLUCOSE 115 (H) 04/03/2016   CHOL 193 04/03/2016   TRIG 92.0 04/03/2016   HDL 90.60 04/03/2016   LDLCALC 84 04/03/2016   ALT 16 04/03/2016   AST 17 04/03/2016   NA 138 04/03/2016   K 4.4 04/03/2016   CL 102 04/03/2016   CREATININE 0.87 04/03/2016   BUN 13 04/03/2016   CO2 30 04/03/2016   TSH 1.69 08/02/2015   PSA 1.13 08/02/2015   HGBA1C 6.7 (H) 04/03/2016   MICROALBUR 1.5 08/02/2015    No results found.  Assessment & Plan:   There are no diagnoses linked to this encounter. I am having [name] maintain his cholecalciferol, Folic Acid, pioglitazone, ONETOUCH DELICA LANCETS 99991111, ONE TOUCH ULTRA TEST, traMADol, Co Q-10, acetaminophen, Turmeric, MEGARED OMEGA-3 KRILL OIL, aspirin EC, amLODipine, carvedilol, pravastatin, metFORMIN, traZODone, and ALPRAZolam.  No orders of the defined types were placed in this encounter.    Follow-up: No Follow-up on file.  [name], MD

## 2016-04-10 NOTE — Assessment & Plan Note (Signed)
Coreg and Amlodipine 

## 2016-04-10 NOTE — Progress Notes (Signed)
Pre visit review using our clinic review tool, if applicable. No additional management support is needed unless otherwise documented below in the visit note. 

## 2016-04-10 NOTE — Telephone Encounter (Signed)
Patient is calling about this again. Please follow up, Thank you.

## 2016-04-10 NOTE — Telephone Encounter (Signed)
Pts wife called and stated the prescription Dr Camila Li wrote for patient today is too expensive. Can he can a different prescription that is cheaper? Please advise thanks.

## 2016-04-10 NOTE — Assessment & Plan Note (Signed)
PRAVASTATIN

## 2016-04-10 NOTE — Assessment & Plan Note (Signed)
METFORMIN, ACTOS

## 2016-04-10 NOTE — Assessment & Plan Note (Signed)
ASA, Pravachol Dr Croitoru 

## 2016-04-10 NOTE — Telephone Encounter (Signed)
Emailed Rx for Doxepin 50 mg - is it less expensive? Thx

## 2016-04-11 NOTE — Telephone Encounter (Signed)
Pts wife informed.

## 2016-04-22 ENCOUNTER — Other Ambulatory Visit: Payer: Self-pay | Admitting: Internal Medicine

## 2016-04-30 ENCOUNTER — Telehealth: Payer: Self-pay | Admitting: Internal Medicine

## 2016-04-30 MED ORDER — DOXEPIN HCL 10 MG PO CAPS: 10.0000 mg | ORAL_CAPSULE | Freq: Every evening | ORAL | 5 refills | Status: DC | PRN

## 2016-04-30 NOTE — Telephone Encounter (Signed)
Patients wife called in advising that his pharmacy sent in a request asking to refll/ change dosage on the doxepin (SINEQUAN) 50 MG capsule [id number] . Because it puts him to sleep too much, he wants to lower it to 10 mg - taking 1 or 2 at night PRN. Please advise patient.

## 2016-04-30 NOTE — Telephone Encounter (Signed)
OK. Thx

## 2016-05-01 ENCOUNTER — Telehealth: Payer: Self-pay

## 2016-05-01 ENCOUNTER — Other Ambulatory Visit: Payer: Self-pay | Admitting: Internal Medicine

## 2016-05-01 NOTE — Telephone Encounter (Signed)
PA initiated via CoverMyMeds key [id number]

## 2016-05-02 NOTE — Telephone Encounter (Signed)
Left detailed mess informing pt new Rx has been sent.

## 2016-05-15 ENCOUNTER — Other Ambulatory Visit: Payer: Self-pay | Admitting: Internal Medicine

## 2016-05-28 ENCOUNTER — Other Ambulatory Visit: Payer: Self-pay | Admitting: Acute Care

## 2016-05-28 DIAGNOSIS — Z87891 Personal history of nicotine dependence: Secondary | ICD-10-CM

## 2016-05-31 DIAGNOSIS — E119 Type 2 diabetes mellitus without complications: Secondary | ICD-10-CM | POA: Diagnosis not present

## 2016-05-31 DIAGNOSIS — H43823 Vitreomacular adhesion, bilateral: Secondary | ICD-10-CM | POA: Diagnosis not present

## 2016-06-17 ENCOUNTER — Encounter: Payer: Self-pay | Admitting: Acute Care

## 2016-06-17 NOTE — Telephone Encounter (Signed)
Forwarding to lung nodule pool  

## 2016-06-20 ENCOUNTER — Other Ambulatory Visit: Payer: Self-pay | Admitting: Acute Care

## 2016-06-20 DIAGNOSIS — Z87891 Personal history of nicotine dependence: Secondary | ICD-10-CM

## 2016-06-20 NOTE — Telephone Encounter (Signed)
The LDCT order was already placed, spoke with Rodena Piety to see if we can get pt scheduled E-mail sent to patient Nothing further needed, will sign off

## 2016-06-20 NOTE — Telephone Encounter (Signed)
E-mail was erroneously addressed to the incorrect patient The body and subject of the e-mail are correct LMOM TCB x1 to speak with patient and apologize Will also send e-mail

## 2016-07-04 ENCOUNTER — Inpatient Hospital Stay: Admission: RE | Admit: 2016-07-04 | Payer: PPO | Source: Ambulatory Visit

## 2016-07-09 ENCOUNTER — Ambulatory Visit (INDEPENDENT_AMBULATORY_CARE_PROVIDER_SITE_OTHER)
Admission: RE | Admit: 2016-07-09 | Discharge: 2016-07-09 | Disposition: A | Discharge status: Home / Self Care | Payer: PPO | Source: Ambulatory Visit | Attending: Acute Care | Admitting: Acute Care

## 2016-07-09 DIAGNOSIS — Z87891 Personal history of nicotine dependence: Secondary | ICD-10-CM | POA: Diagnosis not present

## 2016-07-24 ENCOUNTER — Other Ambulatory Visit (INDEPENDENT_AMBULATORY_CARE_PROVIDER_SITE_OTHER): Payer: PPO

## 2016-07-24 DIAGNOSIS — E119 Type 2 diabetes mellitus without complications: Secondary | ICD-10-CM

## 2016-07-24 LAB — BASIC METABOLIC PANEL
BUN: 16 mg/dL (ref 6–23)
CALCIUM: 9.5 mg/dL (ref 8.4–10.5)
CHLORIDE: 102 meq/L (ref 96–112)
CO2: 28 meq/L (ref 19–32)
CREATININE: 0.93 mg/dL (ref 0.40–1.50)
GFR: 84.84 mL/min (ref >60.00)
GLUCOSE: 123 mg/dL — AB (ref 70–99)
Potassium: 4.4 mEq/L (ref 3.5–5.1)
Sodium: 140 mEq/L (ref 135–145)

## 2016-07-24 LAB — HEMOGLOBIN A1C: Hgb A1c MFr Bld: 6.9 % — ABNORMAL HIGH (ref 4.6–6.5)

## 2016-07-26 ENCOUNTER — Telehealth: Payer: Self-pay | Admitting: Acute Care

## 2016-07-26 DIAGNOSIS — Z87891 Personal history of nicotine dependence: Secondary | ICD-10-CM

## 2016-07-26 NOTE — Telephone Encounter (Signed)
Chase Form, NP spoke with pt and advised of low dose ct results.  Copy sent to Dr Alain Marion. Order placed for 1 yr f/u low dose ct.

## 2016-07-31 ENCOUNTER — Other Ambulatory Visit: Payer: Self-pay | Admitting: *Deleted

## 2016-07-31 ENCOUNTER — Ambulatory Visit (INDEPENDENT_AMBULATORY_CARE_PROVIDER_SITE_OTHER): Payer: PPO | Admitting: Internal Medicine

## 2016-07-31 ENCOUNTER — Encounter: Payer: Self-pay | Admitting: Internal Medicine

## 2016-07-31 DIAGNOSIS — I2584 Coronary atherosclerosis due to calcified coronary lesion: Secondary | ICD-10-CM

## 2016-07-31 DIAGNOSIS — E119 Type 2 diabetes mellitus without complications: Secondary | ICD-10-CM | POA: Diagnosis not present

## 2016-07-31 DIAGNOSIS — I2510 Atherosclerotic heart disease of native coronary artery without angina pectoris: Secondary | ICD-10-CM

## 2016-07-31 DIAGNOSIS — I10 Essential (primary) hypertension: Secondary | ICD-10-CM

## 2016-07-31 DIAGNOSIS — E785 Hyperlipidemia, unspecified: Secondary | ICD-10-CM | POA: Diagnosis not present

## 2016-07-31 DIAGNOSIS — F5101 Primary insomnia: Secondary | ICD-10-CM | POA: Diagnosis not present

## 2016-07-31 MED ORDER — DOXEPIN HCL 10 MG PO CAPS: 10.0000 mg | ORAL_CAPSULE | Freq: Every evening | ORAL | 2 refills | Status: DC | PRN

## 2016-07-31 NOTE — Assessment & Plan Note (Signed)
Coreg and Amlodipine

## 2016-07-31 NOTE — Progress Notes (Signed)
Pre-visit discussion using our clinic review tool. No additional management support is needed unless otherwise documented below in the visit note.  

## 2016-07-31 NOTE — Assessment & Plan Note (Signed)
Pravachol

## 2016-07-31 NOTE — Progress Notes (Signed)
Subjective:  Patient ID: Chase Mage., male    DOB: 10-25-1945  Age: 71 y.o. MRN: [id number]  CC: Follow-up (DM type 2, HTN, hyperlipidemia, CAD, )   HPI Chase Mage. presents for DM, HTN, OA anxiety f/u  Outpatient Medications Prior to Visit  Medication Sig Dispense Refill  . acetaminophen (TYLENOL) 650 MG CR tablet Take 650 mg by mouth 2 (two) times daily as needed for pain.     [name] ALPRAZolam (XANAX) 0.5 MG tablet TAKE 1 TO 2 TABLETS BY MOUTH AT BEDTIME AS NEEDED FOR ANXIETY OR SLEEP 180 tablet 1  . amLODipine (NORVASC) 10 MG tablet Take 1 tablet (10 mg total) by mouth daily before breakfast. 90 tablet 3  . aspirin EC 81 MG tablet Take 81 mg by mouth daily.    . carvedilol (COREG) 25 MG tablet TAKE 1 TABLET BY MOUTH TWICE A DAY WITH MEALS 180 tablet 1  . Cholecalciferol (VITAMIN D3) 1000 UNITS tablet Take 2,000 Units by mouth daily.     . Coenzyme Q10 (CO Q-10) 200 MG CAPS Take 2 capsules by mouth daily.    [name] doxepin (SINEQUAN) 10 MG capsule Take 1-2 capsules (10-20 mg total) by mouth at bedtime as needed. 60 capsule 5  . Doxepin HCl 6 MG TABS 1 po qhs prn insomnia 90 tablet 2  . Folic Acid 0.8 MG CAPS Take 2 capsules by mouth every morning.  30 each 0  . MEGARED OMEGA-3 KRILL OIL 500 MG CAPS Take 1 capsule by mouth every morning. 100 capsule 3  . metFORMIN (GLUCOPHAGE-XR) 750 MG 24 hr tablet TAKE 1 TABLET BY MOUTH EVERY DAY WITH BREAKFAST 90 tablet 1  . ONE TOUCH ULTRA TEST test strip TEST TWICE A DAY AS DIRECTED 100 each 3  . ONETOUCH DELICA LANCETS 56O MISC USE AS DIRECTED 100 each 3  . pioglitazone (ACTOS) 45 MG tablet Take 22.5 mg by mouth every other day.    . pravastatin (PRAVACHOL) 20 MG tablet Take 1 tablet (20 mg total) by mouth daily. (Patient taking differently: Take 10 mg by mouth daily. ) 90 tablet 11  . traZODone (DESYREL) 50 MG tablet Take 1-2 tablets (50-100 mg total) by mouth at bedtime as needed for sleep. 180 tablet 1  . amLODipine (NORVASC) 10 MG  tablet TAKE 1 TABLET (10 MG TOTAL) BY MOUTH DAILY BEFORE BREAKFAST. 90 tablet 1  . Turmeric 500 MG CAPS Take 1 capsule by mouth 2 (two) times daily.     No facility-administered medications prior to visit.     ROS Review of Systems  Constitutional: Negative for appetite change, fatigue and unexpected weight change.  HENT: Negative for congestion, nosebleeds, sneezing, sore throat and trouble swallowing.   Eyes: Negative for itching and visual disturbance.  Respiratory: Negative for cough.   Cardiovascular: Negative for chest pain, palpitations and leg swelling.  Gastrointestinal: Negative for abdominal distention, blood in stool, diarrhea and nausea.  Genitourinary: Negative for frequency and hematuria.  Musculoskeletal: Positive for back pain. Negative for gait problem, joint swelling and neck pain.  Skin: Negative for rash.  Neurological: Negative for dizziness, tremors, speech difficulty and weakness.  Psychiatric/Behavioral: Negative for agitation, dysphoric mood and sleep disturbance. The patient is not nervous/anxious.     Objective:  BP 120/80   Pulse 60   Temp 97.7 F (36.5 C) (Oral)   Resp 16   Ht 5\' 4"  (1.626 m)   Wt 189 lb 12 oz (86.1 kg)   SpO2  97%   BMI 32.57 kg/m   BP Readings from Last 3 Encounters:  07/31/16 120/80  04/10/16 102/80  12/13/15 110/60    Wt Readings from Last 3 Encounters:  07/31/16 189 lb 12 oz (86.1 kg)  04/10/16 189 lb (85.7 kg)  12/13/15 188 lb (85.3 kg)    Physical Exam  Constitutional: He is oriented to person, place, and time. He appears well-developed. No distress.  NAD  HENT:  Mouth/Throat: Oropharynx is clear and moist.  Eyes: Conjunctivae are normal. Pupils are equal, round, and reactive to light.  Neck: Normal range of motion. No JVD present. No thyromegaly present.  Cardiovascular: Normal rate, regular rhythm, normal heart sounds and intact distal pulses.  Exam reveals no gallop and no friction rub.   No murmur  heard. Pulmonary/Chest: Effort normal and breath sounds normal. No respiratory distress. He has no wheezes. He has no rales. He exhibits no tenderness.  Abdominal: Soft. Bowel sounds are normal. He exhibits no distension and no mass. There is no tenderness. There is no rebound and no guarding.  Musculoskeletal: Normal range of motion. He exhibits tenderness. He exhibits no edema.  Lymphadenopathy:    He has no cervical adenopathy.  Neurological: He is alert and oriented to person, place, and time. He has normal reflexes. No cranial nerve deficit. He exhibits normal muscle tone. He displays a negative Romberg sign. Coordination and gait normal.  Skin: Skin is warm and dry. No rash noted.  Psychiatric: He has a normal mood and affect. His behavior is normal. Judgment and thought content normal.  LS, R arm - hurt w/ROM  Lab Results  Component Value Date   WBC 6.4 08/02/2015   HGB 15.8 08/02/2015   HCT 44.5 08/02/2015   PLT 212.0 08/02/2015   GLUCOSE 123 (H) 07/24/2016   CHOL 193 04/03/2016   TRIG 92.0 04/03/2016   HDL 90.60 04/03/2016   LDLCALC 84 04/03/2016   ALT 16 04/03/2016   AST 17 04/03/2016   NA 140 07/24/2016   K 4.4 07/24/2016   CL 102 07/24/2016   CREATININE 0.93 07/24/2016   BUN 16 07/24/2016   CO2 28 07/24/2016   TSH 1.69 08/02/2015   PSA 1.13 08/02/2015   HGBA1C 6.9 (H) 07/24/2016   MICROALBUR 1.5 08/02/2015    Ct Chest Lung Ca Screen Low Dose W/o Cm  Result Date: 07/10/2016 CLINICAL DATA:  71 year old male former smoker (quit 15 years ago) with 60 pack-year history of smoking. Lung cancer screening examination. EXAM: CT CHEST WITHOUT CONTRAST LOW-DOSE FOR LUNG CANCER SCREENING TECHNIQUE: Multidetector CT imaging of the chest was performed following the standard protocol without IV contrast. COMPARISON:  Low-dose lung cancer screening chest CT 06/20/2015. FINDINGS: Cardiovascular: Heart size is normal. There is no significant pericardial fluid, thickening or  pericardial calcification. There is aortic atherosclerosis, as well as atherosclerosis of the great vessels of the mediastinum and the coronary arteries, including calcified atherosclerotic plaque in the left main, left anterior descending, left circumflex and right coronary arteries. Severe calcifications of the aortic valve. Mediastinum/Nodes: No pathologically enlarged mediastinal or hilar lymph nodes. Please note that accurate exclusion of hilar adenopathy is limited on noncontrast CT scans. Esophagus is unremarkable in appearance. No axillary lymphadenopathy. Lungs/Pleura: 2 tiny pulmonary nodules are noted in the lungs bilaterally, the largest noncalcified nodule is in the left lower lobe (image 132 of series 3), with a volume derived mean diameter of only 2 mm. No other larger more suspicious appearing pulmonary nodules or masses are noted.  No acute consolidative airspace disease. No pleural effusions. Mild diffuse bronchial thickening with mild centrilobular and paraseptal emphysema. Upper Abdomen: Aortic atherosclerosis. Musculoskeletal: There are no aggressive appearing lytic or blastic lesions noted in the visualized portions of the skeleton. IMPRESSION: 1. Lung-RADS Category 2S, benign appearance or behavior. Continue annual screening with low-dose chest CT without contrast in 12 months. 2. The "S" modifier above refers to potentially clinically significant non lung cancer related findings. Specifically, Aortic atherosclerosis, in addition to left main and 3 vessel coronary artery disease. Please note that although the presence of coronary artery calcium documents the presence of coronary artery disease, the severity of this disease and any potential stenosis cannot be assessed on this non-gated CT examination. Assessment for potential risk factor modification, dietary therapy or pharmacologic therapy may be warranted, if clinically indicated. 3. There are calcifications of the aortic valve.  Echocardiographic correlation for evaluation of potential valvular dysfunction may be warranted if clinically indicated. 4. Mild diffuse bronchial wall thickening with mild centrilobular and paraseptal emphysema; imaging findings suggestive of underlying COPD. Electronically Signed   By: Vinnie Langton M.D.   On: 07/10/2016 09:57    Assessment & Plan:   There are no diagnoses linked to this encounter. I have discontinued Mr. Burnley Turmeric. I am also having him maintain his cholecalciferol, Folic Acid, pioglitazone, ONETOUCH DELICA LANCETS 09P, ONE TOUCH ULTRA TEST, Co Q-10, acetaminophen, MEGARED OMEGA-3 KRILL OIL, aspirin EC, amLODipine, pravastatin, traZODone, ALPRAZolam, Doxepin HCl, metFORMIN, doxepin, and carvedilol.  No orders of the defined types were placed in this encounter.    Follow-up: No Follow-up on file.  [name], MD

## 2016-07-31 NOTE — Telephone Encounter (Signed)
Rec'd fax pt requesting refill on his Doxepin. Per chart pt had OV this am already been approved and sent to [facility].../lmb

## 2016-07-31 NOTE — Assessment & Plan Note (Signed)
Pravastatin  

## 2016-07-31 NOTE — Assessment & Plan Note (Signed)
Doxepin

## 2016-07-31 NOTE — Assessment & Plan Note (Signed)
Reduce Pravachol if needed

## 2016-07-31 NOTE — Assessment & Plan Note (Signed)
Metfomin XR, on Actos

## 2016-08-17 IMAGING — NM NM MISC PROCEDURE
6 series · 36 of 36 positions shown · non-contrast
Comparison: none

[Series 1: wbr rest · 6.40mm/px · 6 of 64 frames shown]
[frame 6/64]
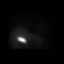
[frame 16/64]
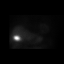
[frame 27/64]
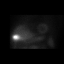
[frame 38/64]
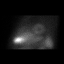
[frame 48/64]
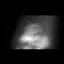
[frame 59/64]
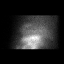

[Series 1: wbr_r-proj_st wbr rest · 6.40mm/px · 6 of 64 frames shown]
[frame 6/64]
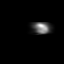
[frame 16/64]
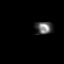
[frame 27/64]
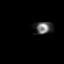
[frame 38/64]
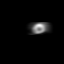
[frame 48/64]
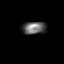
[frame 59/64]
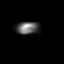

[Series 2: wbr_s-proj_st wbr stress-gsp · 6.40mm/px · 6 of 512 frames shown]
[frame 43/512]
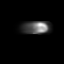
[frame 128/512]
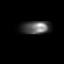
[frame 214/512]
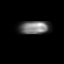
[frame 299/512]
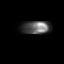
[frame 384/512]
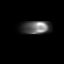
[frame 470/512]
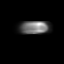

[Series 2: wbr stress-gsp · 6.40mm/px · 6 of 512 frames shown]
[frame 43/512]
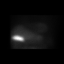
[frame 128/512]
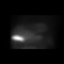
[frame 214/512]
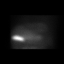
[frame 299/512]
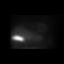
[frame 384/512]
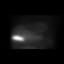
[frame 470/512]
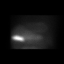

[Series 3: wbr stress-sum-em · 6.40mm/px · 6 of 64 frames shown]
[frame 6/64]
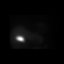
[frame 16/64]
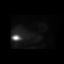
[frame 27/64]
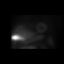
[frame 38/64]
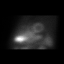
[frame 48/64]
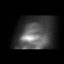
[frame 59/64]
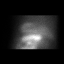

[Series 3: wbr_s-proj_st wbr stress-sum-em · 6.40mm/px · 6 of 64 frames shown]
[frame 6/64]
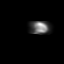
[frame 16/64]
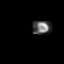
[frame 27/64]
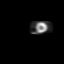
[frame 38/64]
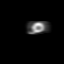
[frame 48/64]
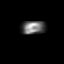
[frame 59/64]
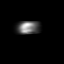

[36 of 36 positions shown; findings below may reference images not displayed]

Canned report from images found in remote index.

Refer to host system for actual result text.

## 2016-08-20 NOTE — Progress Notes (Signed)
Subjective:   [name]. is a 71 y.o. male who presents for Medicare Annual/Subsequent preventive examination.  Review of Systems:  No ROS.  Medicare Wellness Visit.  Cardiac Risk Factors include: family history of premature cardiovascular disease;diabetes mellitus;hypertension;advanced age (>7men, >63 women);male gender Sleep patterns: gets up 1 times nightly to void and sleeps 4-6 hours nightly.  Chronic insomnia issues, discussed recommended sleep tips  Home Safety/Smoke Alarms: Feels safe in home. Smoke alarms in place.    Living environment; residence and Firearm Safety: [place], firearms stored safely Lives with wife Seat Belt Safety/Bike Helmet: Wears seat belt.   Counseling:   Eye Exam- Routinely seen by Dr. [place] and retinal specialist Dr. [facility]- Every 6 months  Male:   CCS- Last 04/11/11, diverticula, recall 10 years    PSA-  Lab Results  Component Value Date   PSA 1.13 08/02/2015   PSA 1.03 03/22/2014   PSA 1.18 03/02/2013        Objective:    Vitals: BP 132/72   Pulse (!) 58   Resp 20   Ht 5\' 4"  (1.626 m)   Wt 190 lb (86.2 kg)   SpO2 97%   BMI 32.61 kg/m   Body mass index is 32.61 kg/m.  Tobacco History  Smoking Status  . Former Smoker  . Packs/day: 1.50  . Years: 40.00  . Types: Cigarettes  . Quit date: 08/13/2001  Smokeless Tobacco  . Never Used    Comment: Counseled to remain smoke free.     Counseling given: Not Answered   Past Medical History:  Diagnosis Date  . Asthma    mild, chronic cough  . Chronic kidney disease    kidney stone and infection- started 03-27-11  . Diabetes mellitus   . GERD (gastroesophageal reflux disease)   . Hyperlipidemia   . Hypertension    Past Surgical History:  Procedure Laterality Date  . APPENDECTOMY  1963  . CYSTOSCOPY W/ URETERAL STENT PLACEMENT  03/26/11   removal of kidney stone   Family History  Problem Relation Age of Onset  . Heart disease Father 76    MI  .  Diabetes Father   . Hypertension Father   . Multiple sclerosis Mother   . Diabetes Brother   . Hypertension Other   . Coronary artery disease Other   . Heart disease Sister 55    CAD  . Diabetes Sister   . Parkinsonism Brother   . Hypertension Brother   . Colon cancer Neg Hx   . Esophageal cancer Neg Hx   . Stomach cancer Neg Hx    History  Sexual Activity  . Sexual activity: Yes    Outpatient Encounter Prescriptions as of 08/21/2016  Medication Sig  . acetaminophen (TYLENOL) 650 MG CR tablet Take 650 mg by mouth 2 (two) times daily as needed for pain.   [name] ALPRAZolam (XANAX) 0.5 MG tablet TAKE 1 TO 2 TABLETS BY MOUTH AT BEDTIME AS NEEDED FOR ANXIETY OR SLEEP  . aspirin EC 81 MG tablet Take 81 mg by mouth daily.  . carvedilol (COREG) 25 MG tablet TAKE 1 TABLET BY MOUTH TWICE A DAY WITH MEALS  . Cholecalciferol (VITAMIN D3) 1000 UNITS tablet Take 2,000 Units by mouth daily.   . Coenzyme Q10 (CO Q-10) 200 MG CAPS Take 2 capsules by mouth daily.  [name] doxepin (SINEQUAN) 10 MG capsule Take 1-2 capsules (10-20 mg total) by mouth at bedtime as needed.  . Folic Acid 0.8  MG CAPS Take 2 capsules by mouth every morning.   [name] MEGARED OMEGA-3 KRILL OIL 500 MG CAPS Take 1 capsule by mouth every morning.  . metFORMIN (GLUCOPHAGE-XR) 750 MG 24 hr tablet TAKE 1 TABLET BY MOUTH EVERY DAY WITH BREAKFAST  . ONE TOUCH ULTRA TEST test strip TEST TWICE A DAY AS DIRECTED  . ONETOUCH DELICA LANCETS 70W MISC USE AS DIRECTED  . pravastatin (PRAVACHOL) 20 MG tablet Take 1 tablet (20 mg total) by mouth daily. (Patient taking differently: Take 10 mg by mouth daily. )  . traZODone (DESYREL) 50 MG tablet Take 1-2 tablets (50-100 mg total) by mouth at bedtime as needed for sleep.  [name] amLODipine (NORVASC) 10 MG tablet Take 1 tablet (10 mg total) by mouth daily before breakfast.  . pioglitazone (ACTOS) 45 MG tablet Take 22.5 mg by mouth every other day.  . [DISCONTINUED] Doxepin HCl 6 MG TABS 1 po qhs prn insomnia    No facility-administered encounter medications on file as of 08/21/2016.     Activities of Daily Living In your present state of health, do you have any difficulty performing the following activities: 08/21/2016  Hearing? N  Vision? N  Difficulty concentrating or making decisions? N  Walking or climbing stairs? N  Dressing or bathing? N  Doing errands, shopping? N  Preparing Food and eating ? N  Using the Toilet? N  In the past six months, have you accidently leaked urine? N  Do you have problems with loss of bowel control? N  Managing your Medications? N  Managing your Finances? N  Housekeeping or managing your Housekeeping? N  Some recent data might be hidden    Patient Care Team: Cassandria Anger, MD as PCP - General Sanda Klein, MD as Consulting Physician (Cardiology)   Assessment:    Physical assessment deferred to PCP.  Exercise Activities and Dietary recommendations Current Exercise Habits: Home exercise routine, Type of exercise: walking (outside work in the yard), Time (Minutes): 35, Frequency (Times/Week): 3, Weekly Exercise (Minutes/Week): 105, Intensity: Mild (mild to moderate), Exercise limited by: None identified  Diet (meal preparation, eat out, water intake, caffeinated beverages, dairy products, fruits and vegetables): in general, a "healthy" diet  , diabetic, low fat/ cholesterol, low salt drinks 2-3 cups of water daily, drinks 1/2 sweetened tea daily, limits soda and caffeine  Encouraged patient to continue to monitor carbohydrates, sugar, fat/ cholesterol, salt in diet and to increase water intake.    Goals    . lose 20 pounds           Start to exercise, be active, eat a healthy diet      Fall Risk Fall Risk  08/21/2016 04/10/2016 04/04/2015 11/17/2014  Falls in the past year? No No No No   Depression Screen PHQ 2/9 Scores 08/21/2016 04/10/2016 04/04/2015 11/17/2014  PHQ - 2 Score 0 0 0 0    Cognitive Function       Ad8 score reviewed for  issues:  Issues making decisions: no  Less interest in hobbies / activities: no  Repeats questions, stories (family complaining): no  Trouble using ordinary gadgets (microwave, computer, phone): no  Forgets the month or year: no  Mismanaging finances: no  Remembering appts: o  Daily problems with thinking and/or memory: no Ad8 score is= 0     Immunization History  Administered Date(s) Administered  . Influenza Split 02/21/2012  . Influenza Whole 02/25/2011  . Influenza, High Dose Seasonal PF 03/10/2013, 04/10/2016  . Influenza,inj,Quad PF,36+  Mos 03/28/2014, 04/04/2015  . Pneumococcal Conjugate-13 07/23/2013  . Pneumococcal Polysaccharide-23 11/13/2009  . Td 11/13/2009  . Zoster 11/20/2009   Screening Tests Health Maintenance  Topic Date Due  . Hepatitis C Screening  [birth year]  . URINE MICROALBUMIN  08/01/2016  . OPHTHALMOLOGY EXAM  01/24/2017 (Originally 01/26/2016)  . HEMOGLOBIN A1C  01/21/2017  . FOOT EXAM  07/31/2017  . TETANUS/TDAP  11/14/2019  . COLONOSCOPY  04/10/2021  . INFLUENZA VACCINE  Completed  . PNA vac Low Risk Adult  Completed      Plan:     Start to eat heart healthy diet (full of fruits, vegetables, whole grains, lean protein, water--limit salt, fat, and sugar intake) and increase physical activity as tolerated.  Continue doing brain stimulating activities (puzzles, reading, adult coloring books, staying active) to keep memory sharp.   Screening for Hep-C completed today  During the course of the visit the patient was educated and counseled about the following appropriate screening and preventive services:   Vaccines to include Pneumoccal, Influenza, Hepatitis B, Td, Zostavax, HCV  Cardiovascular Disease  Colorectal cancer screening  Diabetes screening  Prostate Cancer Screening  Glaucoma screening  Nutrition counseling   Patient Instructions (the written plan) was given to the patient.    Michiel Cowboy, RN  08/21/2016

## 2016-08-20 NOTE — Progress Notes (Signed)
Pre visit review using our clinic review tool, if applicable. No additional management support is needed unless otherwise documented below in the visit note. 

## 2016-08-21 ENCOUNTER — Other Ambulatory Visit (INDEPENDENT_AMBULATORY_CARE_PROVIDER_SITE_OTHER): Payer: PPO

## 2016-08-21 ENCOUNTER — Ambulatory Visit (INDEPENDENT_AMBULATORY_CARE_PROVIDER_SITE_OTHER): Payer: PPO | Admitting: *Deleted

## 2016-08-21 VITALS — BP 132/72 | HR 58 | Resp 20 | Ht 64.0 in | Wt 190.0 lb

## 2016-08-21 DIAGNOSIS — Z9189 Other specified personal risk factors, not elsewhere classified: Secondary | ICD-10-CM

## 2016-08-21 DIAGNOSIS — E119 Type 2 diabetes mellitus without complications: Secondary | ICD-10-CM | POA: Diagnosis not present

## 2016-08-21 DIAGNOSIS — Z0000 Encounter for general adult medical examination without abnormal findings: Secondary | ICD-10-CM

## 2016-08-21 DIAGNOSIS — Z1159 Encounter for screening for other viral diseases: Secondary | ICD-10-CM

## 2016-08-21 LAB — BASIC METABOLIC PANEL
BUN: 15 mg/dL (ref 6–23)
CHLORIDE: 101 meq/L (ref 96–112)
CO2: 27 mEq/L (ref 19–32)
CREATININE: 0.81 mg/dL (ref 0.40–1.50)
Calcium: 9.9 mg/dL (ref 8.4–10.5)
GFR: 99.48 mL/min (ref >60.00)
Glucose, Bld: 126 mg/dL — ABNORMAL HIGH (ref 70–99)
Potassium: 4.2 mEq/L (ref 3.5–5.1)
SODIUM: 136 meq/L (ref 135–145)

## 2016-08-21 LAB — HEMOGLOBIN A1C: HEMOGLOBIN A1C: 6.9 % — AB (ref 4.6–6.5)

## 2016-08-21 NOTE — Patient Instructions (Signed)
  Start to eat heart healthy diet (full of fruits, vegetables, whole grains, lean protein, water--limit salt, fat, and sugar intake) and increase physical activity as tolerated.  Continue doing brain stimulating activities (puzzles, reading, adult coloring books, staying active) to keep memory sharp.    [name] , Thank you for taking time to come for your Medicare Wellness Visit. I appreciate your ongoing commitment to your health goals. Please review the following plan we discussed and let me know if I can assist you in the future.   These are the goals we discussed: Goals    . lose 20 pounds           Start to exercise, be active, eat a healthy diet       This is a list of the screening recommended for you and due dates:  Health Maintenance  Topic Date Due  .  Hepatitis C: One time screening is recommended by Center for Disease Control  (CDC) for  adults born from [birth year] through 1967.   06/16/1945  . Eye exam for diabetics  01/26/2016  . Urine Protein Check  08/01/2016  . Hemoglobin A1C  01/21/2017  . Complete foot exam   07/31/2017  . Tetanus Vaccine  11/14/2019  . Colon Cancer Screening  04/10/2021  . Flu Shot  Completed  . Pneumonia vaccines  Completed

## 2016-08-22 ENCOUNTER — Encounter: Payer: Self-pay | Admitting: Internal Medicine

## 2016-08-22 LAB — HEPATITIS C ANTIBODY: HCV AB: NEGATIVE

## 2016-09-17 ENCOUNTER — Ambulatory Visit (INDEPENDENT_AMBULATORY_CARE_PROVIDER_SITE_OTHER): Payer: PPO | Admitting: Cardiovascular Disease

## 2016-09-17 ENCOUNTER — Encounter: Payer: Self-pay | Admitting: Cardiovascular Disease

## 2016-09-17 VITALS — BP 102/70 | HR 59 | Ht 64.0 in | Wt 184.2 lb

## 2016-09-17 DIAGNOSIS — E785 Hyperlipidemia, unspecified: Secondary | ICD-10-CM | POA: Diagnosis not present

## 2016-09-17 DIAGNOSIS — I10 Essential (primary) hypertension: Secondary | ICD-10-CM

## 2016-09-17 DIAGNOSIS — I2510 Atherosclerotic heart disease of native coronary artery without angina pectoris: Secondary | ICD-10-CM | POA: Diagnosis not present

## 2016-09-17 DIAGNOSIS — E119 Type 2 diabetes mellitus without complications: Secondary | ICD-10-CM | POA: Diagnosis not present

## 2016-09-17 NOTE — Progress Notes (Addendum)
Patient ID: [name]., male   DOB: April 10, 1946, 71 y.o.   MRN: [id number]    Cardiology Office Note    Date:  09/17/2016   ID:  [name]., DOB 02-07-1946, MRN [id number]  PCP:  [name], MD  Cardiologist:   Sanda Klein, MD   Chief Complaint  Patient presents with  . Follow-up    History of Present Illness:  [name]. is a 71 y.o. male with cardiac murmur and incidentally discovered coronary artery calcifications on CT of the chest and multiple coronary risk factors that include type 2 diabetes mellitus, hyperlipidemia and hypertension.  He returns for follow-up on multiple noninvasive studies. His echocardiogram shows aortic valve sclerosis without significant stenosis. His treadmill stress test was nondiagnostic due to inadequate heart rate and a follow-up Lexiscan Myoview showed inferior diaphragmatic attenuation artifact. Ejection fraction was normal on the echocardiogram and calculated borderline at 54% on the nuclear scintigram. Both studies showed normal regional wall motion.  The patient specifically denies any chest pain at rest exertion, dyspnea at rest or with exertion, orthopnea, paroxysmal nocturnal dyspnea, syncope, palpitations, focal neurological deficits, intermittent claudication, lower extremity edema, unexplained weight gain, cough, hemoptysis or wheezing.  He continues to be physically very active. He has a remote history of cough syncope, none recently. His diabetes, hypertension, cholesterol levels are all very well controlled with diet/exercise and pharmaceuticals. He has a strong family history of premature coronary disease leading to death.  He is intolerant to both atorvastatin and rosuvastatin, but has had a remarkably successful reduction in LDL cholesterol on a very low-dose of pravastatin. He only takes the medication twice a week and still has an LDL cholesterol in target range at 84 mg/deciliter.  Past Medical History:    Diagnosis Date  . Asthma    mild, chronic cough  . Chronic kidney disease    kidney stone and infection- started 03-27-11  . Diabetes mellitus   . GERD (gastroesophageal reflux disease)   . Hyperlipidemia   . Hypertension     Past Surgical History:  Procedure Laterality Date  . APPENDECTOMY  1963  . CYSTOSCOPY W/ URETERAL STENT PLACEMENT  03/26/11   removal of kidney stone    Current Medications: Outpatient Medications Prior to Visit  Medication Sig Dispense Refill  . acetaminophen (TYLENOL) 650 MG CR tablet Take 650 mg by mouth 2 (two) times daily as needed for pain.     [name] ALPRAZolam (XANAX) 0.5 MG tablet TAKE 1 TO 2 TABLETS BY MOUTH AT BEDTIME AS NEEDED FOR ANXIETY OR SLEEP 180 tablet 1  . aspirin EC 81 MG tablet Take 81 mg by mouth daily.    . carvedilol (COREG) 25 MG tablet TAKE 1 TABLET BY MOUTH TWICE A DAY WITH MEALS 180 tablet 1  . Cholecalciferol (VITAMIN D3) 1000 UNITS tablet Take 2,000 Units by mouth daily.     . Coenzyme Q10 (CO Q-10) 200 MG CAPS Take 400 mg by mouth daily.     [name] doxepin (SINEQUAN) 10 MG capsule Take 1-2 capsules (10-20 mg total) by mouth at bedtime as needed. 180 capsule 2  . Folic Acid 0.8 MG CAPS Take 2 capsules by mouth every morning.  30 each 0  . MEGARED OMEGA-3 KRILL OIL 500 MG CAPS Take 1 capsule by mouth every morning. 100 capsule 3  . metFORMIN (GLUCOPHAGE-XR) 750 MG 24 hr tablet TAKE 1 TABLET BY MOUTH EVERY DAY WITH BREAKFAST 90 tablet 1  . ONE TOUCH  ULTRA TEST test strip TEST TWICE A DAY AS DIRECTED 100 each 3  . ONETOUCH DELICA LANCETS 05L MISC USE AS DIRECTED 100 each 3  . pravastatin (PRAVACHOL) 20 MG tablet Take 1 tablet (20 mg total) by mouth daily. (Patient taking differently: Take 10 mg by mouth 2 (two) times a week. ) 90 tablet 11  . traZODone (DESYREL) 50 MG tablet Take 1-2 tablets (50-100 mg total) by mouth at bedtime as needed for sleep. 180 tablet 1  . amLODipine (NORVASC) 10 MG tablet Take 1 tablet (10 mg total) by mouth daily  before breakfast. (Patient taking differently: Take 5 mg by mouth daily before breakfast. ) 90 tablet 3  . pioglitazone (ACTOS) 45 MG tablet Take 22.5 mg by mouth every other day.     No facility-administered medications prior to visit.      Allergies:   Olmesartan and Symbicort [budesonide-formoterol fumarate]   Social History   Social History  . Marital status: Married    Spouse name: N/A  . Number of children: 3  . Years of education: N/A   Occupational History  . Professional    Social History Main Topics  . Smoking status: Former Smoker    Packs/day: 1.50    Years: 40.00    Types: Cigarettes    Quit date: 08/13/2001  . Smokeless tobacco: Never Used     Comment: Counseled to remain smoke free.  . Alcohol use 6.0 oz/week    10 Glasses of wine per week  . Drug use: No  . Sexual activity: Yes   Other Topics Concern  . None   Social History Narrative   Regular Exercise -  YES; riding           Family History:  The patient's family history includes Coronary artery disease in his other; Diabetes in his brother, father, and sister; Heart disease (age of onset: 45) in his father; Heart disease (age of onset: 6) in his sister; Hypertension in his brother, father, and other; Multiple sclerosis in his mother; Parkinsonism in his brother.   ROS:   Please see the history of present illness.    ROS All other systems reviewed and are negative.   PHYSICAL EXAM:   VS:  BP 102/70   Pulse (!) 59   Ht 5\' 4"  (1.626 m)   Wt 83.6 kg (184 lb 3.2 oz)   BMI 31.62 kg/m    GEN: Well nourished, well developed, in no acute distress  HEENT: normal  Neck: no JVD, carotid bruits, or masses Cardiac: RRR, 1-2/6 early peaking systolic ejection murmur in the aortic focus, no diastolic murmurs, rubs, or gallops,no edema  Respiratory:  clear to auscultation bilaterally, normal work of breathing GI: soft, nontender, nondistended, + BS MS: no deformity or atrophy  Skin: warm and dry, no  rash Neuro:  Alert and Oriented x 3, Strength and sensation are intact Psych: euthymic mood, full affect  Wt Readings from Last 3 Encounters:  09/17/16 83.6 kg (184 lb 3.2 oz)  08/21/16 86.2 kg (190 lb)  07/31/16 86.1 kg (189 lb 12 oz)      Studies/Labs Reviewed:   EKG:  EKG is ordered today. It shows normal sinus rhythm, normal tracing  Recent Labs: 04/03/2016: ALT 16 08/21/2016: BUN 15; Creatinine, Ser 0.81; Potassium 4.2; Sodium 136   Lipid Panel    Component Value Date/Time   CHOL 193 04/03/2016 0800   TRIG 92.0 04/03/2016 0800   HDL 90.60 04/03/2016 0800   CHOLHDL  2 04/03/2016 0800   VLDL 18.4 04/03/2016 0800   LDLCALC 84 04/03/2016 0800     ASSESSMENT:    1. Coronary artery calcification seen on CT scan   2. Essential hypertension   3. Controlled type 2 diabetes mellitus without complication, without long-term current use of insulin ([place])   4. Dyslipidemia      PLAN:  In order of problems listed above:  1. Focus on risk factor treatment aggressively. No need for further evaluation or cardiac specific therapy at this time. Follow-up as needed. 2. Excellent blood pressure control 3. Good glycemic control, A1c consistently under 7% 4. Target LDL cholesterol definitely less than 100, preferably less than 70. Intolerance to potent statins, but reasonably good result on pravastatin. Note excellent HDL.In the absence of clear-cut complications of vascular disease, I do not recommend PCS K9 inhibitors.    Medication Adjustments/Labs and Tests Ordered: Current medicines are reviewed at length with the patient today.  Concerns regarding medicines are outlined above.  Medication changes, Labs and Tests ordered today are listed in the Patient Instructions below. Patient Instructions  Dr Sallyanne Kuster recommends that you follow-up with him as needed.    Signed, Sanda Klein, MD  09/17/2016 9:33 AM    [facility] [place], [place], [place]   [address] Phone: [phone]; Fax: [phone]

## 2016-09-17 NOTE — Patient Instructions (Signed)
Dr Croitoru recommends that you follow-up with him as needed. 

## 2016-10-04 ENCOUNTER — Other Ambulatory Visit: Payer: Self-pay | Admitting: Internal Medicine

## 2016-10-19 ENCOUNTER — Other Ambulatory Visit: Payer: Self-pay | Admitting: Internal Medicine

## 2016-10-27 ENCOUNTER — Other Ambulatory Visit: Payer: Self-pay | Admitting: Internal Medicine

## 2016-12-03 ENCOUNTER — Other Ambulatory Visit (INDEPENDENT_AMBULATORY_CARE_PROVIDER_SITE_OTHER): Payer: PPO

## 2016-12-03 DIAGNOSIS — E119 Type 2 diabetes mellitus without complications: Secondary | ICD-10-CM

## 2016-12-03 LAB — BASIC METABOLIC PANEL
BUN: 16 mg/dL (ref 6–23)
CALCIUM: 9.3 mg/dL (ref 8.4–10.5)
CO2: 27 mEq/L (ref 19–32)
Chloride: 102 mEq/L (ref 96–112)
Creatinine, Ser: 0.86 mg/dL (ref 0.40–1.50)
GFR: 92.76 mL/min (ref >60.00)
Glucose, Bld: 123 mg/dL — ABNORMAL HIGH (ref 70–99)
Potassium: 4 mEq/L (ref 3.5–5.1)
SODIUM: 138 meq/L (ref 135–145)

## 2016-12-03 LAB — HEMOGLOBIN A1C: Hgb A1c MFr Bld: 6.7 % — ABNORMAL HIGH (ref 4.6–6.5)

## 2016-12-11 ENCOUNTER — Encounter: Payer: Self-pay | Admitting: Internal Medicine

## 2016-12-11 ENCOUNTER — Ambulatory Visit (INDEPENDENT_AMBULATORY_CARE_PROVIDER_SITE_OTHER): Payer: PPO | Admitting: Internal Medicine

## 2016-12-11 VITALS — BP 112/74 | HR 58 | Temp 98.1°F | Ht 64.0 in | Wt 187.0 lb

## 2016-12-11 DIAGNOSIS — Z0000 Encounter for general adult medical examination without abnormal findings: Secondary | ICD-10-CM | POA: Diagnosis not present

## 2016-12-11 DIAGNOSIS — N320 Bladder-neck obstruction: Secondary | ICD-10-CM | POA: Diagnosis not present

## 2016-12-11 DIAGNOSIS — I10 Essential (primary) hypertension: Secondary | ICD-10-CM

## 2016-12-11 DIAGNOSIS — E785 Hyperlipidemia, unspecified: Secondary | ICD-10-CM | POA: Diagnosis not present

## 2016-12-11 DIAGNOSIS — E119 Type 2 diabetes mellitus without complications: Secondary | ICD-10-CM

## 2016-12-11 MED ORDER — ZOSTER VAC RECOMB ADJUVANTED 50 MCG/0.5ML IM SUSR
0.5000 mL | Freq: Once | INTRAMUSCULAR | 1 refills | Status: AC
Start: 2016-12-11 — End: 2016-12-11

## 2016-12-11 NOTE — Progress Notes (Signed)
Subjective:  Patient ID: Lars Mage., male    DOB: 1946/03/07  Age: 71 y.o. MRN: [id number]  CC: No chief complaint on file.   HPI Dorin Stooksbury. presents for DM, HTN, CAD f/u  Outpatient Medications Prior to Visit  Medication Sig Dispense Refill  . acetaminophen (TYLENOL) 650 MG CR tablet Take 650 mg by mouth 2 (two) times daily as needed for pain.     [name] ALPRAZolam (XANAX) 0.5 MG tablet TAKE 1 TO 2 TABLETS BY MOUTH AT BEDTIME AS NEEDED FOR ANXIETY OR SLEEP 180 tablet 1  . aspirin EC 81 MG tablet Take 81 mg by mouth daily.    . carvedilol (COREG) 25 MG tablet TAKE 1 TABLET BY MOUTH TWICE A DAY WITH MEALS 180 tablet 1  . Cholecalciferol (VITAMIN D3) 1000 UNITS tablet Take 2,000 Units by mouth daily.     . Coenzyme Q10 (CO Q-10) 200 MG CAPS Take 400 mg by mouth daily.     [name] doxepin (SINEQUAN) 10 MG capsule Take 1-2 capsules (10-20 mg total) by mouth at bedtime as needed. 180 capsule 2  . Folic Acid 0.8 MG CAPS Take 2 capsules by mouth every morning.  30 each 0  . metFORMIN (GLUCOPHAGE-XR) 750 MG 24 hr tablet TAKE 1 TABLET BY MOUTH EVERY DAY WITH BREAKFAST 90 tablet 1  . ONE TOUCH ULTRA TEST test strip TEST TWICE A DAY AS DIRECTED 100 each 3  . ONETOUCH DELICA LANCETS 18E MISC USE AS DIRECTED 100 each 3  . pioglitazone (ACTOS) 45 MG tablet TAKE 1 TABLET (45 MG TOTAL) BY MOUTH EVERY OTHER DAY. 90 tablet 2  . traMADol (ULTRAM) 50 MG tablet Take by mouth every 6 (six) hours as needed.    . traZODone (DESYREL) 50 MG tablet Take 1-2 tablets (50-100 mg total) by mouth at bedtime as needed for sleep. 180 tablet 1  . amLODipine (NORVASC) 10 MG tablet Take 1 tablet (10 mg total) by mouth daily before breakfast. (Patient taking differently: Take 5 mg by mouth daily before breakfast. ) 90 tablet 3  . MEGARED OMEGA-3 KRILL OIL 500 MG CAPS Take 1 capsule by mouth every morning. 100 capsule 3  . pioglitazone (ACTOS) 45 MG tablet Take 22.5 mg by mouth every other day.    . pravastatin  (PRAVACHOL) 20 MG tablet Take 1 tablet (20 mg total) by mouth daily. (Patient taking differently: Take 10 mg by mouth 2 (two) times a week. ) 90 tablet 11   No facility-administered medications prior to visit.     ROS Review of Systems  Constitutional: Negative for appetite change, fatigue and unexpected weight change.  HENT: Negative for congestion, nosebleeds, sneezing, sore throat and trouble swallowing.   Eyes: Negative for itching and visual disturbance.  Respiratory: Negative for cough.   Cardiovascular: Negative for chest pain, palpitations and leg swelling.  Gastrointestinal: Negative for abdominal distention, blood in stool, diarrhea and nausea.  Genitourinary: Negative for frequency and hematuria.  Musculoskeletal: Negative for back pain, gait problem, joint swelling and neck pain.  Skin: Negative for rash.  Neurological: Negative for dizziness, tremors, speech difficulty and weakness.  Psychiatric/Behavioral: Negative for agitation, dysphoric mood and sleep disturbance. The patient is not nervous/anxious.     Objective:  BP 112/74 (BP Location: Left Arm, Patient Position: Sitting, Cuff Size: Normal)   Pulse (!) 58   Temp 98.1 F (36.7 C) (Oral)   Ht 5\' 4"  (1.626 m)   Wt 187 lb (84.8 kg)  SpO2 98%   BMI 32.10 kg/m   BP Readings from Last 3 Encounters:  12/11/16 112/74  09/17/16 102/70  08/21/16 132/72    Wt Readings from Last 3 Encounters:  12/11/16 187 lb (84.8 kg)  09/17/16 184 lb 3.2 oz (83.6 kg)  08/21/16 190 lb (86.2 kg)    Physical Exam  Constitutional: He is oriented to person, place, and time. He appears well-developed. No distress.  NAD  HENT:  Mouth/Throat: Oropharynx is clear and moist.  Eyes: Pupils are equal, round, and reactive to light. Conjunctivae are normal.  Neck: Normal range of motion. No JVD present. No thyromegaly present.  Cardiovascular: Normal rate, regular rhythm, normal heart sounds and intact distal pulses.  Exam reveals no  gallop and no friction rub.   No murmur heard. Pulmonary/Chest: Effort normal and breath sounds normal. No respiratory distress. He has no wheezes. He has no rales. He exhibits no tenderness.  Abdominal: Soft. Bowel sounds are normal. He exhibits no distension and no mass. There is no tenderness. There is no rebound and no guarding.  Musculoskeletal: Normal range of motion. He exhibits no edema or tenderness.  Lymphadenopathy:    He has no cervical adenopathy.  Neurological: He is alert and oriented to person, place, and time. He has normal reflexes. No cranial nerve deficit. He exhibits normal muscle tone. He displays a negative Romberg sign. Coordination and gait normal.  Skin: Skin is warm and dry. No rash noted.  Psychiatric: He has a normal mood and affect. His behavior is normal. Judgment and thought content normal.    Lab Results  Component Value Date   WBC 6.4 08/02/2015   HGB 15.8 08/02/2015   HCT 44.5 08/02/2015   PLT 212.0 08/02/2015   GLUCOSE 123 (H) 12/03/2016   CHOL 193 04/03/2016   TRIG 92.0 04/03/2016   HDL 90.60 04/03/2016   LDLCALC 84 04/03/2016   ALT 16 04/03/2016   AST 17 04/03/2016   NA 138 12/03/2016   K 4.0 12/03/2016   CL 102 12/03/2016   CREATININE 0.86 12/03/2016   BUN 16 12/03/2016   CO2 27 12/03/2016   TSH 1.69 08/02/2015   PSA 1.13 08/02/2015   HGBA1C 6.7 (H) 12/03/2016   MICROALBUR 1.5 08/02/2015    Ct Chest Lung Ca Screen Low Dose W/o Cm  Result Date: 07/10/2016 CLINICAL DATA:  71 year old male former smoker (quit 15 years ago) with 60 pack-year history of smoking. Lung cancer screening examination. EXAM: CT CHEST WITHOUT CONTRAST LOW-DOSE FOR LUNG CANCER SCREENING TECHNIQUE: Multidetector CT imaging of the chest was performed following the standard protocol without IV contrast. COMPARISON:  Low-dose lung cancer screening chest CT 06/20/2015. FINDINGS: Cardiovascular: Heart size is normal. There is no significant pericardial fluid, thickening or  pericardial calcification. There is aortic atherosclerosis, as well as atherosclerosis of the great vessels of the mediastinum and the coronary arteries, including calcified atherosclerotic plaque in the left main, left anterior descending, left circumflex and right coronary arteries. Severe calcifications of the aortic valve. Mediastinum/Nodes: No pathologically enlarged mediastinal or hilar lymph nodes. Please note that accurate exclusion of hilar adenopathy is limited on noncontrast CT scans. Esophagus is unremarkable in appearance. No axillary lymphadenopathy. Lungs/Pleura: 2 tiny pulmonary nodules are noted in the lungs bilaterally, the largest noncalcified nodule is in the left lower lobe (image 132 of series 3), with a volume derived mean diameter of only 2 mm. No other larger more suspicious appearing pulmonary nodules or masses are noted. No acute consolidative airspace disease.  No pleural effusions. Mild diffuse bronchial thickening with mild centrilobular and paraseptal emphysema. Upper Abdomen: Aortic atherosclerosis. Musculoskeletal: There are no aggressive appearing lytic or blastic lesions noted in the visualized portions of the skeleton. IMPRESSION: 1. Lung-RADS Category 2S, benign appearance or behavior. Continue annual screening with low-dose chest CT without contrast in 12 months. 2. The "S" modifier above refers to potentially clinically significant non lung cancer related findings. Specifically, Aortic atherosclerosis, in addition to left main and 3 vessel coronary artery disease. Please note that although the presence of coronary artery calcium documents the presence of coronary artery disease, the severity of this disease and any potential stenosis cannot be assessed on this non-gated CT examination. Assessment for potential risk factor modification, dietary therapy or pharmacologic therapy may be warranted, if clinically indicated. 3. There are calcifications of the aortic valve.  Echocardiographic correlation for evaluation of potential valvular dysfunction may be warranted if clinically indicated. 4. Mild diffuse bronchial wall thickening with mild centrilobular and paraseptal emphysema; imaging findings suggestive of underlying COPD. Electronically Signed   By: Vinnie Langton M.D.   On: 07/10/2016 09:57    Assessment & Plan:   There are no diagnoses linked to this encounter. I have discontinued Mr. Requena MEGARED OMEGA-3 KRILL OIL. I am also having him maintain his cholecalciferol, Folic Acid, ONETOUCH DELICA LANCETS 93O, ONE TOUCH ULTRA TEST, Co Q-10, acetaminophen, aspirin EC, amLODipine, traZODone, ALPRAZolam, doxepin, traMADol, pioglitazone, metFORMIN, carvedilol, and pravastatin.  Meds ordered this encounter  Medications  . pravastatin (PRAVACHOL) 20 MG tablet    Sig: Take 10 mg by mouth once a week.     Follow-up: No Follow-up on file.  [name], MD

## 2016-12-11 NOTE — Assessment & Plan Note (Signed)
Metformin, Actos

## 2016-12-11 NOTE — Assessment & Plan Note (Signed)
Pravastatin Labs 

## 2016-12-11 NOTE — Assessment & Plan Note (Signed)
Coreg, Norvasc

## 2017-01-08 ENCOUNTER — Other Ambulatory Visit: Payer: Self-pay | Admitting: Internal Medicine

## 2017-01-10 NOTE — Telephone Encounter (Signed)
Daughter calling in regard to medication refill.  Would like sent as soon as possible.

## 2017-01-14 DIAGNOSIS — E119 Type 2 diabetes mellitus without complications: Secondary | ICD-10-CM | POA: Diagnosis not present

## 2017-01-14 DIAGNOSIS — H43823 Vitreomacular adhesion, bilateral: Secondary | ICD-10-CM | POA: Diagnosis not present

## 2017-01-14 DIAGNOSIS — H2513 Age-related nuclear cataract, bilateral: Secondary | ICD-10-CM | POA: Diagnosis not present

## 2017-02-19 ENCOUNTER — Ambulatory Visit (INDEPENDENT_AMBULATORY_CARE_PROVIDER_SITE_OTHER): Payer: PPO

## 2017-02-19 DIAGNOSIS — Z23 Encounter for immunization: Secondary | ICD-10-CM

## 2017-04-08 ENCOUNTER — Other Ambulatory Visit (INDEPENDENT_AMBULATORY_CARE_PROVIDER_SITE_OTHER): Payer: PPO

## 2017-04-08 DIAGNOSIS — E785 Hyperlipidemia, unspecified: Secondary | ICD-10-CM | POA: Diagnosis not present

## 2017-04-08 DIAGNOSIS — N320 Bladder-neck obstruction: Secondary | ICD-10-CM | POA: Diagnosis not present

## 2017-04-08 DIAGNOSIS — I10 Essential (primary) hypertension: Secondary | ICD-10-CM | POA: Diagnosis not present

## 2017-04-08 DIAGNOSIS — E119 Type 2 diabetes mellitus without complications: Secondary | ICD-10-CM

## 2017-04-08 DIAGNOSIS — Z0000 Encounter for general adult medical examination without abnormal findings: Secondary | ICD-10-CM | POA: Diagnosis not present

## 2017-04-08 LAB — CBC WITH DIFFERENTIAL/PLATELET
BASOS ABS: 0 10*3/uL (ref 0.0–0.1)
Basophils Relative: 0.8 % (ref 0.0–3.0)
Eosinophils Absolute: 0.3 10*3/uL (ref 0.0–0.7)
Eosinophils Relative: 5.5 % — ABNORMAL HIGH (ref 0.0–5.0)
HCT: 51 % (ref 39.0–52.0)
Hemoglobin: 17.2 g/dL — ABNORMAL HIGH (ref 13.0–17.0)
LYMPHS ABS: 1.6 10*3/uL (ref 0.7–4.0)
Lymphocytes Relative: 28.3 % (ref 12.0–46.0)
MCHC: 33.7 g/dL (ref 30.0–36.0)
MCV: 92.4 fl (ref 78.0–100.0)
MONO ABS: 0.8 10*3/uL (ref 0.1–1.0)
Monocytes Relative: 14.2 % — ABNORMAL HIGH (ref 3.0–12.0)
NEUTROS PCT: 51.2 % (ref 43.0–77.0)
Neutro Abs: 2.9 10*3/uL (ref 1.4–7.7)
PLATELETS: 212 10*3/uL (ref 150.0–400.0)
RBC: 5.52 Mil/uL (ref 4.22–5.81)
RDW: 13.6 % (ref 11.5–15.5)
WBC: 5.6 10*3/uL (ref 4.0–10.5)

## 2017-04-08 LAB — LIPID PANEL
CHOLESTEROL: 218 mg/dL — AB (ref 0–200)
HDL: 91.7 mg/dL (ref >39.00)
LDL Cholesterol: 111 mg/dL — ABNORMAL HIGH (ref 0–99)
NONHDL: 126.56
Total CHOL/HDL Ratio: 2
Triglycerides: 79 mg/dL (ref 0.0–149.0)
VLDL: 15.8 mg/dL (ref 0.0–40.0)

## 2017-04-08 LAB — URINALYSIS
BILIRUBIN URINE: NEGATIVE
Ketones, ur: NEGATIVE
LEUKOCYTES UA: NEGATIVE
NITRITE: NEGATIVE
Specific Gravity, Urine: 1.025 (ref 1.000–1.030)
TOTAL PROTEIN, URINE-UPE24: NEGATIVE
URINE GLUCOSE: NEGATIVE
Urobilinogen, UA: 0.2 (ref 0.0–1.0)
pH: 6 (ref 5.0–8.0)

## 2017-04-08 LAB — COMPREHENSIVE METABOLIC PANEL
ALBUMIN: 4.1 g/dL (ref 3.5–5.2)
ALT: 17 U/L (ref 0–53)
AST: 17 U/L (ref 0–37)
Alkaline Phosphatase: 72 U/L (ref 39–117)
BUN: 15 mg/dL (ref 6–23)
CALCIUM: 9.6 mg/dL (ref 8.4–10.5)
CO2: 29 mEq/L (ref 19–32)
CREATININE: 0.94 mg/dL (ref 0.40–1.50)
Chloride: 101 mEq/L (ref 96–112)
GFR: 83.63 mL/min (ref >60.00)
Glucose, Bld: 129 mg/dL — ABNORMAL HIGH (ref 70–99)
POTASSIUM: 4.5 meq/L (ref 3.5–5.1)
SODIUM: 139 meq/L (ref 135–145)
TOTAL PROTEIN: 6.8 g/dL (ref 6.0–8.3)
Total Bilirubin: 0.6 mg/dL (ref 0.2–1.2)

## 2017-04-08 LAB — PSA: PSA: 1.25 ng/mL (ref 0.10–4.00)

## 2017-04-08 LAB — TSH: TSH: 1.95 u[IU]/mL (ref 0.35–4.50)

## 2017-04-15 ENCOUNTER — Ambulatory Visit: Payer: PPO | Admitting: Internal Medicine

## 2017-04-15 ENCOUNTER — Encounter: Payer: Self-pay | Admitting: Internal Medicine

## 2017-04-15 VITALS — BP 124/72 | HR 53 | Temp 97.7°F | Ht 64.0 in | Wt 189.0 lb

## 2017-04-15 DIAGNOSIS — I2584 Coronary atherosclerosis due to calcified coronary lesion: Secondary | ICD-10-CM | POA: Diagnosis not present

## 2017-04-15 DIAGNOSIS — I2510 Atherosclerotic heart disease of native coronary artery without angina pectoris: Secondary | ICD-10-CM

## 2017-04-15 DIAGNOSIS — D751 Secondary polycythemia: Secondary | ICD-10-CM

## 2017-04-15 DIAGNOSIS — L570 Actinic keratosis: Secondary | ICD-10-CM | POA: Diagnosis not present

## 2017-04-15 DIAGNOSIS — E119 Type 2 diabetes mellitus without complications: Secondary | ICD-10-CM

## 2017-04-15 LAB — POCT GLYCOSYLATED HEMOGLOBIN (HGB A1C): Hemoglobin A1C: 6.4

## 2017-04-15 MED ORDER — ONETOUCH DELICA LANCETS 33G MISC: 3 refills | Status: DC

## 2017-04-15 MED ORDER — GLUCOSE BLOOD VI STRP: ORAL_STRIP | 3 refills | Status: DC

## 2017-04-15 NOTE — Progress Notes (Signed)
Subjective:  Patient ID: Chase Mage., male    DOB: [birth year]  Age: 71 y.o. MRN: [id number]  CC: No chief complaint on file.   HPI [name]. presents for awell exam. F/u DM, HTN. C/o a sore on the scalp  Outpatient Medications Prior to Visit  Medication Sig Dispense Refill  . acetaminophen (TYLENOL) 650 MG CR tablet Take 650 mg by mouth 2 (two) times daily as needed for pain.     [name] ALPRAZolam (XANAX) 0.5 MG tablet TAKE 1 TO 2 TABLETS BY MOUTH AT BEDTIME AS NEEDED FOR ANXIETY OR SLEEP 180 tablet 1  . amLODipine (NORVASC) 10 MG tablet Take 1 tablet (10 mg total) by mouth daily before breakfast. (Patient taking differently: Take 5 mg by mouth daily before breakfast. ) 90 tablet 3  . aspirin EC 81 MG tablet Take 81 mg by mouth daily.    . carvedilol (COREG) 25 MG tablet TAKE 1 TABLET BY MOUTH TWICE A DAY WITH MEALS 180 tablet 1  . Cholecalciferol (VITAMIN D3) 1000 UNITS tablet Take 2,000 Units by mouth daily.     . Coenzyme Q10 (CO Q-10) 200 MG CAPS Take 400 mg by mouth daily.     [name] doxepin (SINEQUAN) 10 MG capsule Take 1-2 capsules (10-20 mg total) by mouth at bedtime as needed. 180 capsule 2  . Folic Acid 0.8 MG CAPS Take 2 capsules by mouth every morning.  30 each 0  . metFORMIN (GLUCOPHAGE-XR) 750 MG 24 hr tablet TAKE 1 TABLET BY MOUTH EVERY DAY WITH BREAKFAST 90 tablet 1  . ONE TOUCH ULTRA TEST test strip TEST TWICE A DAY AS DIRECTED 100 each 3  . ONETOUCH DELICA LANCETS 49F MISC USE AS DIRECTED 100 each 3  . pioglitazone (ACTOS) 45 MG tablet TAKE 1 TABLET (45 MG TOTAL) BY MOUTH EVERY OTHER DAY. (Patient taking differently: Take 45 mg every other day by mouth. 1/2 tablet every other day) 90 tablet 2  . pravastatin (PRAVACHOL) 20 MG tablet Take 10 mg by mouth once a week.    . traMADol (ULTRAM) 50 MG tablet Take by mouth every 6 (six) hours as needed.    . traZODone (DESYREL) 50 MG tablet Take 1-2 tablets (50-100 mg total) by mouth at bedtime as needed for sleep. 180  tablet 1  . amLODipine (NORVASC) 10 MG tablet TAKE 1 TABLET (10 MG TOTAL) BY MOUTH DAILY BEFORE BREAKFAST. 90 tablet 1   No facility-administered medications prior to visit.     ROS Review of Systems  Constitutional: Negative for appetite change, fatigue and unexpected weight change.  HENT: Negative for congestion, nosebleeds, sneezing, sore throat and trouble swallowing.   Eyes: Negative for itching and visual disturbance.  Respiratory: Negative for cough.   Cardiovascular: Negative for chest pain, palpitations and leg swelling.  Gastrointestinal: Negative for abdominal distention, blood in stool, diarrhea and nausea.  Genitourinary: Negative for frequency and hematuria.  Musculoskeletal: Negative for back pain, gait problem, joint swelling and neck pain.  Skin: Negative for rash.  Neurological: Negative for dizziness, tremors, speech difficulty and weakness.  Psychiatric/Behavioral: Negative for agitation, dysphoric mood and sleep disturbance. The patient is not nervous/anxious.     Objective:  BP 124/72 (BP Location: Left Arm, Patient Position: Sitting, Cuff Size: Large)   Pulse (!) 53   Temp 97.7 F (36.5 C) (Oral)   Ht 5\' 4"  (1.626 m)   Wt 189 lb (85.7 kg)   SpO2 96%   BMI 32.44 kg/m  BP Readings from Last 3 Encounters:  04/15/17 124/72  12/11/16 112/74  09/17/16 102/70    Wt Readings from Last 3 Encounters:  04/15/17 189 lb (85.7 kg)  12/11/16 187 lb (84.8 kg)  09/17/16 184 lb 3.2 oz (83.6 kg)    Physical Exam  Constitutional: He is oriented to person, place, and time. He appears well-developed. No distress.  NAD  HENT:  Mouth/Throat: Oropharynx is clear and moist.  Eyes: Conjunctivae are normal. Pupils are equal, round, and reactive to light.  Neck: Normal range of motion. No JVD present. No thyromegaly present.  Cardiovascular: Normal rate, regular rhythm, normal heart sounds and intact distal pulses. Exam reveals no gallop and no friction rub.  No murmur  heard. Pulmonary/Chest: Effort normal and breath sounds normal. No respiratory distress. He has no wheezes. He has no rales. He exhibits no tenderness.  Abdominal: Soft. Bowel sounds are normal. He exhibits no distension and no mass. There is no tenderness. There is no rebound and no guarding.  Genitourinary: Rectum normal and prostate normal. Rectal exam shows guaiac negative stool.  Musculoskeletal: Normal range of motion. He exhibits no edema or tenderness.  Lymphadenopathy:    He has no cervical adenopathy.  Neurological: He is alert and oriented to person, place, and time. He has normal reflexes. No cranial nerve deficit. He exhibits normal muscle tone. He displays a negative Romberg sign. Coordination and gait normal.  Skin: Skin is warm and dry. No rash noted.  Psychiatric: He has a normal mood and affect. His behavior is normal. Judgment and thought content normal.  prostate <1+  Lab Results  Component Value Date   WBC 5.6 04/08/2017   HGB 17.2 (H) 04/08/2017   HCT 51.0 04/08/2017   PLT 212.0 04/08/2017   GLUCOSE 129 (H) 04/08/2017   CHOL 218 (H) 04/08/2017   TRIG 79.0 04/08/2017   HDL 91.70 04/08/2017   LDLCALC 111 (H) 04/08/2017   ALT 17 04/08/2017   AST 17 04/08/2017   NA 139 04/08/2017   K 4.5 04/08/2017   CL 101 04/08/2017   CREATININE 0.94 04/08/2017   BUN 15 04/08/2017   CO2 29 04/08/2017   TSH 1.95 04/08/2017   PSA 1.25 04/08/2017   HGBA1C 6.7 (H) 12/03/2016   MICROALBUR 1.5 08/02/2015    Ct Chest Lung Ca Screen Low Dose W/o Cm  Result Date: 07/10/2016 CLINICAL DATA:  71 year old male former smoker (quit 15 years ago) with 60 pack-year history of smoking. Lung cancer screening examination. EXAM: CT CHEST WITHOUT CONTRAST LOW-DOSE FOR LUNG CANCER SCREENING TECHNIQUE: Multidetector CT imaging of the chest was performed following the standard protocol without IV contrast. COMPARISON:  Low-dose lung cancer screening chest CT 06/20/2015. FINDINGS: Cardiovascular:  Heart size is normal. There is no significant pericardial fluid, thickening or pericardial calcification. There is aortic atherosclerosis, as well as atherosclerosis of the great vessels of the mediastinum and the coronary arteries, including calcified atherosclerotic plaque in the left main, left anterior descending, left circumflex and right coronary arteries. Severe calcifications of the aortic valve. Mediastinum/Nodes: No pathologically enlarged mediastinal or hilar lymph nodes. Please note that accurate exclusion of hilar adenopathy is limited on noncontrast CT scans. Esophagus is unremarkable in appearance. No axillary lymphadenopathy. Lungs/Pleura: 2 tiny pulmonary nodules are noted in the lungs bilaterally, the largest noncalcified nodule is in the left lower lobe (image 132 of series 3), with a volume derived mean diameter of only 2 mm. No other larger more suspicious appearing pulmonary nodules or masses are  noted. No acute consolidative airspace disease. No pleural effusions. Mild diffuse bronchial thickening with mild centrilobular and paraseptal emphysema. Upper Abdomen: Aortic atherosclerosis. Musculoskeletal: There are no aggressive appearing lytic or blastic lesions noted in the visualized portions of the skeleton. IMPRESSION: 1. Lung-RADS Category 2S, benign appearance or behavior. Continue annual screening with low-dose chest CT without contrast in 12 months. 2. The "S" modifier above refers to potentially clinically significant non lung cancer related findings. Specifically, Aortic atherosclerosis, in addition to left main and 3 vessel coronary artery disease. Please note that although the presence of coronary artery calcium documents the presence of coronary artery disease, the severity of this disease and any potential stenosis cannot be assessed on this non-gated CT examination. Assessment for potential risk factor modification, dietary therapy or pharmacologic therapy may be warranted, if  clinically indicated. 3. There are calcifications of the aortic valve. Echocardiographic correlation for evaluation of potential valvular dysfunction may be warranted if clinically indicated. 4. Mild diffuse bronchial wall thickening with mild centrilobular and paraseptal emphysema; imaging findings suggestive of underlying COPD. Electronically Signed   By: Vinnie Langton M.D.   On: 07/10/2016 09:57    Assessment & Plan:   There are no diagnoses linked to this encounter. I am having Chase Mage. maintain his cholecalciferol, Folic Acid, ONETOUCH DELICA LANCETS 36O, ONE TOUCH ULTRA TEST, Co Q-10, acetaminophen, aspirin EC, amLODipine, traZODone, ALPRAZolam, doxepin, traMADol, pioglitazone, metFORMIN, carvedilol, and pravastatin.  No orders of the defined types were placed in this encounter.    Follow-up: No Follow-up on file.  [name], MD

## 2017-04-15 NOTE — Assessment & Plan Note (Signed)
Recurrent Derm ref

## 2017-04-15 NOTE — Assessment & Plan Note (Signed)
ASA, Pravachol Dr Sallyanne Kuster

## 2017-04-15 NOTE — Assessment & Plan Note (Signed)
A1c 6.4

## 2017-04-15 NOTE — Assessment & Plan Note (Signed)
ASA He will go to [facility] CBC q 3 mo

## 2017-04-23 ENCOUNTER — Other Ambulatory Visit: Payer: Self-pay | Admitting: Internal Medicine

## 2017-05-28 ENCOUNTER — Encounter: Payer: Self-pay | Admitting: Internal Medicine

## 2017-06-10 DIAGNOSIS — L820 Inflamed seborrheic keratosis: Secondary | ICD-10-CM | POA: Diagnosis not present

## 2017-06-10 DIAGNOSIS — D492 Neoplasm of unspecified behavior of bone, soft tissue, and skin: Secondary | ICD-10-CM | POA: Diagnosis not present

## 2017-06-10 DIAGNOSIS — L739 Follicular disorder, unspecified: Secondary | ICD-10-CM | POA: Diagnosis not present

## 2017-06-10 DIAGNOSIS — L570 Actinic keratosis: Secondary | ICD-10-CM | POA: Diagnosis not present

## 2017-06-10 DIAGNOSIS — D2239 Melanocytic nevi of other parts of face: Secondary | ICD-10-CM | POA: Diagnosis not present

## 2017-06-10 DIAGNOSIS — H61009 Unspecified perichondritis of external ear, unspecified ear: Secondary | ICD-10-CM | POA: Diagnosis not present

## 2017-07-07 DIAGNOSIS — H903 Sensorineural hearing loss, bilateral: Secondary | ICD-10-CM | POA: Diagnosis not present

## 2017-07-10 ENCOUNTER — Other Ambulatory Visit (INDEPENDENT_AMBULATORY_CARE_PROVIDER_SITE_OTHER): Payer: PPO

## 2017-07-10 DIAGNOSIS — E119 Type 2 diabetes mellitus without complications: Secondary | ICD-10-CM | POA: Diagnosis not present

## 2017-07-10 DIAGNOSIS — D751 Secondary polycythemia: Secondary | ICD-10-CM

## 2017-07-10 LAB — CBC WITH DIFFERENTIAL/PLATELET
BASOS ABS: 0.1 10*3/uL (ref 0.0–0.1)
BASOS PCT: 1 % (ref 0.0–3.0)
Eosinophils Absolute: 0.3 10*3/uL (ref 0.0–0.7)
Eosinophils Relative: 5.8 % — ABNORMAL HIGH (ref 0.0–5.0)
HCT: 47.1 % (ref 39.0–52.0)
HEMOGLOBIN: 16.6 g/dL (ref 13.0–17.0)
LYMPHS PCT: 29.4 % (ref 12.0–46.0)
Lymphs Abs: 1.6 10*3/uL (ref 0.7–4.0)
MCHC: 35.2 g/dL (ref 30.0–36.0)
MCV: 95.2 fl (ref 78.0–100.0)
MONOS PCT: 13.4 % — AB (ref 3.0–12.0)
Monocytes Absolute: 0.8 10*3/uL (ref 0.1–1.0)
NEUTROS ABS: 2.8 10*3/uL (ref 1.4–7.7)
Neutrophils Relative %: 50.4 % (ref 43.0–77.0)
PLATELETS: 227 10*3/uL (ref 150.0–400.0)
RBC: 4.95 Mil/uL (ref 4.22–5.81)
RDW: 13.2 % (ref 11.5–15.5)
WBC: 5.6 10*3/uL (ref 4.0–10.5)

## 2017-07-10 LAB — BASIC METABOLIC PANEL
BUN: 15 mg/dL (ref 6–23)
CO2: 28 mEq/L (ref 19–32)
Calcium: 9.3 mg/dL (ref 8.4–10.5)
Chloride: 101 mEq/L (ref 96–112)
Creatinine, Ser: 0.86 mg/dL (ref 0.40–1.50)
GFR: 92.61 mL/min (ref >60.00)
Glucose, Bld: 123 mg/dL — ABNORMAL HIGH (ref 70–99)
POTASSIUM: 4.2 meq/L (ref 3.5–5.1)
Sodium: 138 mEq/L (ref 135–145)

## 2017-07-10 LAB — HEMOGLOBIN A1C: Hgb A1c MFr Bld: 6.8 % — ABNORMAL HIGH (ref 4.6–6.5)

## 2017-07-11 LAB — IRON,TIBC AND FERRITIN PANEL
%SAT: 18 % (calc) (ref 15–60)
Ferritin: 119 ng/mL (ref 20–380)
Iron: 64 ug/dL (ref 50–180)
TIBC: 358 ug/dL (ref 250–425)

## 2017-07-16 ENCOUNTER — Ambulatory Visit (INDEPENDENT_AMBULATORY_CARE_PROVIDER_SITE_OTHER): Payer: PPO | Admitting: Internal Medicine

## 2017-07-16 ENCOUNTER — Encounter: Payer: Self-pay | Admitting: Internal Medicine

## 2017-07-16 DIAGNOSIS — E119 Type 2 diabetes mellitus without complications: Secondary | ICD-10-CM | POA: Diagnosis not present

## 2017-07-16 DIAGNOSIS — I10 Essential (primary) hypertension: Secondary | ICD-10-CM | POA: Diagnosis not present

## 2017-07-16 DIAGNOSIS — Z87891 Personal history of nicotine dependence: Secondary | ICD-10-CM | POA: Diagnosis not present

## 2017-07-16 MED ORDER — HYDROXYZINE HCL 25 MG PO TABS: 25.0000 mg | ORAL_TABLET | Freq: Every evening | ORAL | 5 refills | Status: DC | PRN

## 2017-07-16 NOTE — Progress Notes (Signed)
Subjective:  Patient ID: Chase Mage., male    DOB: 06/27/1945  Age: 72 y.o. MRN: [id number]  CC: No chief complaint on file.   HPI [name]. presents for DM, HTN, dyslipidemia  Outpatient Medications Prior to Visit  Medication Sig Dispense Refill  . acetaminophen (TYLENOL) 650 MG CR tablet Take 650 mg by mouth 2 (two) times daily as needed for pain.     [name] ALPRAZolam (XANAX) 0.5 MG tablet TAKE 1 TO 2 TABLETS BY MOUTH AT BEDTIME AS NEEDED FOR ANXIETY OR SLEEP 180 tablet 1  . aspirin EC 81 MG tablet Take 81 mg by mouth daily.    . carvedilol (COREG) 25 MG tablet TAKE 1 TABLET BY MOUTH TWICE A DAY WITH MEALS 180 tablet 3  . Cholecalciferol (VITAMIN D3) 1000 UNITS tablet Take 2,000 Units by mouth daily.     . Coenzyme Q10 (CO Q-10) 200 MG CAPS Take 400 mg by mouth daily.     . Folic Acid 0.8 MG CAPS Take 2 capsules by mouth every morning.  30 each 0  . glucose blood (ONE TOUCH ULTRA TEST) test strip TEST TWICE A DAY AS DIRECTED 100 each 3  . Melatonin 10 MG TABS Take by mouth at bedtime.    . metFORMIN (GLUCOPHAGE-XR) 750 MG 24 hr tablet TAKE 1 TABLET BY MOUTH EVERY DAY WITH BREAKFAST 90 tablet 3  . ONETOUCH DELICA LANCETS 96Q MISC Use to check blood sugar twice a day. DX: E11.9 100 each 3  . pioglitazone (ACTOS) 45 MG tablet TAKE 1 TABLET (45 MG TOTAL) BY MOUTH EVERY OTHER DAY. (Patient taking differently: Take 45 mg every other day by mouth. 1/2 tablet every other day) 90 tablet 2  . pravastatin (PRAVACHOL) 20 MG tablet Take 10 mg by mouth once a week.    . traMADol (ULTRAM) 50 MG tablet Take by mouth every 6 (six) hours as needed.    . traZODone (DESYREL) 50 MG tablet Take 1-2 tablets (50-100 mg total) by mouth at bedtime as needed for sleep. 180 tablet 1  . amLODipine (NORVASC) 10 MG tablet Take 1 tablet (10 mg total) by mouth daily before breakfast. (Patient taking differently: Take 5 mg by mouth daily before breakfast. ) 90 tablet 3  . doxepin (SINEQUAN) 10 MG  capsule Take 1-2 capsules (10-20 mg total) by mouth at bedtime as needed. 180 capsule 2   No facility-administered medications prior to visit.     ROS Review of Systems  Constitutional: Negative for appetite change, fatigue and unexpected weight change.  HENT: Negative for congestion, nosebleeds, sneezing, sore throat and trouble swallowing.   Eyes: Negative for itching and visual disturbance.  Respiratory: Negative for cough.   Cardiovascular: Negative for chest pain, palpitations and leg swelling.  Gastrointestinal: Negative for abdominal distention, blood in stool, diarrhea and nausea.  Genitourinary: Negative for frequency and hematuria.  Musculoskeletal: Negative for back pain, gait problem, joint swelling and neck pain.  Skin: Negative for rash.  Neurological: Negative for dizziness, tremors, speech difficulty and weakness.  Psychiatric/Behavioral: Negative for agitation, dysphoric mood and sleep disturbance. The patient is not nervous/anxious.     Objective:  BP 128/72 (BP Location: Left Arm, Patient Position: Sitting, Cuff Size: Large)   Pulse (!) 51   Temp 97.8 F (36.6 C) (Oral)   Ht 5\' 4"  (1.626 m)   Wt 191 lb (86.6 kg)   SpO2 98%   BMI 32.79 kg/m   BP Readings from Last 3  Encounters:  07/16/17 128/72  04/15/17 124/72  12/11/16 112/74    Wt Readings from Last 3 Encounters:  07/16/17 191 lb (86.6 kg)  04/15/17 189 lb (85.7 kg)  12/11/16 187 lb (84.8 kg)    Physical Exam  Constitutional: He is oriented to person, place, and time. He appears well-developed. No distress.  NAD  HENT:  Mouth/Throat: Oropharynx is clear and moist.  Eyes: Conjunctivae are normal. Pupils are equal, round, and reactive to light.  Neck: Normal range of motion. No JVD present. No thyromegaly present.  Cardiovascular: Normal rate, regular rhythm, normal heart sounds and intact distal pulses. Exam reveals no gallop and no friction rub.  No murmur heard. Pulmonary/Chest: Effort normal  and breath sounds normal. No respiratory distress. He has no wheezes. He has no rales. He exhibits no tenderness.  Abdominal: Soft. Bowel sounds are normal. He exhibits no distension and no mass. There is no tenderness. There is no rebound and no guarding.  Musculoskeletal: Normal range of motion. He exhibits no edema or tenderness.  Lymphadenopathy:    He has no cervical adenopathy.  Neurological: He is alert and oriented to person, place, and time. He has normal reflexes. No cranial nerve deficit. He exhibits normal muscle tone. He displays a negative Romberg sign. Coordination and gait normal.  Skin: Skin is warm and dry. No rash noted.  Psychiatric: He has a normal mood and affect. His behavior is normal. Judgment and thought content normal.    Lab Results  Component Value Date   WBC 5.6 07/10/2017   HGB 16.6 07/10/2017   HCT 47.1 07/10/2017   PLT 227.0 07/10/2017   GLUCOSE 123 (H) 07/10/2017   CHOL 218 (H) 04/08/2017   TRIG 79.0 04/08/2017   HDL 91.70 04/08/2017   LDLCALC 111 (H) 04/08/2017   ALT 17 04/08/2017   AST 17 04/08/2017   NA 138 07/10/2017   K 4.2 07/10/2017   CL 101 07/10/2017   CREATININE 0.86 07/10/2017   BUN 15 07/10/2017   CO2 28 07/10/2017   TSH 1.95 04/08/2017   PSA 1.25 04/08/2017   HGBA1C 6.8 (H) 07/10/2017   MICROALBUR 1.5 08/02/2015    Ct Chest Lung Ca Screen Low Dose W/o Cm  Result Date: 07/10/2016 CLINICAL DATA:  72 year old male former smoker (quit 15 years ago) with 60 pack-year history of smoking. Lung cancer screening examination. EXAM: CT CHEST WITHOUT CONTRAST LOW-DOSE FOR LUNG CANCER SCREENING TECHNIQUE: Multidetector CT imaging of the chest was performed following the standard protocol without IV contrast. COMPARISON:  Low-dose lung cancer screening chest CT 06/20/2015. FINDINGS: Cardiovascular: Heart size is normal. There is no significant pericardial fluid, thickening or pericardial calcification. There is aortic atherosclerosis, as well as  atherosclerosis of the great vessels of the mediastinum and the coronary arteries, including calcified atherosclerotic plaque in the left main, left anterior descending, left circumflex and right coronary arteries. Severe calcifications of the aortic valve. Mediastinum/Nodes: No pathologically enlarged mediastinal or hilar lymph nodes. Please note that accurate exclusion of hilar adenopathy is limited on noncontrast CT scans. Esophagus is unremarkable in appearance. No axillary lymphadenopathy. Lungs/Pleura: 2 tiny pulmonary nodules are noted in the lungs bilaterally, the largest noncalcified nodule is in the left lower lobe (image 132 of series 3), with a volume derived mean diameter of only 2 mm. No other larger more suspicious appearing pulmonary nodules or masses are noted. No acute consolidative airspace disease. No pleural effusions. Mild diffuse bronchial thickening with mild centrilobular and paraseptal emphysema. Upper Abdomen: Aortic  atherosclerosis. Musculoskeletal: There are no aggressive appearing lytic or blastic lesions noted in the visualized portions of the skeleton. IMPRESSION: 1. Lung-RADS Category 2S, benign appearance or behavior. Continue annual screening with low-dose chest CT without contrast in 12 months. 2. The "S" modifier above refers to potentially clinically significant non lung cancer related findings. Specifically, Aortic atherosclerosis, in addition to left main and 3 vessel coronary artery disease. Please note that although the presence of coronary artery calcium documents the presence of coronary artery disease, the severity of this disease and any potential stenosis cannot be assessed on this non-gated CT examination. Assessment for potential risk factor modification, dietary therapy or pharmacologic therapy may be warranted, if clinically indicated. 3. There are calcifications of the aortic valve. Echocardiographic correlation for evaluation of potential valvular dysfunction may  be warranted if clinically indicated. 4. Mild diffuse bronchial wall thickening with mild centrilobular and paraseptal emphysema; imaging findings suggestive of underlying COPD. Electronically Signed   By: Vinnie Langton M.D.   On: 07/10/2016 09:57    Assessment & Plan:   There are no diagnoses linked to this encounter. I have discontinued Chase Kail Jr.'s doxepin. I am also having him maintain his cholecalciferol, Folic Acid, Co L-24, acetaminophen, aspirin EC, amLODipine, traZODone, ALPRAZolam, traMADol, pioglitazone, pravastatin, glucose blood, ONETOUCH DELICA LANCETS 40N, carvedilol, metFORMIN, and Melatonin.  No orders of the defined types were placed in this encounter.    Follow-up: No Follow-up on file.  [name], MD

## 2017-07-16 NOTE — Addendum Note (Signed)
Addended by: Karren Cobble on: 07/16/2017 11:43 AM   Modules accepted: Orders

## 2017-07-16 NOTE — Assessment & Plan Note (Signed)
BP Readings from Last 3 Encounters:  07/16/17 128/72  04/15/17 124/72  12/11/16 112/74

## 2017-07-16 NOTE — Assessment & Plan Note (Signed)
Repeat CT

## 2017-07-16 NOTE — Assessment & Plan Note (Signed)
Metfomin XR, Actos Loose wt

## 2017-07-18 ENCOUNTER — Telehealth: Payer: Self-pay

## 2017-07-18 NOTE — Telephone Encounter (Signed)
Case # [id number]

## 2017-07-22 DIAGNOSIS — H43823 Vitreomacular adhesion, bilateral: Secondary | ICD-10-CM | POA: Diagnosis not present

## 2017-07-22 DIAGNOSIS — H903 Sensorineural hearing loss, bilateral: Secondary | ICD-10-CM | POA: Diagnosis not present

## 2017-07-22 DIAGNOSIS — H2513 Age-related nuclear cataract, bilateral: Secondary | ICD-10-CM | POA: Diagnosis not present

## 2017-07-24 NOTE — Telephone Encounter (Signed)
PA approved through 05/26/18 

## 2017-07-28 DIAGNOSIS — E119 Type 2 diabetes mellitus without complications: Secondary | ICD-10-CM | POA: Diagnosis not present

## 2017-07-28 LAB — HM DIABETES EYE EXAM

## 2017-08-07 ENCOUNTER — Encounter: Payer: Self-pay | Admitting: Internal Medicine

## 2017-08-12 ENCOUNTER — Other Ambulatory Visit: Payer: Self-pay | Admitting: Internal Medicine

## 2017-08-22 ENCOUNTER — Telehealth: Payer: Self-pay

## 2017-08-22 NOTE — Telephone Encounter (Signed)
Copied from [place]. Topic: Referral - Status >> Aug 15, 2017  9:30 AM Scherrie Gerlach wrote: Reason for CRM: pt (wife) following up on the CT screening he was to have. Pt aware doesn't qualify for the lung cancer screening, but dr was to put in a new order for a ct scan. Pt also wants to know what his cost wil be.

## 2017-08-24 NOTE — Telephone Encounter (Signed)
I'm not aware. Please send to Pulmonology. Thx

## 2017-08-25 NOTE — Telephone Encounter (Signed)
This patient does not qualify for lung cancer screening. He has quit smoking for > 15 years. His best bet is to  have a scan done at [facility] if he is paying for  this out of his pocket. The cost is $299.00. We cannot follow this patient as he does not qualify for screening through the program. If Dr. Alain Marion wants to follow the patient on his own, he needs to place the order and follow annually. Thank you.

## 2017-08-25 NOTE — Telephone Encounter (Signed)
Pt wife notified and will call us back to let us know if pt wants to do screening

## 2017-08-25 NOTE — Telephone Encounter (Signed)
Please see CRM 

## 2017-09-05 ENCOUNTER — Telehealth: Payer: Self-pay | Admitting: Internal Medicine

## 2017-09-05 NOTE — Telephone Encounter (Signed)
Spoke with Langley Gauss in [facility]. She will contact pt regarding this

## 2017-09-05 NOTE — Telephone Encounter (Signed)
Copied from [place] [phone]. Topic: Quick Communication - See Telephone Encounter >> Sep 05, 2017  8:41 AM Robina Ade, Helene Kelp D wrote: CRM for notification. See Telephone encounter for: 09/05/17. Pt wife [name] called and asked if Dr. Alain Marion can Please pre-authorize/certified patients CT Lung scan. It has been approved with his insurance. If any questions you can call her back.

## 2017-09-18 ENCOUNTER — Inpatient Hospital Stay: Admission: RE | Admit: 2017-09-18 | Payer: PPO | Source: Ambulatory Visit

## 2017-09-23 ENCOUNTER — Ambulatory Visit (INDEPENDENT_AMBULATORY_CARE_PROVIDER_SITE_OTHER)
Admission: RE | Admit: 2017-09-23 | Discharge: 2017-09-23 | Disposition: A | Discharge status: Home / Self Care | Payer: PPO | Source: Ambulatory Visit | Attending: Internal Medicine | Admitting: Internal Medicine

## 2017-09-23 DIAGNOSIS — Z87891 Personal history of nicotine dependence: Secondary | ICD-10-CM

## 2017-10-02 ENCOUNTER — Telehealth: Payer: Self-pay

## 2017-10-02 NOTE — Telephone Encounter (Signed)
Please advise  Copied from [place] [id number]. Topic: Quick Communication - Other Results >> Sep 29, 2017  9:45 AM Carolyn Stare wrote:  Pt would like a call back about his CT scan he had on 09/23/17

## 2017-10-03 NOTE — Telephone Encounter (Signed)
I called and left a VM Thx 

## 2017-10-07 ENCOUNTER — Other Ambulatory Visit (INDEPENDENT_AMBULATORY_CARE_PROVIDER_SITE_OTHER): Payer: PPO

## 2017-10-07 DIAGNOSIS — E119 Type 2 diabetes mellitus without complications: Secondary | ICD-10-CM

## 2017-10-07 LAB — BASIC METABOLIC PANEL
BUN: 18 mg/dL (ref 6–23)
CHLORIDE: 104 meq/L (ref 96–112)
CO2: 26 meq/L (ref 19–32)
Calcium: 10 mg/dL (ref 8.4–10.5)
Creatinine, Ser: 0.85 mg/dL (ref 0.40–1.50)
GFR: 93.8 mL/min (ref >60.00)
Glucose, Bld: 120 mg/dL — ABNORMAL HIGH (ref 70–99)
POTASSIUM: 4.5 meq/L (ref 3.5–5.1)
Sodium: 139 mEq/L (ref 135–145)

## 2017-10-07 LAB — HEMOGLOBIN A1C: HEMOGLOBIN A1C: 6.8 % — AB (ref 4.6–6.5)

## 2017-10-15 ENCOUNTER — Ambulatory Visit (INDEPENDENT_AMBULATORY_CARE_PROVIDER_SITE_OTHER): Payer: PPO | Admitting: Internal Medicine

## 2017-10-15 ENCOUNTER — Encounter: Payer: Self-pay | Admitting: Internal Medicine

## 2017-10-15 VITALS — BP 124/72 | HR 53 | Temp 97.9°F | Ht 64.0 in | Wt 188.0 lb

## 2017-10-15 DIAGNOSIS — D751 Secondary polycythemia: Secondary | ICD-10-CM | POA: Diagnosis not present

## 2017-10-15 DIAGNOSIS — Z87891 Personal history of nicotine dependence: Secondary | ICD-10-CM | POA: Diagnosis not present

## 2017-10-15 DIAGNOSIS — F5101 Primary insomnia: Secondary | ICD-10-CM

## 2017-10-15 DIAGNOSIS — E785 Hyperlipidemia, unspecified: Secondary | ICD-10-CM | POA: Diagnosis not present

## 2017-10-15 DIAGNOSIS — E119 Type 2 diabetes mellitus without complications: Secondary | ICD-10-CM

## 2017-10-15 MED ORDER — PITAVASTATIN CALCIUM 2 MG PO TABS: 1.0000 | ORAL_TABLET | Freq: Every day | ORAL | 11 refills | Status: DC

## 2017-10-15 MED ORDER — ICOSAPENT ETHYL 1 G PO CAPS: 2.0000 | ORAL_CAPSULE | Freq: Two times a day (BID) | ORAL | 11 refills | Status: DC

## 2017-10-15 NOTE — Assessment & Plan Note (Signed)
cbc

## 2017-10-15 NOTE — Assessment & Plan Note (Signed)
Metfomin XR, Actos 

## 2017-10-15 NOTE — Assessment & Plan Note (Signed)
Chest CT to repeat in May 2019

## 2017-10-15 NOTE — Progress Notes (Signed)
Subjective:  Patient ID: Chase Mage., male    DOB: 01/11/1946  Age: 72 y.o. MRN: [id number]  CC: No chief complaint on file.   HPI [name]. presents for HTN, DM, atherosclerosis   Outpatient Medications Prior to Visit  Medication Sig Dispense Refill  . acetaminophen (TYLENOL) 650 MG CR tablet Take 650 mg by mouth 2 (two) times daily as needed for pain.     [name] ALPRAZolam (XANAX) 0.5 MG tablet TAKE 1 TO 2 TABLETS BY MOUTH AT BEDTIME AS NEEDED FOR ANXIETY OR SLEEP 180 tablet 1  . aspirin EC 81 MG tablet Take 81 mg by mouth daily.    . carvedilol (COREG) 25 MG tablet TAKE 1 TABLET BY MOUTH TWICE A DAY WITH MEALS 180 tablet 3  . Cholecalciferol (VITAMIN D3) 1000 UNITS tablet Take 2,000 Units by mouth daily.     . Coenzyme Q10 (CO Q-10) 200 MG CAPS Take 400 mg by mouth daily.     [name] doxepin (SINEQUAN) 10 MG capsule TAKE 1-2 CAPSULES (10-20 MG TOTAL) BY MOUTH AT BEDTIME AS NEEDED. 180 capsule 3  . Folic Acid 0.8 MG CAPS Take 2 capsules by mouth every morning.  30 each 0  . glucose blood (ONE TOUCH ULTRA TEST) test strip TEST TWICE A DAY AS DIRECTED 100 each 3  . hydrOXYzine (ATARAX/VISTARIL) 25 MG tablet Take 1-2 tablets (25-50 mg total) by mouth at bedtime as needed (insomnia). 60 tablet 5  . KRILL OIL PO Take by mouth.    . metFORMIN (GLUCOPHAGE-XR) 750 MG 24 hr tablet TAKE 1 TABLET BY MOUTH EVERY DAY WITH BREAKFAST 90 tablet 3  . ONETOUCH DELICA LANCETS 63Z MISC Use to check blood sugar twice a day. DX: E11.9 100 each 3  . pioglitazone (ACTOS) 45 MG tablet TAKE 1 TABLET (45 MG TOTAL) BY MOUTH EVERY OTHER DAY. (Patient taking differently: Take 45 mg every other day by mouth. 1/2 tablet every other day) 90 tablet 2  . traMADol (ULTRAM) 50 MG tablet Take by mouth every 6 (six) hours as needed.    . traZODone (DESYREL) 50 MG tablet Take 1-2 tablets (50-100 mg total) by mouth at bedtime as needed for sleep. 180 tablet 1  . amLODipine (NORVASC) 10 MG tablet Take 1 tablet (10 mg  total) by mouth daily before breakfast. (Patient taking differently: Take 5 mg by mouth daily before breakfast. ) 90 tablet 3  . Melatonin 10 MG TABS Take by mouth at bedtime.    . pravastatin (PRAVACHOL) 20 MG tablet Take 10 mg by mouth once a week.     No facility-administered medications prior to visit.     ROS Review of Systems  Constitutional: Negative for appetite change, fatigue and unexpected weight change.  HENT: Negative for congestion, nosebleeds, sneezing, sore throat and trouble swallowing.   Eyes: Negative for itching and visual disturbance.  Respiratory: Negative for cough.   Cardiovascular: Negative for chest pain, palpitations and leg swelling.  Gastrointestinal: Negative for abdominal distention, blood in stool, diarrhea and nausea.  Genitourinary: Negative for frequency and hematuria.  Musculoskeletal: Negative for back pain, gait problem, joint swelling and neck pain.  Skin: Negative for rash.  Neurological: Negative for dizziness, tremors, speech difficulty and weakness.  Psychiatric/Behavioral: Positive for sleep disturbance. Negative for agitation and dysphoric mood. The patient is not nervous/anxious.     Objective:  BP 124/72 (BP Location: Left Arm, Patient Position: Sitting, Cuff Size: Large)   Pulse (!) 53  Temp 97.9 F (36.6 C) (Oral)   Ht 5\' 4"  (1.626 m)   Wt 188 lb (85.3 kg)   SpO2 98%   BMI 32.27 kg/m   BP Readings from Last 3 Encounters:  10/15/17 124/72  07/16/17 128/72  04/15/17 124/72    Wt Readings from Last 3 Encounters:  10/15/17 188 lb (85.3 kg)  07/16/17 191 lb (86.6 kg)  04/15/17 189 lb (85.7 kg)    Physical Exam  Constitutional: He is oriented to person, place, and time. He appears well-developed. No distress.  NAD  HENT:  Mouth/Throat: Oropharynx is clear and moist.  Eyes: Pupils are equal, round, and reactive to light. Conjunctivae are normal.  Neck: Normal range of motion. No JVD present. No thyromegaly present.    Cardiovascular: Normal rate, regular rhythm, normal heart sounds and intact distal pulses. Exam reveals no gallop and no friction rub.  No murmur heard. Pulmonary/Chest: Effort normal and breath sounds normal. No respiratory distress. He has no wheezes. He has no rales. He exhibits no tenderness.  Abdominal: Soft. Bowel sounds are normal. He exhibits no distension and no mass. There is no tenderness. There is no rebound and no guarding.  Musculoskeletal: Normal range of motion. He exhibits no edema or tenderness.  Lymphadenopathy:    He has no cervical adenopathy.  Neurological: He is alert and oriented to person, place, and time. He has normal reflexes. No cranial nerve deficit. He exhibits normal muscle tone. He displays a negative Romberg sign. Coordination and gait normal.  Skin: Skin is warm and dry. No rash noted.  Psychiatric: He has a normal mood and affect. His behavior is normal. Judgment and thought content normal.    Lab Results  Component Value Date   WBC 5.6 07/10/2017   HGB 16.6 07/10/2017   HCT 47.1 07/10/2017   PLT 227.0 07/10/2017   GLUCOSE 120 (H) 10/07/2017   CHOL 218 (H) 04/08/2017   TRIG 79.0 04/08/2017   HDL 91.70 04/08/2017   LDLCALC 111 (H) 04/08/2017   ALT 17 04/08/2017   AST 17 04/08/2017   NA 139 10/07/2017   K 4.5 10/07/2017   CL 104 10/07/2017   CREATININE 0.85 10/07/2017   BUN 18 10/07/2017   CO2 26 10/07/2017   TSH 1.95 04/08/2017   PSA 1.25 04/08/2017   HGBA1C 6.8 (H) 10/07/2017   MICROALBUR 1.5 08/02/2015    Ct Chest Lung Ca Screen Low Dose W/o Cm  Result Date: 09/23/2017 CLINICAL DATA:  Sixty pack-year smoking history. Ex-smoker, quitting 15 years ago. Asymptomatic. EXAM: CT CHEST WITHOUT CONTRAST LOW-DOSE FOR LUNG CANCER SCREENING TECHNIQUE: Multidetector CT imaging of the chest was performed following the standard protocol without IV contrast. COMPARISON:  07/09/2016 FINDINGS: Cardiovascular: Aortic and branch vessel atherosclerosis.  Normal heart size, without pericardial effusion. Multivessel coronary artery atherosclerosis. Aortic valve calcification. Mediastinum/Nodes: No mediastinal or definite hilar adenopathy, given limitations of unenhanced CT. Lungs/Pleura: No pleural fluid. Mild paraseptal and centrilobular emphysema. Bilateral tiny pulmonary nodules are not significantly changed. No new or enlarging pulmonary nodules identified. Upper Abdomen: Normal imaged portions of the liver, spleen, stomach, pancreas, adrenal glands, kidneys. Musculoskeletal: Moderate thoracic spondylosis. IMPRESSION: 1. Lung-RADS 2, benign appearance or behavior. Continue annual screening with low-dose chest CT without contrast in 12 months. 2. Aortic atherosclerosis (ICD10-I70.0), coronary artery atherosclerosis and emphysema (ICD10-J43.9). 3. Aortic valvular calcifications. Consider echocardiography to evaluate for valvular dysfunction. Electronically Signed   By: Abigail Miyamoto M.D.   On: 09/23/2017 13:52    Assessment & Plan:  There are no diagnoses linked to this encounter. I have discontinued Donovan Kail Jr.'s pravastatin and Melatonin. I am also having him maintain his cholecalciferol, Folic Acid, Co K-59, acetaminophen, aspirin EC, amLODipine, traZODone, ALPRAZolam, traMADol, pioglitazone, glucose blood, ONETOUCH DELICA LANCETS 97F, carvedilol, metFORMIN, hydrOXYzine, doxepin, and KRILL OIL PO.  No orders of the defined types were placed in this encounter.    Follow-up: No follow-ups on file.  [name], MD

## 2017-10-15 NOTE — Assessment & Plan Note (Signed)
Hydroxyzine at hs 

## 2017-10-15 NOTE — Assessment & Plan Note (Signed)
trial of Pitavastatin, Vascepa 

## 2017-11-03 ENCOUNTER — Encounter: Payer: Self-pay | Admitting: Internal Medicine

## 2018-01-07 ENCOUNTER — Other Ambulatory Visit (INDEPENDENT_AMBULATORY_CARE_PROVIDER_SITE_OTHER): Payer: PPO

## 2018-01-07 DIAGNOSIS — E119 Type 2 diabetes mellitus without complications: Secondary | ICD-10-CM | POA: Diagnosis not present

## 2018-01-07 LAB — CBC
HCT: 46.9 % (ref 39.0–52.0)
Hemoglobin: 16.2 g/dL (ref 13.0–17.0)
MCHC: 34.5 g/dL (ref 30.0–36.0)
MCV: 93.8 fl (ref 78.0–100.0)
Platelets: 212 10*3/uL (ref 150.0–400.0)
RBC: 4.99 Mil/uL (ref 4.22–5.81)
RDW: 13.7 % (ref 11.5–15.5)
WBC: 5.4 10*3/uL (ref 4.0–10.5)

## 2018-01-07 LAB — BASIC METABOLIC PANEL
BUN: 17 mg/dL (ref 6–23)
CALCIUM: 9.7 mg/dL (ref 8.4–10.5)
CO2: 27 meq/L (ref 19–32)
CREATININE: 1 mg/dL (ref 0.40–1.50)
Chloride: 101 mEq/L (ref 96–112)
GFR: 77.71 mL/min (ref >60.00)
GLUCOSE: 134 mg/dL — AB (ref 70–99)
Potassium: 4.2 mEq/L (ref 3.5–5.1)
Sodium: 137 mEq/L (ref 135–145)

## 2018-01-07 LAB — HEMOGLOBIN A1C: HEMOGLOBIN A1C: 7 % — AB (ref 4.6–6.5)

## 2018-01-14 ENCOUNTER — Encounter: Payer: Self-pay | Admitting: Internal Medicine

## 2018-01-14 ENCOUNTER — Ambulatory Visit (INDEPENDENT_AMBULATORY_CARE_PROVIDER_SITE_OTHER): Payer: PPO | Admitting: Internal Medicine

## 2018-01-14 VITALS — BP 122/66 | HR 57 | Temp 97.9°F | Ht 64.0 in | Wt 190.0 lb

## 2018-01-14 DIAGNOSIS — D751 Secondary polycythemia: Secondary | ICD-10-CM

## 2018-01-14 DIAGNOSIS — E119 Type 2 diabetes mellitus without complications: Secondary | ICD-10-CM | POA: Diagnosis not present

## 2018-01-14 DIAGNOSIS — N320 Bladder-neck obstruction: Secondary | ICD-10-CM

## 2018-01-14 DIAGNOSIS — E785 Hyperlipidemia, unspecified: Secondary | ICD-10-CM

## 2018-01-14 NOTE — Progress Notes (Signed)
Subjective:  Patient ID: Chase Mage., male    DOB: 10/18/1945  Age: 72 y.o. MRN: [id number]  CC: No chief complaint on file.   HPI [name]. presents for DM, HTN, dyslipidemia f/u  Outpatient Medications Prior to Visit  Medication Sig Dispense Refill  . acetaminophen (TYLENOL) 650 MG CR tablet Take 650 mg by mouth 2 (two) times daily as needed for pain.     [name] ALPRAZolam (XANAX) 0.5 MG tablet TAKE 1 TO 2 TABLETS BY MOUTH AT BEDTIME AS NEEDED FOR ANXIETY OR SLEEP 180 tablet 1  . aspirin EC 81 MG tablet Take 81 mg by mouth daily.    . carvedilol (COREG) 25 MG tablet TAKE 1 TABLET BY MOUTH TWICE A DAY WITH MEALS 180 tablet 3  . Cholecalciferol (VITAMIN D3) 1000 UNITS tablet Take 2,000 Units by mouth daily.     . Coenzyme Q10 (CO Q-10) 200 MG CAPS Take 400 mg by mouth daily.     [name] doxepin (SINEQUAN) 10 MG capsule TAKE 1-2 CAPSULES (10-20 MG TOTAL) BY MOUTH AT BEDTIME AS NEEDED. 180 capsule 3  . Folic Acid 0.8 MG CAPS Take 2 capsules by mouth every morning.  30 each 0  . glucose blood (ONE TOUCH ULTRA TEST) test strip TEST TWICE A DAY AS DIRECTED 100 each 3  . hydrOXYzine (ATARAX/VISTARIL) 25 MG tablet Take 1-2 tablets (25-50 mg total) by mouth at bedtime as needed (insomnia). 60 tablet 5  . KRILL OIL PO Take by mouth.    . metFORMIN (GLUCOPHAGE-XR) 750 MG 24 hr tablet TAKE 1 TABLET BY MOUTH EVERY DAY WITH BREAKFAST 90 tablet 3  . ONETOUCH DELICA LANCETS 41P MISC Use to check blood sugar twice a day. DX: E11.9 100 each 3  . pioglitazone (ACTOS) 45 MG tablet TAKE 1 TABLET (45 MG TOTAL) BY MOUTH EVERY OTHER DAY. (Patient taking differently: Take 45 mg every other day by mouth. 1/2 tablet every other day) 90 tablet 2  . Pitavastatin Calcium 2 MG TABS Take 1 tablet (2 mg total) by mouth daily. 30 tablet 11  . traMADol (ULTRAM) 50 MG tablet Take by mouth every 6 (six) hours as needed.    . traZODone (DESYREL) 50 MG tablet Take 1-2 tablets (50-100 mg total) by mouth at bedtime  as needed for sleep. 180 tablet 1  . amLODipine (NORVASC) 10 MG tablet Take 1 tablet (10 mg total) by mouth daily before breakfast. (Patient taking differently: Take 5 mg by mouth daily before breakfast. ) 90 tablet 3  . Icosapent Ethyl (VASCEPA) 1 g CAPS Take 2 capsules (2 g total) by mouth 2 (two) times daily. 120 capsule 11   No facility-administered medications prior to visit.     ROS: Review of Systems  Constitutional: Negative for appetite change, fatigue and unexpected weight change.  HENT: Negative for congestion, nosebleeds, sneezing, sore throat and trouble swallowing.   Eyes: Negative for itching and visual disturbance.  Respiratory: Negative for cough.   Cardiovascular: Negative for chest pain, palpitations and leg swelling.  Gastrointestinal: Negative for abdominal distention, blood in stool, diarrhea and nausea.  Genitourinary: Negative for frequency and hematuria.  Musculoskeletal: Negative for back pain, gait problem, joint swelling and neck pain.  Skin: Negative for rash.  Neurological: Negative for dizziness, tremors, speech difficulty and weakness.  Psychiatric/Behavioral: Negative for agitation, dysphoric mood, sleep disturbance and suicidal ideas. The patient is not nervous/anxious.     Objective:  BP 122/66 (BP Location: Left Arm, Patient  Position: Sitting, Cuff Size: Normal)   Pulse (!) 57   Temp 97.9 F (36.6 C) (Oral)   Ht 5\' 4"  (1.626 m)   Wt 190 lb (86.2 kg)   SpO2 96%   BMI 32.61 kg/m   BP Readings from Last 3 Encounters:  01/14/18 122/66  10/15/17 124/72  07/16/17 128/72    Wt Readings from Last 3 Encounters:  01/14/18 190 lb (86.2 kg)  10/15/17 188 lb (85.3 kg)  07/16/17 191 lb (86.6 kg)    Physical Exam  Constitutional: He is oriented to person, place, and time. He appears well-developed. No distress.  NAD  HENT:  Mouth/Throat: Oropharynx is clear and moist.  Eyes: Pupils are equal, round, and reactive to light. Conjunctivae are normal.   Neck: Normal range of motion. No JVD present. No thyromegaly present.  Cardiovascular: Normal rate, regular rhythm, normal heart sounds and intact distal pulses. Exam reveals no gallop and no friction rub.  No murmur heard. Pulmonary/Chest: Effort normal and breath sounds normal. No respiratory distress. He has no wheezes. He has no rales. He exhibits no tenderness.  Abdominal: Soft. Bowel sounds are normal. He exhibits no distension and no mass. There is no tenderness. There is no rebound and no guarding.  Musculoskeletal: Normal range of motion. He exhibits no edema or tenderness.  Lymphadenopathy:    He has no cervical adenopathy.  Neurological: He is alert and oriented to person, place, and time. He has normal reflexes. No cranial nerve deficit. He exhibits normal muscle tone. He displays a negative Romberg sign. Coordination and gait normal.  Skin: Skin is warm and dry. No rash noted.  Psychiatric: He has a normal mood and affect. His behavior is normal. Judgment and thought content normal.    Lab Results  Component Value Date   WBC 5.4 01/07/2018   HGB 16.2 01/07/2018   HCT 46.9 01/07/2018   PLT 212.0 01/07/2018   GLUCOSE 134 (H) 01/07/2018   CHOL 218 (H) 04/08/2017   TRIG 79.0 04/08/2017   HDL 91.70 04/08/2017   LDLCALC 111 (H) 04/08/2017   ALT 17 04/08/2017   AST 17 04/08/2017   NA 137 01/07/2018   K 4.2 01/07/2018   CL 101 01/07/2018   CREATININE 1.00 01/07/2018   BUN 17 01/07/2018   CO2 27 01/07/2018   TSH 1.95 04/08/2017   PSA 1.25 04/08/2017   HGBA1C 7.0 (H) 01/07/2018   MICROALBUR 1.5 08/02/2015    Ct Chest Lung Ca Screen Low Dose W/o Cm  Result Date: 09/23/2017 CLINICAL DATA:  Sixty pack-year smoking history. Ex-smoker, quitting 15 years ago. Asymptomatic. EXAM: CT CHEST WITHOUT CONTRAST LOW-DOSE FOR LUNG CANCER SCREENING TECHNIQUE: Multidetector CT imaging of the chest was performed following the standard protocol without IV contrast. COMPARISON:  07/09/2016  FINDINGS: Cardiovascular: Aortic and branch vessel atherosclerosis. Normal heart size, without pericardial effusion. Multivessel coronary artery atherosclerosis. Aortic valve calcification. Mediastinum/Nodes: No mediastinal or definite hilar adenopathy, given limitations of unenhanced CT. Lungs/Pleura: No pleural fluid. Mild paraseptal and centrilobular emphysema. Bilateral tiny pulmonary nodules are not significantly changed. No new or enlarging pulmonary nodules identified. Upper Abdomen: Normal imaged portions of the liver, spleen, stomach, pancreas, adrenal glands, kidneys. Musculoskeletal: Moderate thoracic spondylosis. IMPRESSION: 1. Lung-RADS 2, benign appearance or behavior. Continue annual screening with low-dose chest CT without contrast in 12 months. 2. Aortic atherosclerosis (ICD10-I70.0), coronary artery atherosclerosis and emphysema (ICD10-J43.9). 3. Aortic valvular calcifications. Consider echocardiography to evaluate for valvular dysfunction. Electronically Signed   By: Adria Devon.D.  On: 09/23/2017 13:52    Assessment & Plan:   There are no diagnoses linked to this encounter.   No orders of the defined types were placed in this encounter.    Follow-up: No follow-ups on file.  [name], MD

## 2018-01-14 NOTE — Assessment & Plan Note (Signed)
He will go to [employer]

## 2018-01-14 NOTE — Assessment & Plan Note (Signed)
Metfomin XR, Actos 

## 2018-01-14 NOTE — Assessment & Plan Note (Signed)
Labs

## 2018-03-16 ENCOUNTER — Ambulatory Visit (INDEPENDENT_AMBULATORY_CARE_PROVIDER_SITE_OTHER): Payer: PPO

## 2018-03-16 DIAGNOSIS — Z23 Encounter for immunization: Secondary | ICD-10-CM

## 2018-04-01 ENCOUNTER — Other Ambulatory Visit: Payer: Self-pay | Admitting: Internal Medicine

## 2018-04-11 ENCOUNTER — Other Ambulatory Visit: Payer: Self-pay | Admitting: Internal Medicine

## 2018-04-17 ENCOUNTER — Other Ambulatory Visit: Payer: Self-pay | Admitting: Internal Medicine

## 2018-05-13 ENCOUNTER — Other Ambulatory Visit: Payer: Self-pay | Admitting: Internal Medicine

## 2018-05-15 ENCOUNTER — Other Ambulatory Visit: Payer: Self-pay | Admitting: Internal Medicine

## 2018-05-18 ENCOUNTER — Ambulatory Visit (INDEPENDENT_AMBULATORY_CARE_PROVIDER_SITE_OTHER): Payer: PPO | Admitting: Internal Medicine

## 2018-05-18 ENCOUNTER — Other Ambulatory Visit (INDEPENDENT_AMBULATORY_CARE_PROVIDER_SITE_OTHER): Payer: PPO

## 2018-05-18 ENCOUNTER — Encounter: Payer: Self-pay | Admitting: Internal Medicine

## 2018-05-18 VITALS — BP 126/76 | HR 58 | Temp 98.0°F | Ht 64.0 in | Wt 187.0 lb

## 2018-05-18 DIAGNOSIS — D751 Secondary polycythemia: Secondary | ICD-10-CM | POA: Diagnosis not present

## 2018-05-18 DIAGNOSIS — E785 Hyperlipidemia, unspecified: Secondary | ICD-10-CM

## 2018-05-18 DIAGNOSIS — N320 Bladder-neck obstruction: Secondary | ICD-10-CM

## 2018-05-18 DIAGNOSIS — I10 Essential (primary) hypertension: Secondary | ICD-10-CM | POA: Diagnosis not present

## 2018-05-18 DIAGNOSIS — E119 Type 2 diabetes mellitus without complications: Secondary | ICD-10-CM

## 2018-05-18 DIAGNOSIS — R55 Syncope and collapse: Secondary | ICD-10-CM

## 2018-05-18 DIAGNOSIS — Z0000 Encounter for general adult medical examination without abnormal findings: Secondary | ICD-10-CM

## 2018-05-18 LAB — LIPID PANEL
CHOL/HDL RATIO: 2
Cholesterol: 206 mg/dL — ABNORMAL HIGH (ref 0–200)
HDL: 83.1 mg/dL (ref >39.00)
LDL CALC: 106 mg/dL — AB (ref 0–99)
NonHDL: 122.59
TRIGLYCERIDES: 84 mg/dL (ref 0.0–149.0)
VLDL: 16.8 mg/dL (ref 0.0–40.0)

## 2018-05-18 LAB — URINALYSIS
Bilirubin Urine: NEGATIVE
Hgb urine dipstick: NEGATIVE
Ketones, ur: NEGATIVE
LEUKOCYTES UA: NEGATIVE
NITRITE: NEGATIVE
PH: 6 (ref 5.0–8.0)
SPECIFIC GRAVITY, URINE: 1.025 (ref 1.000–1.030)
Total Protein, Urine: NEGATIVE
Urine Glucose: NEGATIVE
Urobilinogen, UA: 0.2 (ref 0.0–1.0)

## 2018-05-18 LAB — PSA: PSA: 1.11 ng/mL (ref 0.10–4.00)

## 2018-05-18 LAB — BASIC METABOLIC PANEL
BUN: 15 mg/dL (ref 6–23)
CALCIUM: 9.5 mg/dL (ref 8.4–10.5)
CO2: 29 meq/L (ref 19–32)
Chloride: 102 mEq/L (ref 96–112)
Creatinine, Ser: 0.92 mg/dL (ref 0.40–1.50)
GFR: 85.47 mL/min (ref >60.00)
Glucose, Bld: 137 mg/dL — ABNORMAL HIGH (ref 70–99)
POTASSIUM: 4.7 meq/L (ref 3.5–5.1)
SODIUM: 139 meq/L (ref 135–145)

## 2018-05-18 LAB — HEMOGLOBIN A1C: Hgb A1c MFr Bld: 6.7 % — ABNORMAL HIGH (ref 4.6–6.5)

## 2018-05-18 LAB — CBC WITH DIFFERENTIAL/PLATELET
BASOS ABS: 0.1 10*3/uL (ref 0.0–0.1)
Basophils Relative: 1.2 % (ref 0.0–3.0)
EOS PCT: 5.9 % — AB (ref 0.0–5.0)
Eosinophils Absolute: 0.3 10*3/uL (ref 0.0–0.7)
HEMATOCRIT: 48.1 % (ref 39.0–52.0)
Hemoglobin: 16.4 g/dL (ref 13.0–17.0)
LYMPHS PCT: 30.5 % (ref 12.0–46.0)
Lymphs Abs: 1.6 10*3/uL (ref 0.7–4.0)
MCHC: 34.1 g/dL (ref 30.0–36.0)
MCV: 92.2 fl (ref 78.0–100.0)
Monocytes Absolute: 0.6 10*3/uL (ref 0.1–1.0)
Monocytes Relative: 11.5 % (ref 3.0–12.0)
Neutro Abs: 2.7 10*3/uL (ref 1.4–7.7)
Neutrophils Relative %: 50.9 % (ref 43.0–77.0)
Platelets: 205 10*3/uL (ref 150.0–400.0)
RBC: 5.22 Mil/uL (ref 4.22–5.81)
RDW: 13.2 % (ref 11.5–15.5)
WBC: 5.3 10*3/uL (ref 4.0–10.5)

## 2018-05-18 LAB — HEPATIC FUNCTION PANEL
ALBUMIN: 4.1 g/dL (ref 3.5–5.2)
ALT: 17 U/L (ref 0–53)
AST: 16 U/L (ref 0–37)
Alkaline Phosphatase: 78 U/L (ref 39–117)
Bilirubin, Direct: 0.1 mg/dL (ref 0.0–0.3)
Total Bilirubin: 0.5 mg/dL (ref 0.2–1.2)
Total Protein: 6.6 g/dL (ref 6.0–8.3)

## 2018-05-18 LAB — MICROALBUMIN / CREATININE URINE RATIO
Creatinine,U: 108.8 mg/dL
MICROALB UR: 1.4 mg/dL (ref 0.0–1.9)
MICROALB/CREAT RATIO: 1.3 mg/g (ref 0.0–30.0)

## 2018-05-18 LAB — TSH: TSH: 1.27 u[IU]/mL (ref 0.35–4.50)

## 2018-05-18 MED ORDER — HYDROXYZINE HCL 25 MG PO TABS: 25.0000 mg | ORAL_TABLET | Freq: Every evening | ORAL | 1 refills | Status: DC | PRN

## 2018-05-18 NOTE — Assessment & Plan Note (Signed)
Coreg, Amlodipine Labs

## 2018-05-18 NOTE — Assessment & Plan Note (Signed)
Phlebotomies if needed

## 2018-05-18 NOTE — Assessment & Plan Note (Signed)
Hydroxyzine at hs

## 2018-05-18 NOTE — Patient Instructions (Signed)

## 2018-05-18 NOTE — Assessment & Plan Note (Signed)
No relapse 

## 2018-05-18 NOTE — Progress Notes (Signed)
Subjective:  Patient ID: Chase Mage., male    DOB: [birth year]  Age: 72 y.o. MRN: [id number]  CC: No chief complaint on file.   HPI [name]. presents for a well exam  Outpatient Medications Prior to Visit  Medication Sig Dispense Refill  . acetaminophen (TYLENOL) 650 MG CR tablet Take 650 mg by mouth 2 (two) times daily as needed for pain.     [name] ALPRAZolam (XANAX) 0.5 MG tablet TAKE 1 TO 2 TABLETS BY MOUTH AT BEDTIME AS NEEDED FOR ANXIETY OR SLEEP 180 tablet 1  . amLODipine (NORVASC) 10 MG tablet TAKE 1 TABLET (10 MG TOTAL) BY MOUTH DAILY BEFORE BREAKFAST. 90 tablet 0  . aspirin EC 81 MG tablet Take 81 mg by mouth daily.    . carvedilol (COREG) 25 MG tablet TAKE 1 TABLET BY MOUTH TWICE A DAY WITH MEALS 180 tablet 3  . Cholecalciferol (VITAMIN D3) 1000 UNITS tablet Take 2,000 Units by mouth daily.     . Coenzyme Q10 (CO Q-10) 200 MG CAPS Take 400 mg by mouth daily.     [name] doxepin (SINEQUAN) 10 MG capsule TAKE 1-2 CAPSULES (10-20 MG TOTAL) BY MOUTH AT BEDTIME AS NEEDED. 180 capsule 3  . Folic Acid 0.8 MG CAPS Take 2 capsules by mouth every morning.  30 each 0  . glucose blood test strip USE TO TEST BLOOD SUGAR TWICE A DAY 100 each 3  . hydrOXYzine (ATARAX/VISTARIL) 25 MG tablet TAKE 1-2 TABLETS (25-50 MG TOTAL) BY MOUTH AT BEDTIME AS NEEDED (INSOMNIA). 60 tablet 0  . KRILL OIL PO Take by mouth.    . metFORMIN (GLUCOPHAGE-XR) 750 MG 24 hr tablet TAKE 1 TABLET BY MOUTH EVERY DAY WITH BREAKFAST 90 tablet 3  . ONETOUCH DELICA LANCETS 50N MISC Use to check blood sugar twice a day. DX: E11.9 100 each 3  . pioglitazone (ACTOS) 45 MG tablet Take 1 tablet (45 mg total) by mouth every other day. 45 tablet 3  . Pitavastatin Calcium 2 MG TABS Take 1 tablet (2 mg total) by mouth daily. 30 tablet 11  . traMADol (ULTRAM) 50 MG tablet Take by mouth every 6 (six) hours as needed.    . traZODone (DESYREL) 50 MG tablet Take 1-2 tablets (50-100 mg total) by mouth at bedtime as needed for  sleep. 180 tablet 1  . amLODipine (NORVASC) 10 MG tablet Take 1 tablet (10 mg total) by mouth daily before breakfast. (Patient taking differently: Take 5 mg by mouth daily before breakfast. ) 90 tablet 3   No facility-administered medications prior to visit.     ROS: Review of Systems  Constitutional: Negative for appetite change, fatigue and unexpected weight change.  HENT: Negative for congestion, nosebleeds, sneezing, sore throat and trouble swallowing.   Eyes: Negative for itching and visual disturbance.  Respiratory: Positive for cough.   Cardiovascular: Negative for chest pain, palpitations and leg swelling.  Gastrointestinal: Negative for abdominal distention, blood in stool, diarrhea and nausea.  Genitourinary: Negative for frequency and hematuria.  Musculoskeletal: Negative for back pain, gait problem, joint swelling and neck pain.  Skin: Negative for rash.  Neurological: Negative for dizziness, tremors, syncope, speech difficulty and weakness.  Psychiatric/Behavioral: Negative for agitation, dysphoric mood and sleep disturbance. The patient is not nervous/anxious.     Objective:  BP 126/76 (BP Location: Left Arm, Patient Position: Sitting, Cuff Size: Normal)   Pulse (!) 58   Temp 98 F (36.7 C) (Oral)   Ht 5'  4" (1.626 m)   Wt 187 lb (84.8 kg)   SpO2 95%   BMI 32.10 kg/m   BP Readings from Last 3 Encounters:  05/18/18 126/76  01/14/18 122/66  10/15/17 124/72    Wt Readings from Last 3 Encounters:  05/18/18 187 lb (84.8 kg)  01/14/18 190 lb (86.2 kg)  10/15/17 188 lb (85.3 kg)    Physical Exam Constitutional:      General: He is not in acute distress.    Appearance: He is well-developed.     Comments: NAD  Eyes:     Conjunctiva/sclera: Conjunctivae normal.     Pupils: Pupils are equal, round, and reactive to light.  Neck:     Musculoskeletal: Normal range of motion.     Thyroid: No thyromegaly.     Vascular: No JVD.  Cardiovascular:     Rate and  Rhythm: Normal rate and regular rhythm.     Heart sounds: Normal heart sounds. No murmur. No friction rub. No gallop.   Pulmonary:     Effort: Pulmonary effort is normal. No respiratory distress.     Breath sounds: Normal breath sounds. No wheezing or rales.  Chest:     Chest wall: No tenderness.  Abdominal:     General: Bowel sounds are normal. There is no distension.     Palpations: Abdomen is soft. There is no mass.     Tenderness: There is no abdominal tenderness. There is no guarding or rebound.  Genitourinary:    Penis: Normal.      Scrotum/Testes: Normal.     Prostate: Normal.     Rectum: Normal. Guaiac result negative.  Musculoskeletal: Normal range of motion.        General: No tenderness.  Lymphadenopathy:     Cervical: No cervical adenopathy.  Skin:    General: Skin is warm and dry.     Findings: No rash.  Neurological:     Mental Status: He is alert and oriented to person, place, and time.     Cranial Nerves: No cranial nerve deficit.     Motor: No abnormal muscle tone.     Coordination: Coordination normal.     Gait: Gait normal.     Deep Tendon Reflexes: Reflexes are normal and symmetric.  Psychiatric:        Behavior: Behavior normal.        Thought Content: Thought content normal.        Judgment: Judgment normal.      Lab Results  Component Value Date   WBC 5.4 01/07/2018   HGB 16.2 01/07/2018   HCT 46.9 01/07/2018   PLT 212.0 01/07/2018   GLUCOSE 134 (H) 01/07/2018   CHOL 218 (H) 04/08/2017   TRIG 79.0 04/08/2017   HDL 91.70 04/08/2017   LDLCALC 111 (H) 04/08/2017   ALT 17 04/08/2017   AST 17 04/08/2017   NA 137 01/07/2018   K 4.2 01/07/2018   CL 101 01/07/2018   CREATININE 1.00 01/07/2018   BUN 17 01/07/2018   CO2 27 01/07/2018   TSH 1.95 04/08/2017   PSA 1.25 04/08/2017   HGBA1C 7.0 (H) 01/07/2018   MICROALBUR 1.5 08/02/2015    Ct Chest Lung Ca Screen Low Dose W/o Cm  Result Date: 09/23/2017 CLINICAL DATA:  Sixty pack-year smoking  history. Ex-smoker, quitting 15 years ago. Asymptomatic. EXAM: CT CHEST WITHOUT CONTRAST LOW-DOSE FOR LUNG CANCER SCREENING TECHNIQUE: Multidetector CT imaging of the chest was performed following the standard protocol without IV contrast. COMPARISON:  07/09/2016 FINDINGS: Cardiovascular: Aortic and branch vessel atherosclerosis. Normal heart size, without pericardial effusion. Multivessel coronary artery atherosclerosis. Aortic valve calcification. Mediastinum/Nodes: No mediastinal or definite hilar adenopathy, given limitations of unenhanced CT. Lungs/Pleura: No pleural fluid. Mild paraseptal and centrilobular emphysema. Bilateral tiny pulmonary nodules are not significantly changed. No new or enlarging pulmonary nodules identified. Upper Abdomen: Normal imaged portions of the liver, spleen, stomach, pancreas, adrenal glands, kidneys. Musculoskeletal: Moderate thoracic spondylosis. IMPRESSION: 1. Lung-RADS 2, benign appearance or behavior. Continue annual screening with low-dose chest CT without contrast in 12 months. 2. Aortic atherosclerosis (ICD10-I70.0), coronary artery atherosclerosis and emphysema (ICD10-J43.9). 3. Aortic valvular calcifications. Consider echocardiography to evaluate for valvular dysfunction. Electronically Signed   By: Abigail Miyamoto M.D.   On: 09/23/2017 13:52    Assessment & Plan:   There are no diagnoses linked to this encounter.   No orders of the defined types were placed in this encounter.    Follow-up: No follow-ups on file.  [name], MD

## 2018-05-18 NOTE — Assessment & Plan Note (Addendum)
Here for medicare wellness/physical  Diet: heart healthy  Physical activity: not sedentary  Depression/mood screen: negative  Hearing: decreased Visual acuity: grossly normal - reading glasses, performs annual eye exam  ADLs: capable  Fall risk: low to none  Home safety: good  Cognitive evaluation: intact to orientation, naming, recall and repetition  EOL planning: adv directives, full code/ I agree  I have personally reviewed and have noted  1. The patient's medical, surgical and social history  2. Their use of alcohol, tobacco or illicit drugs  3. Their current medications and supplements  4. The patient's functional ability including ADL's, fall risks, home safety risks and hearing or visual impairment.  5. Diet and physical activities  6. Evidence for depression or mood disorders 7. The roster of all physicians providing medical care to patient - is listed in the Snapshot section of the chart and reviewed today.    Today patient counseled on age appropriate routine health concerns for screening and prevention, each reviewed and up to date or declined. Immunizations reviewed and up to date or declined. Labs ordered and reviewed. Risk factors for depression reviewed and negative. Hearing function and visual acuity are intact. ADLs screened and addressed as needed. Functional ability and level of safety reviewed and appropriate. Education, counseling and referrals performed based on assessed risks today. Patient provided with a copy of personalized plan for preventive services.   Colon due 2022

## 2018-06-17 ENCOUNTER — Other Ambulatory Visit: Payer: Self-pay | Admitting: Internal Medicine

## 2018-06-23 ENCOUNTER — Other Ambulatory Visit: Payer: Self-pay | Admitting: Internal Medicine

## 2018-08-10 DIAGNOSIS — E119 Type 2 diabetes mellitus without complications: Secondary | ICD-10-CM | POA: Diagnosis not present

## 2018-08-10 DIAGNOSIS — H2513 Age-related nuclear cataract, bilateral: Secondary | ICD-10-CM | POA: Diagnosis not present

## 2018-08-10 DIAGNOSIS — H35372 Puckering of macula, left eye: Secondary | ICD-10-CM | POA: Diagnosis not present

## 2018-08-10 DIAGNOSIS — H3589 Other specified retinal disorders: Secondary | ICD-10-CM | POA: Diagnosis not present

## 2018-08-10 DIAGNOSIS — H353121 Nonexudative age-related macular degeneration, left eye, early dry stage: Secondary | ICD-10-CM | POA: Diagnosis not present

## 2018-08-19 MED ORDER — ONETOUCH VERIO W/DEVICE KIT: PACK | 0 refills | Status: DC

## 2018-08-20 ENCOUNTER — Other Ambulatory Visit: Payer: Self-pay | Admitting: Internal Medicine

## 2018-09-14 ENCOUNTER — Encounter: Payer: Self-pay | Admitting: Internal Medicine

## 2018-09-14 ENCOUNTER — Ambulatory Visit (INDEPENDENT_AMBULATORY_CARE_PROVIDER_SITE_OTHER): Payer: PPO | Admitting: Internal Medicine

## 2018-09-14 DIAGNOSIS — E119 Type 2 diabetes mellitus without complications: Secondary | ICD-10-CM

## 2018-09-14 DIAGNOSIS — E785 Hyperlipidemia, unspecified: Secondary | ICD-10-CM

## 2018-09-14 DIAGNOSIS — Z122 Encounter for screening for malignant neoplasm of respiratory organs: Secondary | ICD-10-CM

## 2018-09-14 DIAGNOSIS — Z87891 Personal history of nicotine dependence: Secondary | ICD-10-CM | POA: Diagnosis not present

## 2018-09-14 DIAGNOSIS — I10 Essential (primary) hypertension: Secondary | ICD-10-CM

## 2018-09-14 NOTE — Assessment & Plan Note (Signed)
Screening with chest CT yearly

## 2018-09-14 NOTE — Assessment & Plan Note (Signed)
Continue with metformin, Actos

## 2018-09-14 NOTE — Assessment & Plan Note (Signed)
Continue with Coreg and amlodipine

## 2018-09-14 NOTE — Assessment & Plan Note (Signed)
Livalo daily

## 2018-09-14 NOTE — Progress Notes (Signed)
Virtual Visit via Telephone Note  I connected with Lars Mage. on 09/14/18 at  9:10 AM EDT by telephone and verified that I am speaking with the correct person using two identifiers.   I discussed the limitations, risks, security and privacy concerns of performing an evaluation and management service by telephone and the availability of in person appointments. I also discussed with the patient that there may be a patient responsible charge related to this service. The patient expressed understanding and agreed to proceed.   History of Present Illness:   Follow-up on diabetes, hypertension, dyslipidemia Observations/Objective:  General looks well.  His blood pressure today was 144/92.  Normal he has a better blood pressure.  His sugars have been in the more or less normal. Assessment and Plan: See plan  Follow Up Instructions:    I discussed the assessment and treatment plan with the patient. The patient was provided an opportunity to ask questions and all were answered. The patient agreed with the plan and demonstrated an understanding of the instructions.   The patient was advised to call back or seek an in-person evaluation if the symptoms worsen or if the condition fails to improve as anticipated.  I provided 20 minutes of non-face-to-face time during this encounter.   [name], MD

## 2018-10-23 ENCOUNTER — Inpatient Hospital Stay: Admission: RE | Admit: 2018-10-23 | Payer: PPO | Source: Ambulatory Visit

## 2018-11-11 ENCOUNTER — Telehealth: Payer: Self-pay | Admitting: Internal Medicine

## 2018-11-11 NOTE — Telephone Encounter (Signed)
Pt called stating he was advised by [facility] in [place] that all lots of metformin 500mg  and 750mg  are recalled and he needs to contact doctor office for change of medication. Pt was advised not to take RX he just picked up. Please call back.

## 2018-11-11 NOTE — Telephone Encounter (Signed)
Please advise 

## 2018-11-11 NOTE — Telephone Encounter (Signed)
Does he need to obtain metformin from a different pharmacy?  Did he is [facility] suggest way to get metformin from a good lot from? Thanks

## 2018-11-12 ENCOUNTER — Other Ambulatory Visit: Payer: Self-pay | Admitting: Internal Medicine

## 2018-11-12 NOTE — Telephone Encounter (Signed)
Patient calling in checking on status of getting something else called in for his metformin.

## 2018-11-12 NOTE — Telephone Encounter (Signed)
All lots at all pharmacies for the 500 and 750 have been recalled and are unavailable

## 2018-11-13 NOTE — Telephone Encounter (Signed)
I take metformin 500 mg a day from [place].  Has it being the recalled to?  If it has not been recalled, can [name] get his metformin from [facility]? Thx

## 2018-11-15 ENCOUNTER — Other Ambulatory Visit: Payer: Self-pay | Admitting: Internal Medicine

## 2018-11-16 NOTE — Telephone Encounter (Signed)
See my chart message

## 2018-12-01 ENCOUNTER — Telehealth: Payer: Self-pay | Admitting: *Deleted

## 2018-12-01 NOTE — Telephone Encounter (Signed)

## 2018-12-02 ENCOUNTER — Other Ambulatory Visit: Payer: Self-pay

## 2018-12-02 ENCOUNTER — Ambulatory Visit (INDEPENDENT_AMBULATORY_CARE_PROVIDER_SITE_OTHER)
Admission: RE | Admit: 2018-12-02 | Discharge: 2018-12-02 | Disposition: A | Discharge status: Home / Self Care | Payer: PPO | Source: Ambulatory Visit | Attending: Internal Medicine | Admitting: Internal Medicine

## 2018-12-02 DIAGNOSIS — Z122 Encounter for screening for malignant neoplasm of respiratory organs: Secondary | ICD-10-CM

## 2018-12-02 DIAGNOSIS — J432 Centrilobular emphysema: Secondary | ICD-10-CM | POA: Diagnosis not present

## 2018-12-02 DIAGNOSIS — Z87891 Personal history of nicotine dependence: Secondary | ICD-10-CM | POA: Diagnosis not present

## 2018-12-11 DIAGNOSIS — H2513 Age-related nuclear cataract, bilateral: Secondary | ICD-10-CM | POA: Diagnosis not present

## 2018-12-11 DIAGNOSIS — H43823 Vitreomacular adhesion, bilateral: Secondary | ICD-10-CM | POA: Diagnosis not present

## 2018-12-11 DIAGNOSIS — E119 Type 2 diabetes mellitus without complications: Secondary | ICD-10-CM | POA: Diagnosis not present

## 2019-01-06 DIAGNOSIS — E119 Type 2 diabetes mellitus without complications: Secondary | ICD-10-CM | POA: Diagnosis not present

## 2019-01-21 DIAGNOSIS — Z23 Encounter for immunization: Secondary | ICD-10-CM | POA: Diagnosis not present

## 2019-01-27 ENCOUNTER — Other Ambulatory Visit: Payer: Self-pay | Admitting: Internal Medicine

## 2019-01-27 ENCOUNTER — Other Ambulatory Visit (INDEPENDENT_AMBULATORY_CARE_PROVIDER_SITE_OTHER): Payer: PPO

## 2019-01-27 DIAGNOSIS — R300 Dysuria: Secondary | ICD-10-CM

## 2019-01-27 LAB — URINALYSIS
Bilirubin Urine: NEGATIVE
Hgb urine dipstick: NEGATIVE
Ketones, ur: NEGATIVE
Leukocytes,Ua: NEGATIVE
Nitrite: NEGATIVE
Specific Gravity, Urine: 1.02 (ref 1.000–1.030)
Total Protein, Urine: NEGATIVE
Urine Glucose: NEGATIVE
Urobilinogen, UA: 0.2 (ref 0.0–1.0)
pH: 7 (ref 5.0–8.0)

## 2019-01-29 ENCOUNTER — Telehealth: Payer: Self-pay | Admitting: *Deleted

## 2019-01-29 LAB — CULTURE, URINE COMPREHENSIVE
MICRO NUMBER:: 839552
SPECIMEN QUALITY:: ADEQUATE

## 2019-01-29 NOTE — Telephone Encounter (Signed)
Patient's wife informed

## 2019-01-29 NOTE — Telephone Encounter (Signed)
Urine cx is still pending.

## 2019-03-04 ENCOUNTER — Telehealth: Payer: Self-pay | Admitting: Internal Medicine

## 2019-03-04 NOTE — Telephone Encounter (Signed)
Lilia Pro calling from [employer] to see if [facility] calling to see if [facility] has faxed A1C results for the patient? Please advise [phone]  If the the results have not received please contact Lilia Pro.   If results have been received the patient is request results to be mailed to him. Thank you

## 2019-03-05 NOTE — Telephone Encounter (Signed)
LM asking if she was trying to get a hold of [facility] since it was sent to them last time and that I havent received any faxes

## 2019-03-05 NOTE — Telephone Encounter (Signed)
[name] called again to check on the A1C results for the Pt/ please advise

## 2019-05-11 ENCOUNTER — Other Ambulatory Visit: Payer: Self-pay | Admitting: Internal Medicine

## 2019-05-17 ENCOUNTER — Other Ambulatory Visit: Payer: Self-pay | Admitting: Internal Medicine

## 2019-05-20 NOTE — Telephone Encounter (Signed)
Done erx 

## 2019-05-20 NOTE — Telephone Encounter (Signed)
Pt called for an update on the Rx refill request. Pt stated he only has 3 pills left and would like the Rx sent to his pharmacy

## 2019-05-29 ENCOUNTER — Other Ambulatory Visit: Payer: Self-pay | Admitting: Internal Medicine

## 2019-06-10 ENCOUNTER — Other Ambulatory Visit: Payer: Self-pay | Admitting: Internal Medicine

## 2019-06-16 ENCOUNTER — Other Ambulatory Visit: Payer: Self-pay

## 2019-06-16 ENCOUNTER — Ambulatory Visit (INDEPENDENT_AMBULATORY_CARE_PROVIDER_SITE_OTHER): Payer: PPO | Admitting: Internal Medicine

## 2019-06-16 ENCOUNTER — Encounter: Payer: Self-pay | Admitting: Internal Medicine

## 2019-06-16 VITALS — BP 128/82 | HR 69 | Temp 98.2°F | Ht 64.0 in | Wt 189.0 lb

## 2019-06-16 DIAGNOSIS — M5440 Lumbago with sciatica, unspecified side: Secondary | ICD-10-CM

## 2019-06-16 DIAGNOSIS — I2510 Atherosclerotic heart disease of native coronary artery without angina pectoris: Secondary | ICD-10-CM

## 2019-06-16 DIAGNOSIS — I10 Essential (primary) hypertension: Secondary | ICD-10-CM

## 2019-06-16 DIAGNOSIS — E119 Type 2 diabetes mellitus without complications: Secondary | ICD-10-CM

## 2019-06-16 DIAGNOSIS — J45909 Unspecified asthma, uncomplicated: Secondary | ICD-10-CM

## 2019-06-16 DIAGNOSIS — J449 Chronic obstructive pulmonary disease, unspecified: Secondary | ICD-10-CM | POA: Diagnosis not present

## 2019-06-16 DIAGNOSIS — N320 Bladder-neck obstruction: Secondary | ICD-10-CM

## 2019-06-16 DIAGNOSIS — E785 Hyperlipidemia, unspecified: Secondary | ICD-10-CM

## 2019-06-16 DIAGNOSIS — G8929 Other chronic pain: Secondary | ICD-10-CM | POA: Diagnosis not present

## 2019-06-16 DIAGNOSIS — I2584 Coronary atherosclerosis due to calcified coronary lesion: Secondary | ICD-10-CM

## 2019-06-16 DIAGNOSIS — Z0000 Encounter for general adult medical examination without abnormal findings: Secondary | ICD-10-CM

## 2019-06-16 LAB — BASIC METABOLIC PANEL WITH GFR
BUN: 15 mg/dL (ref 6–23)
CO2: 28 meq/L (ref 19–32)
Calcium: 9.5 mg/dL (ref 8.4–10.5)
Chloride: 101 meq/L (ref 96–112)
Creatinine, Ser: 0.87 mg/dL (ref 0.40–1.50)
GFR: 85.52 mL/min
Glucose, Bld: 136 mg/dL — ABNORMAL HIGH (ref 70–99)
Potassium: 4.4 meq/L (ref 3.5–5.1)
Sodium: 137 meq/L (ref 135–145)

## 2019-06-16 LAB — URINALYSIS
Bilirubin Urine: NEGATIVE
Hgb urine dipstick: NEGATIVE
Ketones, ur: NEGATIVE
Leukocytes,Ua: NEGATIVE
Nitrite: NEGATIVE
Specific Gravity, Urine: 1.02 (ref 1.000–1.030)
Total Protein, Urine: NEGATIVE
Urine Glucose: NEGATIVE
Urobilinogen, UA: 0.2 (ref 0.0–1.0)
pH: 6.5 (ref 5.0–8.0)

## 2019-06-16 LAB — CBC WITH DIFFERENTIAL/PLATELET
Basophils Absolute: 0 10*3/uL (ref 0.0–0.1)
Basophils Relative: 0.8 % (ref 0.0–3.0)
Eosinophils Absolute: 0.3 10*3/uL (ref 0.0–0.7)
Eosinophils Relative: 5.2 % — ABNORMAL HIGH (ref 0.0–5.0)
HCT: 47.1 % (ref 39.0–52.0)
Hemoglobin: 15.6 g/dL (ref 13.0–17.0)
Lymphocytes Relative: 28.1 % (ref 12.0–46.0)
Lymphs Abs: 1.5 10*3/uL (ref 0.7–4.0)
MCHC: 33.2 g/dL (ref 30.0–36.0)
MCV: 92.9 fl (ref 78.0–100.0)
Monocytes Absolute: 0.7 10*3/uL (ref 0.1–1.0)
Monocytes Relative: 12.3 % — ABNORMAL HIGH (ref 3.0–12.0)
Neutro Abs: 2.9 10*3/uL (ref 1.4–7.7)
Neutrophils Relative %: 53.6 % (ref 43.0–77.0)
Platelets: 207 10*3/uL (ref 150.0–400.0)
RBC: 5.07 Mil/uL (ref 4.22–5.81)
RDW: 14.1 % (ref 11.5–15.5)
WBC: 5.5 10*3/uL (ref 4.0–10.5)

## 2019-06-16 LAB — HEPATIC FUNCTION PANEL
ALT: 17 U/L (ref 0–53)
AST: 18 U/L (ref 0–37)
Albumin: 4.1 g/dL (ref 3.5–5.2)
Alkaline Phosphatase: 93 U/L (ref 39–117)
Bilirubin, Direct: 0.1 mg/dL (ref 0.0–0.3)
Total Bilirubin: 0.6 mg/dL (ref 0.2–1.2)
Total Protein: 6.6 g/dL (ref 6.0–8.3)

## 2019-06-16 LAB — LIPID PANEL
Cholesterol: 211 mg/dL — ABNORMAL HIGH (ref 0–200)
HDL: 88.1 mg/dL
LDL Cholesterol: 106 mg/dL — ABNORMAL HIGH (ref 0–99)
NonHDL: 122.68
Total CHOL/HDL Ratio: 2
Triglycerides: 83 mg/dL (ref 0.0–149.0)
VLDL: 16.6 mg/dL (ref 0.0–40.0)

## 2019-06-16 LAB — TSH: TSH: 1.66 u[IU]/mL (ref 0.35–4.50)

## 2019-06-16 LAB — PSA: PSA: 0.95 ng/mL (ref 0.10–4.00)

## 2019-06-16 LAB — HEMOGLOBIN A1C: Hgb A1c MFr Bld: 6.5 % (ref 4.6–6.5)

## 2019-06-16 NOTE — Progress Notes (Signed)
Subjective:  Patient ID: Chase Mage., male    DOB: 1946/02/11  Age: 74 y.o. MRN: [id number]  CC: No chief complaint on file.   HPI Chase Koppel. presents for DM, HTN, CAD f/u Foot exam F/u on LDCT for h/o smoking  Outpatient Medications Prior to Visit  Medication Sig Dispense Refill  . acetaminophen (TYLENOL) 650 MG CR tablet Take 650 mg by mouth 2 (two) times daily as needed for pain.     [name] ALPRAZolam (XANAX) 0.5 MG tablet TAKE 1 TO 2 TABLETS BY MOUTH AT BEDTIME AS NEEDED FOR ANXIETY OR SLEEP 180 tablet 1  . amLODipine (NORVASC) 10 MG tablet TAKE 1 TABLET (10 MG TOTAL) BY MOUTH DAILY BEFORE BREAKFAST. 90 tablet 3  . aspirin EC 81 MG tablet Take 81 mg by mouth daily.    . carvedilol (COREG) 25 MG tablet TAKE 1 TABLET BY MOUTH TWICE A DAY WITH MEALS 180 tablet 3  . Cholecalciferol (VITAMIN D3) 1000 UNITS tablet Take 2,000 Units by mouth daily.     . Coenzyme Q10 (CO Q-10) 200 MG CAPS Take 400 mg by mouth daily.     [name] doxepin (SINEQUAN) 10 MG capsule TAKE 1-2 CAPSULES (10-20 MG TOTAL) BY MOUTH AT BEDTIME AS NEEDED. 180 capsule 3  . Folic Acid 0.8 MG CAPS Take 2 capsules by mouth every morning.  30 each 0  . glucose blood test strip USE TO TEST BLOOD SUGAR TWICE A DAY 100 each 3  . hydrOXYzine (ATARAX/VISTARIL) 25 MG tablet Take 1-2 tablets (25-50 mg total) by mouth at bedtime as needed (insomnia). Annual appt due in April must see provider for future refills 180 tablet 0  . KRILL OIL PO Take by mouth.    . metFORMIN (GLUCOPHAGE-XR) 750 MG 24 hr tablet TAKE 1 TABLET BY MOUTH EVERY DAY WITH BREAKFAST 90 tablet 0  . OneTouch Delica Lancets 99991111 MISC USE TO CHECK BLOOD SUGAR TWICE DAILY 100 each 3  . ONETOUCH VERIO test strip USE TO CHECK BLOOD SUGAR DAILY 100 strip 5  . pioglitazone (ACTOS) 45 MG tablet TAKE 1 TABLET (45 MG TOTAL) BY MOUTH EVERY OTHER DAY. 45 tablet 3  . Pitavastatin Calcium 2 MG TABS Take 1 tablet (2 mg total) by mouth daily. 30 tablet 11  . traMADol  (ULTRAM) 50 MG tablet Take by mouth every 6 (six) hours as needed.    . traZODone (DESYREL) 50 MG tablet Take 1-2 tablets (50-100 mg total) by mouth at bedtime as needed for sleep. 180 tablet 1  . amLODipine (NORVASC) 10 MG tablet Take 1 tablet (10 mg total) by mouth daily before breakfast. (Patient taking differently: Take 5 mg by mouth daily before breakfast. ) 90 tablet 3  . Multiple Vitamins-Minerals (PRESERVISION AREDS 2+MULTI VIT PO)      No facility-administered medications prior to visit.    ROS: Review of Systems  Constitutional: Negative for appetite change, fatigue and unexpected weight change.  HENT: Negative for congestion, nosebleeds, sneezing, sore throat and trouble swallowing.   Eyes: Negative for itching and visual disturbance.  Respiratory: Negative for cough.   Cardiovascular: Negative for chest pain, palpitations and leg swelling.  Gastrointestinal: Negative for abdominal distention, blood in stool, diarrhea and nausea.  Genitourinary: Negative for frequency and hematuria.  Musculoskeletal: Negative for back pain, gait problem, joint swelling and neck pain.  Skin: Negative for rash.  Neurological: Negative for dizziness, tremors, speech difficulty and weakness.  Psychiatric/Behavioral: Negative for agitation, dysphoric mood, sleep  disturbance and suicidal ideas. The patient is not nervous/anxious.     Objective:  BP 128/82 (BP Location: Left Arm, Patient Position: Sitting, Cuff Size: Normal)   Pulse 69   Temp 98.2 F (36.8 C) (Oral)   Ht 5\' 4"  (1.626 m)   Wt 189 lb (85.7 kg)   SpO2 97%   BMI 32.44 kg/m   BP Readings from Last 3 Encounters:  06/16/19 128/82  05/18/18 126/76  01/14/18 122/66    Wt Readings from Last 3 Encounters:  06/16/19 189 lb (85.7 kg)  05/18/18 187 lb (84.8 kg)  01/14/18 190 lb (86.2 kg)    Physical Exam Constitutional:      General: [name] is not in acute distress.    Appearance: [name] is well-developed. [name] is obese.     Comments:  NAD  Eyes:     Conjunctiva/sclera: Conjunctivae normal.     Pupils: Pupils are equal, round, and reactive to light.  Neck:     Thyroid: No thyromegaly.     Vascular: No JVD.  Cardiovascular:     Rate and Rhythm: Normal rate and regular rhythm.     Heart sounds: Normal heart sounds. No murmur. No friction rub. No gallop.   Pulmonary:     Effort: Pulmonary effort is normal. No respiratory distress.     Breath sounds: Normal breath sounds. No wheezing or rales.  Chest:     Chest wall: No tenderness.  Abdominal:     General: Bowel sounds are normal. There is no distension.     Palpations: Abdomen is soft. There is no mass.     Tenderness: There is no abdominal tenderness. There is no guarding or rebound.  Musculoskeletal:        General: No tenderness. Normal range of motion.     Cervical back: Normal range of motion.  Lymphadenopathy:     Cervical: No cervical adenopathy.  Skin:    General: Skin is warm and dry.     Findings: No rash.  Neurological:     Mental Status: [name] is alert and oriented to person, place, and time.     Cranial Nerves: No cranial nerve deficit.     Motor: No abnormal muscle tone.     Coordination: Coordination normal.     Gait: Gait normal.     Deep Tendon Reflexes: Reflexes are normal and symmetric.  Psychiatric:        Behavior: Behavior normal.        Thought Content: Thought content normal.        Judgment: Judgment normal.   LS stiff Pt declined rectal  Lab Results  Component Value Date   WBC 5.3 05/18/2018   HGB 16.4 05/18/2018   HCT 48.1 05/18/2018   PLT 205.0 05/18/2018   GLUCOSE 137 (H) 05/18/2018   CHOL 206 (H) 05/18/2018   TRIG 84.0 05/18/2018   HDL 83.10 05/18/2018   LDLCALC 106 (H) 05/18/2018   ALT 17 05/18/2018   AST 16 05/18/2018   NA 139 05/18/2018   K 4.7 05/18/2018   CL 102 05/18/2018   CREATININE 0.92 05/18/2018   BUN 15 05/18/2018   CO2 29 05/18/2018   TSH 1.27 05/18/2018   PSA 1.11 05/18/2018   HGBA1C 6.7 (H)  05/18/2018   MICROALBUR 1.4 05/18/2018    CT CHEST WO CONTRAST  Result Date: 12/03/2018 CLINICAL DATA:  Former smoker, quit in 2004 (greater than 15 years prior). Lung cancer screening study. Asymptomatic. EXAM: CT CHEST WITHOUT CONTRAST TECHNIQUE:  Multidetector CT imaging of the chest was performed following the standard protocol without IV contrast. COMPARISON:  09/23/2017 screening chest CT. FINDINGS: Cardiovascular: Normal heart size. No significant pericardial effusion/thickening. Three-vessel coronary atherosclerosis. Atherosclerotic nonaneurysmal thoracic aorta. Normal caliber pulmonary arteries. Mediastinum/Nodes: No discrete thyroid nodules. Unremarkable esophagus. No pathologically enlarged axillary, mediastinal or hilar lymph nodes, noting limited sensitivity for the detection of hilar adenopathy on this noncontrast study. Lungs/Pleura: No pneumothorax. No pleural effusion. Mild centrilobular and paraseptal emphysema. No acute consolidative airspace disease or lung masses. Two tiny scattered solid pulmonary nodules, largest 3 mm in the left lower lobe (series 3/image 93), both stable. No new significant pulmonary nodules. Upper abdomen: No acute abnormality. Musculoskeletal: No aggressive appearing focal osseous lesions. Moderate thoracic spondylosis. IMPRESSION: 1. No active pulmonary disease. No lung masses or new significant pulmonary nodules. 2. Three-vessel coronary atherosclerosis. Aortic Atherosclerosis (ICD10-I70.0) and Emphysema (ICD10-J43.9). Electronically Signed   By: Ilona Sorrel M.D.   On: 12/03/2018 08:12    Assessment & Plan:   There are no diagnoses linked to this encounter.   No orders of the defined types were placed in this encounter.    Follow-up: No follow-ups on file.  [name], MD

## 2019-06-16 NOTE — Assessment & Plan Note (Signed)
-   Crestor 

## 2019-06-16 NOTE — Assessment & Plan Note (Signed)
On Coreg, Amlodipine

## 2019-06-16 NOTE — Assessment & Plan Note (Signed)
We discussed age appropriate health related issues, including available/recomended screening tests and vaccinations. We discussed a need for adhering to healthy diet and exercise. Labs were ordered to be later reviewed . All questions were answered.  Colon due 2022

## 2019-06-16 NOTE — Assessment & Plan Note (Signed)
LDCT chest

## 2019-06-16 NOTE — Assessment & Plan Note (Signed)
switch from Chama (too $$) and start Metfomin XR, cont Actos

## 2019-06-16 NOTE — Assessment & Plan Note (Signed)
On Pravachol 

## 2019-06-16 NOTE — Assessment & Plan Note (Signed)
Chronic OA 

## 2019-06-23 ENCOUNTER — Ambulatory Visit: Payer: PPO

## 2019-06-26 ENCOUNTER — Other Ambulatory Visit: Payer: Self-pay | Admitting: Internal Medicine

## 2019-07-02 ENCOUNTER — Ambulatory Visit: Payer: PPO | Attending: Internal Medicine

## 2019-07-02 DIAGNOSIS — Z23 Encounter for immunization: Secondary | ICD-10-CM

## 2019-07-02 NOTE — Progress Notes (Signed)
   [id number]  Name:  Delawrence Portis.    MRN: [id number] DOB: 06/18/1945  07/02/2019  Mr. Kazi was observed post Covid-19 immunization for 15 minutes without incidence. He was provided with Vaccine Information Sheet and instruction to access the V-Safe system.   Mr. Behning was instructed to call 911 with any severe reactions post vaccine: [name] Difficulty breathing  . Swelling of your face and throat  . A fast heartbeat  . A bad rash all over your body  . Dizziness and weakness    Immunizations Administered    Name Date Dose VIS Date Route   Pfizer COVID-19 Vaccine 07/02/2019  1:45 PM 0.3 mL 05/07/2019 Intramuscular   Manufacturer: [place]   Lot: [id number]   [place]: [id number]

## 2019-07-27 ENCOUNTER — Ambulatory Visit: Payer: PPO | Attending: Internal Medicine

## 2019-07-27 DIAGNOSIS — Z23 Encounter for immunization: Secondary | ICD-10-CM | POA: Insufficient documentation

## 2019-07-27 NOTE — Progress Notes (Signed)
   [id number]  Name:  Juda Haataja.    MRN: [id number] DOB: [birth year]  07/27/2019  Mr. Nickol was observed post Covid-19 immunization for 15 minutes without incident. He was provided with Vaccine Information Sheet and instruction to access the V-Safe system.   Mr. Flam was instructed to call 911 with any severe reactions post vaccine: [name] Difficulty breathing  . Swelling of face and throat  . A fast heartbeat  . A bad rash all over body  . Dizziness and weakness   Immunizations Administered    Name Date Dose VIS Date Route   Pfizer COVID-19 Vaccine 07/27/2019  1:18 PM 0.3 mL 05/07/2019 Intramuscular   Manufacturer: [place]   Lot: [id number]   [place]: [id number]

## 2019-08-11 ENCOUNTER — Other Ambulatory Visit: Payer: Self-pay | Admitting: Internal Medicine

## 2019-08-11 NOTE — Telephone Encounter (Signed)
Please refill as per office routine med refill policy (all routine meds refilled for 3 mo or monthly per pt preference up to one year from last visit, then month to month grace period for 3 mo, then further med refills will have to be denied)  

## 2019-08-15 ENCOUNTER — Other Ambulatory Visit: Payer: Self-pay | Admitting: Internal Medicine

## 2019-08-15 MED ORDER — ALBUTEROL SULFATE HFA 108 (90 BASE) MCG/ACT IN AERS: 2.0000 | INHALATION_SPRAY | Freq: Four times a day (QID) | RESPIRATORY_TRACT | 3 refills | Status: DC | PRN

## 2019-08-16 ENCOUNTER — Other Ambulatory Visit: Payer: Self-pay | Admitting: Internal Medicine

## 2019-09-29 ENCOUNTER — Encounter: Payer: Self-pay | Admitting: Internal Medicine

## 2019-09-29 ENCOUNTER — Other Ambulatory Visit: Payer: Self-pay

## 2019-09-29 ENCOUNTER — Ambulatory Visit (INDEPENDENT_AMBULATORY_CARE_PROVIDER_SITE_OTHER): Payer: PPO | Admitting: Internal Medicine

## 2019-09-29 ENCOUNTER — Ambulatory Visit (INDEPENDENT_AMBULATORY_CARE_PROVIDER_SITE_OTHER)
Admission: RE | Admit: 2019-09-29 | Discharge: 2019-09-29 | Disposition: A | Discharge status: Home / Self Care | Payer: PPO | Source: Ambulatory Visit | Attending: Internal Medicine | Admitting: Internal Medicine

## 2019-09-29 VITALS — BP 124/76 | HR 67 | Temp 98.1°F | Ht 64.0 in | Wt 190.0 lb

## 2019-09-29 DIAGNOSIS — M546 Pain in thoracic spine: Secondary | ICD-10-CM

## 2019-09-29 DIAGNOSIS — S161XXA Strain of muscle, fascia and tendon at neck level, initial encounter: Secondary | ICD-10-CM | POA: Diagnosis not present

## 2019-09-29 DIAGNOSIS — M545 Low back pain, unspecified: Secondary | ICD-10-CM

## 2019-09-29 DIAGNOSIS — S3992XA Unspecified injury of lower back, initial encounter: Secondary | ICD-10-CM | POA: Diagnosis not present

## 2019-09-29 DIAGNOSIS — M542 Cervicalgia: Secondary | ICD-10-CM | POA: Diagnosis not present
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
## 2019-10-04 ENCOUNTER — Encounter: Payer: Self-pay | Admitting: Internal Medicine

## 2019-10-14 ENCOUNTER — Other Ambulatory Visit: Payer: Self-pay

## 2019-10-14 ENCOUNTER — Other Ambulatory Visit: Payer: Self-pay | Admitting: *Deleted

## 2019-10-14 ENCOUNTER — Encounter: Payer: Self-pay | Admitting: Internal Medicine

## 2019-10-14 ENCOUNTER — Ambulatory Visit (INDEPENDENT_AMBULATORY_CARE_PROVIDER_SITE_OTHER): Payer: PPO | Admitting: Internal Medicine

## 2019-10-14 ENCOUNTER — Ambulatory Visit (INDEPENDENT_AMBULATORY_CARE_PROVIDER_SITE_OTHER): Payer: PPO

## 2019-10-14 VITALS — BP 128/80 | HR 69 | Temp 97.9°F | Resp 16 | Ht 64.0 in | Wt 187.8 lb

## 2019-10-14 DIAGNOSIS — Z87891 Personal history of nicotine dependence: Secondary | ICD-10-CM

## 2019-10-14 DIAGNOSIS — I2510 Atherosclerotic heart disease of native coronary artery without angina pectoris: Secondary | ICD-10-CM | POA: Diagnosis not present

## 2019-10-14 DIAGNOSIS — Z0000 Encounter for general adult medical examination without abnormal findings: Secondary | ICD-10-CM

## 2019-10-14 DIAGNOSIS — E119 Type 2 diabetes mellitus without complications: Secondary | ICD-10-CM

## 2019-10-14 DIAGNOSIS — F5101 Primary insomnia: Secondary | ICD-10-CM

## 2019-10-14 DIAGNOSIS — I2584 Coronary atherosclerosis due to calcified coronary lesion: Secondary | ICD-10-CM | POA: Diagnosis not present

## 2019-10-14 DIAGNOSIS — Z23 Encounter for immunization: Secondary | ICD-10-CM | POA: Diagnosis not present

## 2019-10-14 DIAGNOSIS — I10 Essential (primary) hypertension: Secondary | ICD-10-CM

## 2019-10-14 LAB — BASIC METABOLIC PANEL
BUN: 18 mg/dL (ref 6–23)
CO2: 30 mEq/L (ref 19–32)
Calcium: 9.5 mg/dL (ref 8.4–10.5)
Chloride: 99 mEq/L (ref 96–112)
Creatinine, Ser: 0.88 mg/dL (ref 0.40–1.50)
GFR: 84.33 mL/min (ref >60.00)
Glucose, Bld: 137 mg/dL — ABNORMAL HIGH (ref 70–99)
Potassium: 3.9 mEq/L (ref 3.5–5.1)
Sodium: 133 mEq/L — ABNORMAL LOW (ref 135–145)

## 2019-10-14 LAB — HEMOGLOBIN A1C: Hgb A1c MFr Bld: 6.5 % (ref 4.6–6.5)

## 2019-10-14 NOTE — Assessment & Plan Note (Signed)
Recovering ok

## 2019-10-14 NOTE — Assessment & Plan Note (Signed)
Pravachol No CP

## 2019-10-14 NOTE — Assessment & Plan Note (Signed)
CT in 7/21

## 2019-10-14 NOTE — Assessment & Plan Note (Signed)
Metfomin XR, Actos

## 2019-10-14 NOTE — Assessment & Plan Note (Signed)
Hydroxyzine  when awake at night prn to fall back to sleep

## 2019-10-14 NOTE — Progress Notes (Signed)
Subjective:   [name]. is a 74 y.o. male who presents for Medicare Annual/Subsequent preventive examination.  Review of Systems:  No ROS. Medicare Wellness Visit. Additional risk factors are reflected in social history. Cardiac Risk Factors include: dyslipidemia;advanced age (>78men, >66 women);diabetes mellitus;hypertension;male gender;obesity (BMI >30kg/m2);family history of premature cardiovascular disease  Sleep Patterns: Issues with falling asleep, is awake during the night. Home Safety/Smoke Alarms: Feels safe in home; uses home alarm. Smoke alarms in place. Living environment: 2-story home; Lives with spouse; no needs for DME; good support system. Seat Belt Safety/Bike Helmet: Wears seat belt.    Objective:    Vitals: BP 128/80 (BP Location: Left Arm, Patient Position: Sitting, Cuff Size: Normal)   Pulse 69   Temp 97.9 F (36.6 C)   Resp 16   Ht 5\' 4"  (1.626 m)   Wt 187 lb 12.8 oz (85.2 kg)   SpO2 94%   BMI 32.24 kg/m   Body mass index is 32.24 kg/m.  Advanced Directives 10/14/2019 08/21/2016 07/18/2011  Does Patient Have a Medical Advance Directive? Yes No Patient does not have advance directive  Type of Scientist, forensic Power of [place];Living will - -  Does patient want to make changes to medical advance directive? No - Patient declined - -  Copy of [place] in Chart? No - copy requested - -  Would patient like information on creating a medical advance directive? - Yes (ED - Information included in AVS) -    Tobacco Social History   Tobacco Use  Smoking Status Former Smoker  . Packs/day: 1.50  . Years: 40.00  . Pack years: 60.00  . Types: Cigarettes  . Quit date: 08/13/2001  . Years since quitting: 18.1  Smokeless Tobacco Never Used  Tobacco Comment   Counseled to remain smoke free.     Counseling given: No Comment: Counseled to remain smoke free.   Clinical Intake:  Pre-visit preparation completed:  Yes  Pain : No/denies pain Pain Score: 0-No pain     BMI - recorded: 32.2 Nutritional Status: BMI > 30  Obese Nutritional Risks: None Diabetes: Yes CBG done?: No Did pt. bring in CBG monitor from home?: No  How often do you need to have someone help you when you read instructions, pamphlets, or other written materials from your doctor or pharmacy?: 1 - Never What is the last grade level you completed in school?: Master's Degree  Interpreter Needed?: No  Information entered by :: Cristan Scherzer N. [place] Guitar, LPN  Past Medical History:  Diagnosis Date  . Asthma    mild, chronic cough  . Chronic kidney disease    kidney stone and infection- started 03-27-11  . Diabetes mellitus   . GERD (gastroesophageal reflux disease)   . Hyperlipidemia   . Hypertension    Past Surgical History:  Procedure Laterality Date  . APPENDECTOMY  1963  . CYSTOSCOPY W/ URETERAL STENT PLACEMENT  03/26/11   removal of kidney stone   Family History  Problem Relation Age of Onset  . Heart disease Father 49       MI  . Diabetes Father   . Hypertension Father   . Multiple sclerosis Mother   . Diabetes Brother   . Hypertension Other   . Coronary artery disease Other   . Heart disease Sister 49       CAD  . Diabetes Sister   . Parkinsonism Brother   . Hypertension Brother   . Colon cancer  Neg Hx   . Esophageal cancer Neg Hx   . Stomach cancer Neg Hx    Social History   Socioeconomic History  . Marital status: Married    Spouse name: Not on file  . Number of children: 3  . Years of education: Not on file  . Highest education level: Not on file  Occupational History  . Occupation: Professional  Tobacco Use  . Smoking status: Former Smoker    Packs/day: 1.50    Years: 40.00    Pack years: 60.00    Types: Cigarettes    Quit date: 08/13/2001    Years since quitting: 18.1  . Smokeless tobacco: Never Used  . Tobacco comment: Counseled to remain smoke free.  Substance and Sexual Activity  .  Alcohol use: Yes    Alcohol/week: 10.0 standard drinks    Types: 10 Glasses of wine per week  . Drug use: No  . Sexual activity: Yes  Other Topics Concern  . Not on file  Social History Narrative   Regular Exercise -  YES; riding      Social Determinants of Health   Financial Resource Strain:   . Difficulty of Paying Living Expenses:   Food Insecurity:   . Worried About Charity fundraiser in the Last Year:   . Arboriculturist in the Last Year:   Transportation Needs:   . Film/video editor (Medical):   [name] Lack of Transportation (Non-Medical):   Physical Activity:   . Days of Exercise per Week:   . Minutes of Exercise per Session:   Stress:   . Feeling of Stress :   Social Connections:   . Frequency of Communication with Friends and Family:   . Frequency of Social Gatherings with Friends and Family:   . Attends Religious Services:   . Active Member of Clubs or Organizations:   . Attends Archivist Meetings:   [name] Marital Status:     Outpatient Encounter Medications as of 10/14/2019  Medication Sig  . acetaminophen (TYLENOL) 650 MG CR tablet Take 650 mg by mouth 2 (two) times daily as needed for pain.   [name] albuterol (PROAIR HFA) 108 (90 Base) MCG/ACT inhaler Inhale 2 puffs into the lungs every 6 (six) hours as needed for wheezing (cough).  . ALPRAZolam (XANAX) 0.5 MG tablet TAKE 1 TO 2 TABLETS BY MOUTH AT BEDTIME AS NEEDED FOR ANXIETY OR SLEEP  . amLODipine (NORVASC) 10 MG tablet TAKE 1 TABLET (10 MG TOTAL) BY MOUTH DAILY BEFORE BREAKFAST. (Patient taking differently: Take 5 mg by mouth daily before breakfast. )  . aspirin EC 81 MG tablet Take 81 mg by mouth daily.  . carvedilol (COREG) 25 MG tablet TAKE 1 TABLET BY MOUTH TWICE A DAY WITH MEALS  . Cholecalciferol (VITAMIN D3) 1000 UNITS tablet Take 2,000 Units by mouth daily.   . Coenzyme Q10 (CO Q-10) 200 MG CAPS Take 400 mg by mouth daily.   . Folic Acid 0.8 MG CAPS Take 2 capsules by mouth every morning.   [name]  glucose blood test strip USE TO TEST BLOOD SUGAR TWICE A DAY  . hydrOXYzine (ATARAX/VISTARIL) 25 MG tablet TAKE 1-2 TABLETS AT BEDTIME AS NEED FOR INSOMNIA  . KRILL OIL PO Take by mouth.  . metFORMIN (GLUCOPHAGE-XR) 750 MG 24 hr tablet TAKE 1 TABLET BY MOUTH EVERY DAY WITH BREAKFAST  . Multiple Vitamins-Minerals (PRESERVISION AREDS 2+MULTI VIT PO)   . OneTouch Delica Lancets 99991111 MISC USE TO CHECK  BLOOD SUGAR TWICE DAILY  . ONETOUCH VERIO test strip USE TO CHECK BLOOD SUGAR DAILY  . pioglitazone (ACTOS) 45 MG tablet TAKE 1 TABLET (45 MG TOTAL) BY MOUTH EVERY OTHER DAY.  [name] Pitavastatin Calcium 2 MG TABS Take 1 tablet (2 mg total) by mouth daily.  . traMADol (ULTRAM) 50 MG tablet Take by mouth every 6 (six) hours as needed.  . traZODone (DESYREL) 50 MG tablet Take 1-2 tablets (50-100 mg total) by mouth at bedtime as needed for sleep.  . [DISCONTINUED] mometasone-formoterol (DULERA) 100-5 MCG/ACT AERO Inhale 2 puffs into the lungs every morning.   No facility-administered encounter medications on file as of 10/14/2019.    Activities of Daily Living In your present state of health, do you have any difficulty performing the following activities: 10/14/2019  Hearing? N  Comment wears hearing aids  Vision? N  Difficulty concentrating or making decisions? N  Walking or climbing stairs? N  Dressing or bathing? N  Doing errands, shopping? N  Preparing Food and eating ? N  Using the Toilet? N  In the past six months, have you accidently leaked urine? N  Do you have problems with loss of bowel control? N  Managing your Medications? N  Managing your Finances? N  Housekeeping or managing your Housekeeping? N  Some recent data might be hidden    Patient Care Team: Plotnikov, Evie Lacks, MD as PCP - General Croitoru, Dani Gobble, MD as Consulting Physician (Cardiology)   Assessment:   This is a routine wellness examination for [place].  Exercise Activities and Dietary recommendations Current Exercise  Habits: The patient does not participate in regular exercise at present, Exercise limited by: respiratory conditions(s);orthopedic condition(s);cardiac condition(s)  Goals    . lose 20 pounds      Start to exercise, be active, eat a healthy diet       Fall Risk Fall Risk  10/14/2019 06/16/2019 10/15/2017 08/21/2016 04/10/2016  Falls in the past year? 0 0 No No No  Number falls in past yr: 0 - - - -  Injury with Fall? 0 - - - -  Risk for fall due to : No Fall Risks - - - -  Follow up Falls evaluation completed;Falls prevention discussed;Education provided Falls evaluation completed - - -   Is the patient's home free of loose throw rugs in walkways, pet beds, electrical cords, etc?   yes      Grab bars in the bathroom? no      Handrails on the stairs?   yes      Adequate lighting?   yes   Depression Screen PHQ 2/9 Scores 10/14/2019 06/16/2019 10/15/2017 08/21/2016  PHQ - 2 Score 0 0 0 0          Immunization History  Administered Date(s) Administered  . Influenza Split 02/21/2012  . Influenza Whole 02/25/2011  . Influenza, High Dose Seasonal PF 03/10/2013, 04/10/2016, 02/19/2017, 03/16/2018  . Influenza,inj,Quad PF,6+ Mos 03/28/2014, 04/04/2015  . PFIZER SARS-COV-2 Vaccination 07/02/2019, 07/27/2019  . Pneumococcal Conjugate-13 07/23/2013  . Pneumococcal Polysaccharide-23 11/13/2009  . Td 11/13/2009  . Zoster 11/20/2009    Qualifies for Shingles Vaccine? completed  Screening Tests Health Maintenance  Topic Date Due  . OPHTHALMOLOGY EXAM  07/29/2018  . URINE MICROALBUMIN  05/19/2019  . TETANUS/TDAP  11/14/2019  . HEMOGLOBIN A1C  12/14/2019  . INFLUENZA VACCINE  12/26/2019  . FOOT EXAM  06/15/2020  . COLONOSCOPY  04/10/2021  . COVID-19 Vaccine  Completed  . Hepatitis C Screening  Completed  . PNA vac Low Risk Adult  Completed   Cancer Screenings: Lung: Low Dose CT Chest recommended if Age 20-80 years, 30 pack-year currently smoking OR have quit w/in 15years. Patient  does not qualify. Colorectal: Yes     Plan:     Reviewed health maintenance screenings with patient today and relevant education, vaccines, and/or referrals were provided.    Continue doing brain stimulating activities (puzzles, reading, adult coloring books, staying active) to keep memory sharp.    Continue to eat heart healthy diet (full of fruits, vegetables, whole grains, lean protein, water--limit salt, fat, and sugar intake) and increase physical activity as tolerated.  I have personally reviewed and noted the following in the patient's chart:   . Medical and social history . Use of alcohol, tobacco or illicit drugs  . Current medications and supplements . Functional ability and status . Nutritional status . Physical activity . Advanced directives . List of other physicians . Hospitalizations, surgeries, and ER visits in previous 12 months . Vitals . Screenings to include cognitive, depression, and falls . Referrals and appointments  In addition, I have reviewed and discussed with patient certain preventive protocols, quality metrics, and best practice recommendations. A written personalized care plan for preventive services as well as general preventive health recommendations were provided to patient.     Sheral Flow, LPN  [facility] Nurse Health Advisor

## 2019-10-14 NOTE — Addendum Note (Signed)
Addended by: Marcina Millard on: 10/14/2019 09:47 AM   Modules accepted: Orders
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
## 2019-10-14 NOTE — Addendum Note (Signed)
Addended by: Cresenciano Lick on: 10/14/2019 10:14 AM   Modules accepted: Orders

## 2019-10-14 NOTE — Assessment & Plan Note (Signed)
Coreg, Amlodipine 

## 2019-11-28 ENCOUNTER — Other Ambulatory Visit: Payer: Self-pay | Admitting: Internal Medicine

## 2019-12-13 ENCOUNTER — Ambulatory Visit (INDEPENDENT_AMBULATORY_CARE_PROVIDER_SITE_OTHER)
Admission: RE | Admit: 2019-12-13 | Discharge: 2019-12-13 | Disposition: A | Discharge status: Home / Self Care | Payer: PPO | Source: Ambulatory Visit | Attending: Internal Medicine | Admitting: Internal Medicine

## 2019-12-13 ENCOUNTER — Other Ambulatory Visit: Payer: Self-pay

## 2019-12-13 DIAGNOSIS — I700 Atherosclerosis of aorta: Secondary | ICD-10-CM | POA: Diagnosis not present

## 2019-12-13 DIAGNOSIS — J9809 Other diseases of bronchus, not elsewhere classified: Secondary | ICD-10-CM | POA: Diagnosis not present

## 2019-12-13 DIAGNOSIS — J432 Centrilobular emphysema: Secondary | ICD-10-CM | POA: Diagnosis not present

## 2019-12-13 DIAGNOSIS — J449 Chronic obstructive pulmonary disease, unspecified: Secondary | ICD-10-CM

## 2019-12-13 DIAGNOSIS — I2510 Atherosclerotic heart disease of native coronary artery without angina pectoris: Secondary | ICD-10-CM | POA: Diagnosis not present

## 2020-01-24 DIAGNOSIS — E119 Type 2 diabetes mellitus without complications: Secondary | ICD-10-CM | POA: Diagnosis not present

## 2020-01-24 LAB — HM DIABETES EYE EXAM

## 2020-02-06 ENCOUNTER — Other Ambulatory Visit: Payer: Self-pay | Admitting: Internal Medicine

## 2020-02-06 MED ORDER — ONETOUCH VERIO W/DEVICE KIT
1.0000 [IU] | PACK | Freq: Every day | 1 refills | Status: DC | PRN
Start: 2020-02-06 — End: 2022-04-15

## 2020-02-15 ENCOUNTER — Ambulatory Visit: Payer: PPO | Admitting: Internal Medicine

## 2020-02-18 ENCOUNTER — Other Ambulatory Visit: Payer: Self-pay

## 2020-02-18 MED ORDER — ONETOUCH DELICA LANCETS 33G MISC: 3 refills | Status: DC

## 2020-02-18 MED ORDER — ONETOUCH VERIO VI STRP: ORAL_STRIP | 5 refills | Status: DC

## 2020-02-22 ENCOUNTER — Encounter: Payer: Self-pay | Admitting: Internal Medicine

## 2020-02-22 ENCOUNTER — Other Ambulatory Visit: Payer: Self-pay

## 2020-02-22 ENCOUNTER — Ambulatory Visit (INDEPENDENT_AMBULATORY_CARE_PROVIDER_SITE_OTHER): Payer: PPO | Admitting: Internal Medicine

## 2020-02-22 VITALS — BP 156/90 | HR 65 | Temp 98.5°F | Ht 64.0 in | Wt 186.0 lb

## 2020-02-22 DIAGNOSIS — G8929 Other chronic pain: Secondary | ICD-10-CM | POA: Diagnosis not present

## 2020-02-22 DIAGNOSIS — E119 Type 2 diabetes mellitus without complications: Secondary | ICD-10-CM

## 2020-02-22 DIAGNOSIS — M5440 Lumbago with sciatica, unspecified side: Secondary | ICD-10-CM

## 2020-02-22 DIAGNOSIS — Z23 Encounter for immunization: Secondary | ICD-10-CM

## 2020-02-22 DIAGNOSIS — I10 Essential (primary) hypertension: Secondary | ICD-10-CM

## 2020-02-22 LAB — BASIC METABOLIC PANEL
BUN: 15 mg/dL (ref 6–23)
CO2: 31 mEq/L (ref 19–32)
Calcium: 9.5 mg/dL (ref 8.4–10.5)
Chloride: 101 mEq/L (ref 96–112)
Creatinine, Ser: 0.89 mg/dL (ref 0.40–1.50)
GFR: 83.16 mL/min (ref >60.00)
Glucose, Bld: 139 mg/dL — ABNORMAL HIGH (ref 70–99)
Potassium: 4.8 mEq/L (ref 3.5–5.1)
Sodium: 136 mEq/L (ref 135–145)

## 2020-02-22 LAB — HEMOGLOBIN A1C: Hgb A1c MFr Bld: 6.9 % — ABNORMAL HIGH (ref 4.6–6.5)

## 2020-02-22 NOTE — Assessment & Plan Note (Signed)
Labs

## 2020-02-22 NOTE — Assessment & Plan Note (Signed)
You can try [place] Mushroom extract or capsules for memory, pain   

## 2020-02-22 NOTE — Assessment & Plan Note (Signed)
Coreg, Amlodipine

## 2020-02-22 NOTE — Addendum Note (Signed)
Addended by: Jacob Moores on: 02/22/2020 11:22 AM   Modules accepted: Orders

## 2020-02-22 NOTE — Progress Notes (Signed)
Subjective:  Patient ID: [name]., male    DOB: [birth year]  Age: 74 y.o. MRN: [id number]  CC: No chief complaint on file.   HPI [name]. presents for HTN, DM, CAD f/u  Outpatient Medications Prior to Visit  Medication Sig Dispense Refill  . acetaminophen (TYLENOL) 650 MG CR tablet Take 650 mg by mouth 2 (two) times daily as needed for pain.     [name] albuterol (PROAIR HFA) 108 (90 Base) MCG/ACT inhaler Inhale 2 puffs into the lungs every 6 (six) hours as needed for wheezing (cough). 18 g 3  . ALPRAZolam (XANAX) 0.5 MG tablet TAKE 1 TO 2 TABLETS BY MOUTH AT BEDTIME AS NEEDED FOR ANXIETY OR SLEEP 180 tablet 1  . amLODipine (NORVASC) 10 MG tablet TAKE 1 TABLET (10 MG TOTAL) BY MOUTH DAILY BEFORE BREAKFAST. (Patient taking differently: Take 5 mg by mouth daily before breakfast. ) 90 tablet 3  . aspirin EC 81 MG tablet Take 81 mg by mouth daily.    . Blood Glucose Monitoring Suppl (ONETOUCH VERIO) w/Device KIT 1 Units by Does not apply route daily as needed. 1 kit 1  . carvedilol (COREG) 25 MG tablet TAKE 1 TABLET BY MOUTH TWICE A DAY WITH MEALS 180 tablet 3  . Cholecalciferol (VITAMIN D3) 1000 UNITS tablet Take 2,000 Units by mouth daily.     . Coenzyme Q10 (CO Q-10) 200 MG CAPS Take 400 mg by mouth daily.     . Folic Acid 0.8 MG CAPS Take 2 capsules by mouth every morning.  30 each 0  . glucose blood (ONETOUCH VERIO) test strip USE TO CHECK BLOOD SUGAR DAILY 100 strip 5  . glucose blood test strip USE TO TEST BLOOD SUGAR TWICE A DAY 100 each 3  . hydrOXYzine (ATARAX/VISTARIL) 25 MG tablet TAKE 1-2 TABLETS AT BEDTIME AS NEED FOR INSOMNIA 180 tablet 1  . KRILL OIL PO Take by mouth.    . metFORMIN (GLUCOPHAGE-XR) 750 MG 24 hr tablet TAKE 1 TABLET BY MOUTH EVERY DAY WITH BREAKFAST 90 tablet 2  . Multiple Vitamins-Minerals (PRESERVISION AREDS 2+MULTI VIT PO)     . OneTouch Delica Lancets 54S MISC USE TO CHECK BLOOD SUGAR TWICE DAILY 100 each 3  . pioglitazone (ACTOS) 45 MG  tablet TAKE 1 TABLET (45 MG TOTAL) BY MOUTH EVERY OTHER DAY. 45 tablet 3  . Pitavastatin Calcium 2 MG TABS Take 1 tablet (2 mg total) by mouth daily. 30 tablet 11  . traMADol (ULTRAM) 50 MG tablet Take by mouth every 6 (six) hours as needed.    . traZODone (DESYREL) 50 MG tablet Take 1-2 tablets (50-100 mg total) by mouth at bedtime as needed for sleep. 180 tablet 1   No facility-administered medications prior to visit.    ROS: Review of Systems  Constitutional: Negative for appetite change, fatigue and unexpected weight change.  HENT: Negative for congestion, nosebleeds, sneezing, sore throat and trouble swallowing.   Eyes: Negative for itching and visual disturbance.  Respiratory: Negative for cough.   Cardiovascular: Negative for chest pain, palpitations and leg swelling.  Gastrointestinal: Negative for abdominal distention, blood in stool, diarrhea and nausea.  Genitourinary: Negative for frequency and hematuria.  Musculoskeletal: Positive for back pain. Negative for gait problem, joint swelling and neck pain.  Skin: Negative for rash.  Neurological: Negative for dizziness, tremors, speech difficulty and weakness.  Psychiatric/Behavioral: Positive for sleep disturbance. Negative for agitation and dysphoric mood. The patient is not nervous/anxious.  Objective:  BP (!) 156/90 (BP Location: Left Arm, Patient Position: Sitting, Cuff Size: Normal)   Pulse 65   Temp 98.5 F (36.9 C) (Oral)   Ht '5\' 4"'  (1.626 m)   Wt 186 lb (84.4 kg)   SpO2 96%   BMI 31.93 kg/m   BP Readings from Last 3 Encounters:  02/22/20 (!) 156/90  10/14/19 128/80  09/29/19 124/76    Wt Readings from Last 3 Encounters:  02/22/20 186 lb (84.4 kg)  10/14/19 187 lb 12.8 oz (85.2 kg)  09/29/19 190 lb (86.2 kg)    Physical Exam Constitutional:      General: He is not in acute distress.    Appearance: He is well-developed.     Comments: NAD  Eyes:     Conjunctiva/sclera: Conjunctivae normal.      Pupils: Pupils are equal, round, and reactive to light.  Neck:     Thyroid: No thyromegaly.     Vascular: No JVD.  Cardiovascular:     Rate and Rhythm: Normal rate and regular rhythm.     Heart sounds: Normal heart sounds. No murmur heard.  No friction rub. No gallop.   Pulmonary:     Effort: Pulmonary effort is normal. No respiratory distress.     Breath sounds: Normal breath sounds. No wheezing or rales.  Chest:     Chest wall: No tenderness.  Abdominal:     General: Bowel sounds are normal. There is no distension.     Palpations: Abdomen is soft. There is no mass.     Tenderness: There is no abdominal tenderness. There is no guarding or rebound.  Musculoskeletal:        General: Tenderness present. Normal range of motion.     Cervical back: Normal range of motion.  Lymphadenopathy:     Cervical: No cervical adenopathy.  Skin:    General: Skin is warm and dry.     Findings: No rash.  Neurological:     Mental Status: He is alert and oriented to person, place, and time.     Cranial Nerves: No cranial nerve deficit.     Motor: No abnormal muscle tone.     Coordination: Coordination normal.     Gait: Gait normal.     Deep Tendon Reflexes: Reflexes are normal and symmetric.  Psychiatric:        Behavior: Behavior normal.        Thought Content: Thought content normal.        Judgment: Judgment normal.     Lab Results  Component Value Date   WBC 5.5 06/16/2019   HGB 15.6 06/16/2019   HCT 47.1 06/16/2019   PLT 207.0 06/16/2019   GLUCOSE 137 (H) 10/14/2019   CHOL 211 (H) 06/16/2019   TRIG 83.0 06/16/2019   HDL 88.10 06/16/2019   LDLCALC 106 (H) 06/16/2019   ALT 17 06/16/2019   AST 18 06/16/2019   NA 133 (L) 10/14/2019   K 3.9 10/14/2019   CL 99 10/14/2019   CREATININE 0.88 10/14/2019   BUN 18 10/14/2019   CO2 30 10/14/2019   TSH 1.66 06/16/2019   PSA 0.95 06/16/2019   HGBA1C 6.5 10/14/2019   MICROALBUR 1.4 05/18/2018    CT Chest Wo Contrast  Result Date:  12/14/2019 CLINICAL DATA:  74 year old male with history of COPD. EXAM: CT CHEST WITHOUT CONTRAST TECHNIQUE: Multidetector CT imaging of the chest was performed following the standard protocol without IV contrast. COMPARISON:  Chest CT 12/02/2018. FINDINGS: Cardiovascular: Heart  size is normal. There is no significant pericardial fluid, thickening or pericardial calcification. There is aortic atherosclerosis, as well as atherosclerosis of the great vessels of the mediastinum and the coronary arteries, including calcified atherosclerotic plaque in the left main, left anterior descending, left circumflex and right coronary arteries. Severe calcifications of the aortic valve. Mediastinum/Nodes: No pathologically enlarged mediastinal or hilar lymph nodes. Please note that accurate exclusion of hilar adenopathy is limited on noncontrast CT scans. Esophagus is unremarkable in appearance. No axillary lymphadenopathy. Lungs/Pleura: Tiny 3 mm left lower lobe pulmonary nodule (axial image 97 of series 3). 3 mm right upper lobe pulmonary nodule (axial image 46 of series 3) also unchanged. No other larger more suspicious appearing pulmonary nodules or masses are noted. No acute consolidative airspace disease. No pleural effusions. Mild diffuse bronchial wall thickening with mild centrilobular and paraseptal emphysema. Upper Abdomen: Aortic atherosclerosis. Musculoskeletal: There are no aggressive appearing lytic or blastic lesions noted in the visualized portions of the skeleton. IMPRESSION: 1. Tiny pulmonary nodules measuring 3 mm in both lungs, stable compared to the prior examination, considered definitively benign. No future imaging follow-up is recommended. 2. Diffuse bronchial wall thickening with mild centrilobular and paraseptal emphysema; imaging findings suggestive of underlying COPD. 3. Aortic atherosclerosis, in addition to left main and 3 vessel coronary artery disease. Assessment for potential risk factor  modification, dietary therapy or pharmacologic therapy may be warranted, if clinically indicated. 4. There are calcifications of the aortic valve. Echocardiographic correlation for evaluation of potential valvular dysfunction may be warranted if clinically indicated. Aortic Atherosclerosis (ICD10-I70.0) and Emphysema (ICD10-J43.9). Electronically Signed   By: Vinnie Langton M.D.   On: 12/14/2019 10:11    Assessment & Plan:    [name], MD

## 2020-02-22 NOTE — Patient Instructions (Signed)
You can try [place]'s [place]ane Mushroom extract or capsules for memory, pain   

## 2020-04-19 ENCOUNTER — Encounter: Payer: Self-pay | Admitting: Internal Medicine

## 2020-04-19 ENCOUNTER — Other Ambulatory Visit: Payer: Self-pay

## 2020-04-19 ENCOUNTER — Ambulatory Visit (INDEPENDENT_AMBULATORY_CARE_PROVIDER_SITE_OTHER): Payer: PPO | Admitting: Internal Medicine

## 2020-04-19 DIAGNOSIS — L570 Actinic keratosis: Secondary | ICD-10-CM | POA: Diagnosis not present

## 2020-04-23 ENCOUNTER — Encounter: Payer: Self-pay | Admitting: Internal Medicine

## 2020-04-23 NOTE — Assessment & Plan Note (Addendum)
Options discussed.  The patient would like to have a cryo procedure done today.  Will do.  If the lesion recurs will need to refer him to see ENT for a consultation

## 2020-04-23 NOTE — Progress Notes (Signed)
Subjective:  Patient ID: Chase Mage., male    DOB: 06/17/1945  Age: 74 y.o. MRN: [id number]  CC: Ear Problem (Sore on (L) ear)   HPI Chase Mage. presents for rough spot on the right ear, irritating  Outpatient Medications Prior to Visit  Medication Sig Dispense Refill  . acetaminophen (TYLENOL) 650 MG CR tablet Take 650 mg by mouth 2 (two) times daily as needed for pain.     [name] albuterol (PROAIR HFA) 108 (90 Base) MCG/ACT inhaler Inhale 2 puffs into the lungs every 6 (six) hours as needed for wheezing (cough). 18 g 3  . ALPRAZolam (XANAX) 0.5 MG tablet TAKE 1 TO 2 TABLETS BY MOUTH AT BEDTIME AS NEEDED FOR ANXIETY OR SLEEP 180 tablet 1  . amLODipine (NORVASC) 10 MG tablet TAKE 1 TABLET (10 MG TOTAL) BY MOUTH DAILY BEFORE BREAKFAST. (Patient taking differently: Take 5 mg by mouth daily before breakfast. ) 90 tablet 3  . aspirin EC 81 MG tablet Take 81 mg by mouth daily.    . Blood Glucose Monitoring Suppl (ONETOUCH VERIO) w/Device KIT 1 Units by Does not apply route daily as needed. 1 kit 1  . carvedilol (COREG) 25 MG tablet TAKE 1 TABLET BY MOUTH TWICE A DAY WITH MEALS 180 tablet 3  . Cholecalciferol (VITAMIN D3) 1000 UNITS tablet Take 2,000 Units by mouth daily.     . Coenzyme Q10 (CO Q-10) 200 MG CAPS Take 400 mg by mouth daily.     . Folic Acid 0.8 MG CAPS Take 2 capsules by mouth every morning.  30 each 0  . glucose blood (ONETOUCH VERIO) test strip USE TO CHECK BLOOD SUGAR DAILY 100 strip 5  . glucose blood test strip USE TO TEST BLOOD SUGAR TWICE A DAY 100 each 3  . hydrOXYzine (ATARAX/VISTARIL) 25 MG tablet TAKE 1-2 TABLETS AT BEDTIME AS NEED FOR INSOMNIA 180 tablet 1  . KRILL OIL PO Take by mouth.    . metFORMIN (GLUCOPHAGE-XR) 750 MG 24 hr tablet TAKE 1 TABLET BY MOUTH EVERY DAY WITH BREAKFAST 90 tablet 2  . Multiple Vitamins-Minerals (PRESERVISION AREDS 2+MULTI VIT PO)     . OneTouch Delica Lancets 66Z MISC USE TO CHECK BLOOD SUGAR TWICE DAILY 100 each 3  .  pioglitazone (ACTOS) 45 MG tablet TAKE 1 TABLET (45 MG TOTAL) BY MOUTH EVERY OTHER DAY. 45 tablet 3  . Pitavastatin Calcium 2 MG TABS Take 1 tablet (2 mg total) by mouth daily. 30 tablet 11  . traMADol (ULTRAM) 50 MG tablet Take by mouth every 6 (six) hours as needed.    . traZODone (DESYREL) 50 MG tablet Take 1-2 tablets (50-100 mg total) by mouth at bedtime as needed for sleep. 180 tablet 1   No facility-administered medications prior to visit.    ROS: Review of Systems  Skin: Positive for color change. Negative for rash and wound.    Objective:  BP 140/78 (BP Location: Left Arm)   Pulse 72   Temp 98.2 F (36.8 C) (Oral)   Wt 193 lb 12.8 oz (87.9 kg)   SpO2 96%   BMI 33.27 kg/m   BP Readings from Last 3 Encounters:  04/19/20 140/78  02/22/20 (!) 156/90  10/14/19 128/80    Wt Readings from Last 3 Encounters:  04/19/20 193 lb 12.8 oz (87.9 kg)  02/22/20 186 lb (84.4 kg)  10/14/19 187 lb 12.8 oz (85.2 kg)    Physical Exam Constitutional:  General: He is not in acute distress.    Appearance: He is obese.  Skin:    Findings: Lesion present. No erythema or rash.   AK type lesion on the left ear   Procedure Note :     Procedure : Cryosurgery   Indication:    Actinic keratosis(es)   Risks including unsuccessful procedure , bleeding, infection, bruising, scar, a need for a repeat  procedure and others were explained to the patient in detail as well as the benefits. Informed consent was obtained verbally.    1 lesion(s)  on the left ear was/were treated with liquid nitrogen on a Q-tip in a usual fasion . Band-Aid was applied and antibiotic ointment was given for a later use.   Tolerated well. Complications none.   Postprocedure instructions :     Keep the wounds clean. You can wash them with liquid soap and water. Pat dry with gauze or a Kleenex tissue  Before applying antibiotic ointment and a Band-Aid.   You need to report immediately  if  any signs of  infection develop.     Lab Results  Component Value Date   WBC 5.5 06/16/2019   HGB 15.6 06/16/2019   HCT 47.1 06/16/2019   PLT 207.0 06/16/2019   GLUCOSE 139 (H) 02/22/2020   CHOL 211 (H) 06/16/2019   TRIG 83.0 06/16/2019   HDL 88.10 06/16/2019   LDLCALC 106 (H) 06/16/2019   ALT 17 06/16/2019   AST 18 06/16/2019   NA 136 02/22/2020   K 4.8 02/22/2020   CL 101 02/22/2020   CREATININE 0.89 02/22/2020   BUN 15 02/22/2020   CO2 31 02/22/2020   TSH 1.66 06/16/2019   PSA 0.95 06/16/2019   HGBA1C 6.9 (H) 02/22/2020   MICROALBUR 1.4 05/18/2018    CT Chest Wo Contrast  Result Date: 12/14/2019 CLINICAL DATA:  74 year old male with history of COPD. EXAM: CT CHEST WITHOUT CONTRAST TECHNIQUE: Multidetector CT imaging of the chest was performed following the standard protocol without IV contrast. COMPARISON:  Chest CT 12/02/2018. FINDINGS: Cardiovascular: Heart size is normal. There is no significant pericardial fluid, thickening or pericardial calcification. There is aortic atherosclerosis, as well as atherosclerosis of the great vessels of the mediastinum and the coronary arteries, including calcified atherosclerotic plaque in the left main, left anterior descending, left circumflex and right coronary arteries. Severe calcifications of the aortic valve. Mediastinum/Nodes: No pathologically enlarged mediastinal or hilar lymph nodes. Please note that accurate exclusion of hilar adenopathy is limited on noncontrast CT scans. Esophagus is unremarkable in appearance. No axillary lymphadenopathy. Lungs/Pleura: Tiny 3 mm left lower lobe pulmonary nodule (axial image 97 of series 3). 3 mm right upper lobe pulmonary nodule (axial image 46 of series 3) also unchanged. No other larger more suspicious appearing pulmonary nodules or masses are noted. No acute consolidative airspace disease. No pleural effusions. Mild diffuse bronchial wall thickening with mild centrilobular and paraseptal emphysema. Upper  Abdomen: Aortic atherosclerosis. Musculoskeletal: There are no aggressive appearing lytic or blastic lesions noted in the visualized portions of the skeleton. IMPRESSION: 1. Tiny pulmonary nodules measuring 3 mm in both lungs, stable compared to the prior examination, considered definitively benign. No future imaging follow-up is recommended. 2. Diffuse bronchial wall thickening with mild centrilobular and paraseptal emphysema; imaging findings suggestive of underlying COPD. 3. Aortic atherosclerosis, in addition to left main and 3 vessel coronary artery disease. Assessment for potential risk factor modification, dietary therapy or pharmacologic therapy may be warranted, if clinically indicated. 4.  There are calcifications of the aortic valve. Echocardiographic correlation for evaluation of potential valvular dysfunction may be warranted if clinically indicated. Aortic Atherosclerosis (ICD10-I70.0) and Emphysema (ICD10-J43.9). Electronically Signed   By: Vinnie Langton M.D.   On: 12/14/2019 10:11    Assessment & Plan:    [name], MD

## 2020-05-11 ENCOUNTER — Other Ambulatory Visit: Payer: Self-pay | Admitting: Internal Medicine

## 2020-05-11 DIAGNOSIS — H9390 Unspecified disorder of ear, unspecified ear: Secondary | ICD-10-CM

## 2020-06-06 DIAGNOSIS — H9390 Unspecified disorder of ear, unspecified ear: Secondary | ICD-10-CM | POA: Diagnosis not present

## 2020-06-09 NOTE — H&P (Signed)
HPI:   [name] is a 75 y.o. male who presents as a consult Patient.   Referring Provider: Janan Halter*  Chief complaint: Growth on ear.  HPI: He has had a painful growth on the top of his left ear for about 6 months. He had it frozen off and it returned again. No history of skin cancer. Otherwise very healthy, not on blood thinners.  PMH/Meds/All/SocHx/FamHx/ROS:   Past Medical History:  Diagnosis Date  . Diabetes mellitus ([place])   Past Surgical History:  Procedure Laterality Date  . APPENDECTOMY  . KIDNEY STONE SURGERY   No family history of bleeding disorders, wound healing problems or difficulty with anesthesia.   Social History   Socioeconomic History  . Marital status: Married  Spouse name: Not on file  . Number of children: Not on file  . Years of education: Not on file  . Highest education level: Not on file  Occupational History  . Not on file  Tobacco Use  . Smoking status: Former Research scientist (life sciences)  . Smokeless tobacco: Never Used  Vaping Use  . Vaping Use: Never used  Substance and Sexual Activity  . Alcohol use: Not on file  . Drug use: Not on file  . Sexual activity: Not on file  Other Topics Concern  . Not on file  Social History Narrative  . Not on file   Social Determinants of Health   Financial Resource Strain: Not on file  Food Insecurity: Not on file  Transportation Needs: Not on file  Physical Activity: Not on file  Stress: Not on file  Social Connections: Not on file  Housing Stability: Not on file   Current Outpatient Medications:  . albuterol 90 mcg/actuation inhaler, Inhale into the lungs., Disp: , Rfl:  . amLODIPine (NORVASC) 10 MG tablet, , Disp: , Rfl:  . aspirin 81 MG EC tablet *ANTIPLATELET*, Take 81 mg by mouth daily., Disp: , Rfl:  . carvediloL (COREG) 25 MG tablet, , Disp: , Rfl:  . cholecalciferol (VITAMIN D3) 1000 UNIT Tab, Take 2,000 Units by mouth daily., Disp: , Rfl:  . coenzyme Q10 200 mg capsule, Take 400  mg by mouth daily., Disp: , Rfl:  . hydrOXYzine HCL (ATARAX) 25 MG tablet, , Disp: , Rfl:  . lancets Misc, USE TO CHECK BLOOD SUGAR TWICE DAILY, Disp: , Rfl:  . metFORMIN XR (GLUCOPHAGE-XR) 750 MG 24 hr tablet, , Disp: , Rfl:  . pioglitazone (ACTOS) 45 MG tablet, , Disp: , Rfl:  . traMADoL (ULTRAM) 50 mg tablet, Take by mouth., Disp: , Rfl:  . vit C/E/Zn/coppr/lutein/zeaxan (PRESERVISION AREDS-2 ORAL), Take by mouth., Disp: , Rfl:   A complete ROS was performed with pertinent positives/negatives noted in the HPI. The remainder of the ROS are negative.   Physical Exam:   BP 127/83  Pulse 69  Temp 97.7 F (36.5 C)  Ht 1.6 m (5\' 3" )  Wt 88.5 kg (195 lb)  BMI 34.54 kg/m   General: Healthy and alert, in no distress, breathing easily. Normal affect. In a pleasant mood. Head: Normocephalic, atraumatic. No masses, or scars. Eyes: Pupils are equal, and reactive to light. Vision is grossly intact. No spontaneous or gaze nystagmus. Ears: External ears look normal and healthy except for the superior aspect of the left helix with a tender nodular growth about 1 cm. Ear canals are clear. Tympanic membranes are intact, with normal landmarks and the middle ears are clear and healthy. Hearing: Grossly normal. Nose: Nasal cavities are clear with  healthy mucosa, no polyps or exudate. Airways are patent. Face: No masses or scars, facial nerve function is symmetric. Oral Cavity: No mucosal abnormalities are noted. Tongue with normal mobility. Dentition appears healthy. Oropharynx: Tonsils are symmetric. There are no mucosal masses identified. Tongue base appears normal and healthy. Larynx/Hypopharynx: deferred Chest: Deferred Neck: No palpable masses, no cervical adenopathy, no thyroid nodules or enlargement. Neuro: Cranial nerves II-XII with normal function. Balance: Normal gate. Other findings: none.  Independent Review of Additional Tests or Records:  none  Procedures:  none  Impression &  Plans:  Painful growth left external ear. Two likely causes are early skin cancer versus Winkler's disease (chondrodermatitis nodularis helicis). In either case it should be removed and that should take care of it. We will schedule something at the outpatient center under local anesthesia.

## 2020-06-12 ENCOUNTER — Other Ambulatory Visit: Payer: Self-pay

## 2020-06-12 ENCOUNTER — Encounter (HOSPITAL_BASED_OUTPATIENT_CLINIC_OR_DEPARTMENT_OTHER): Payer: Self-pay | Admitting: Otolaryngology

## 2020-06-14 ENCOUNTER — Other Ambulatory Visit (HOSPITAL_COMMUNITY)
Admission: RE | Admit: 2020-06-14 | Discharge: 2020-06-14 | Disposition: A | Discharge status: Home / Self Care | Payer: PPO | Source: Ambulatory Visit | Attending: Otolaryngology | Admitting: Otolaryngology

## 2020-06-14 ENCOUNTER — Other Ambulatory Visit (HOSPITAL_COMMUNITY): Admission: RE | Admit: 2020-06-14 | Payer: PPO | Source: Ambulatory Visit

## 2020-06-14 DIAGNOSIS — Z20822 Contact with and (suspected) exposure to covid-19: Secondary | ICD-10-CM | POA: Insufficient documentation

## 2020-06-14 DIAGNOSIS — Z01812 Encounter for preprocedural laboratory examination: Secondary | ICD-10-CM | POA: Diagnosis not present

## 2020-06-14 LAB — SARS CORONAVIRUS 2 (TAT 6-24 HRS): SARS Coronavirus 2: NEGATIVE

## 2020-06-15 ENCOUNTER — Encounter (HOSPITAL_BASED_OUTPATIENT_CLINIC_OR_DEPARTMENT_OTHER): Payer: Self-pay | Admitting: Anesthesiology

## 2020-06-16 ENCOUNTER — Other Ambulatory Visit: Payer: Self-pay | Admitting: Internal Medicine

## 2020-06-16 ENCOUNTER — Encounter (HOSPITAL_BASED_OUTPATIENT_CLINIC_OR_DEPARTMENT_OTHER): Payer: Self-pay | Admitting: Otolaryngology

## 2020-06-16 ENCOUNTER — Ambulatory Visit (HOSPITAL_BASED_OUTPATIENT_CLINIC_OR_DEPARTMENT_OTHER)
Admission: RE | Admit: 2020-06-16 | Discharge: 2020-06-16 | Disposition: A | Discharge status: Home / Self Care | Payer: PPO | Source: Ambulatory Visit | Attending: Otolaryngology | Admitting: Otolaryngology

## 2020-06-16 ENCOUNTER — Encounter (HOSPITAL_BASED_OUTPATIENT_CLINIC_OR_DEPARTMENT_OTHER)
Admission: RE | Disposition: A | Discharge status: Home / Self Care | Payer: Self-pay | Source: Ambulatory Visit | Attending: Otolaryngology

## 2020-06-16 DIAGNOSIS — Z7982 Long term (current) use of aspirin: Secondary | ICD-10-CM | POA: Diagnosis not present

## 2020-06-16 DIAGNOSIS — H61002 Unspecified perichondritis of left external ear: Secondary | ICD-10-CM | POA: Diagnosis not present

## 2020-06-16 DIAGNOSIS — S92354A Nondisplaced fracture of fifth metatarsal bone, right foot, initial encounter for closed fracture: Secondary | ICD-10-CM | POA: Diagnosis not present

## 2020-06-16 DIAGNOSIS — M79671 Pain in right foot: Secondary | ICD-10-CM

## 2020-06-16 DIAGNOSIS — Z7984 Long term (current) use of oral hypoglycemic drugs: Secondary | ICD-10-CM | POA: Insufficient documentation

## 2020-06-16 DIAGNOSIS — Z87891 Personal history of nicotine dependence: Secondary | ICD-10-CM | POA: Diagnosis not present

## 2020-06-16 DIAGNOSIS — H61012 Acute perichondritis of left external ear: Secondary | ICD-10-CM | POA: Diagnosis not present

## 2020-06-16 DIAGNOSIS — H938X2 Other specified disorders of left ear: Secondary | ICD-10-CM | POA: Diagnosis present

## 2020-06-16 HISTORY — DX: Chronic obstructive pulmonary disease, unspecified: J44.9

## 2020-06-16 HISTORY — PX: MASS EXCISION: SHX2000

## 2020-06-16 SURGERY — MINOR EXCISION OF MASS
Anesthesia: LOCAL | Site: Ear | Laterality: Left

## 2020-06-16 MED ORDER — BUPIVACAINE HCL (PF) 0.25 % IJ SOLN
INTRAMUSCULAR | Status: AC
  Filled 2020-06-16: qty 30

## 2020-06-16 MED ORDER — CEPHALEXIN 500 MG PO CAPS: 500.0000 mg | ORAL_CAPSULE | Freq: Three times a day (TID) | ORAL | 0 refills | Status: DC

## 2020-06-16 MED ORDER — BSS IO SOLN
INTRAOCULAR | Status: AC
  Filled 2020-06-16: qty 15

## 2020-06-16 MED ORDER — SILVER NITRATE-POT NITRATE 75-25 % EX MISC
CUTANEOUS | Status: AC
  Filled 2020-06-16: qty 10

## 2020-06-16 MED ORDER — LIDOCAINE-EPINEPHRINE 1 %-1:100000 IJ SOLN
INTRAMUSCULAR | Status: AC
  Filled 2020-06-16: qty 1

## 2020-06-16 MED ORDER — LIDOCAINE-EPINEPHRINE 1 %-1:100000 IJ SOLN
INTRAMUSCULAR | Status: DC | PRN
  Administered 2020-06-16: 1 mL

## 2020-06-16 MED ORDER — BACITRACIN ZINC 500 UNIT/GM EX OINT
TOPICAL_OINTMENT | CUTANEOUS | Status: AC
  Filled 2020-06-16: qty 0.9

## 2020-06-16 MED ORDER — METHYLENE BLUE 0.5 % INJ SOLN
INTRAVENOUS | Status: AC
  Filled 2020-06-16: qty 10

## 2020-06-16 MED ORDER — EPINEPHRINE PF 1 MG/ML IJ SOLN
INTRAMUSCULAR | Status: AC
  Filled 2020-06-16: qty 1

## 2020-06-16 MED ORDER — BACITRACIN 500 UNIT/GM EX OINT
TOPICAL_OINTMENT | CUTANEOUS | Status: DC | PRN
  Administered 2020-06-16: 1 via TOPICAL

## 2020-06-16 SURGICAL SUPPLY — 47 items
ADH SKN CLS APL DERMABOND .7 (GAUZE/BANDAGES/DRESSINGS)
APL SWBSTK 6 STRL LF DISP (MISCELLANEOUS)
APPLICATOR COTTON TIP 6 STRL (MISCELLANEOUS) IMPLANT
APPLICATOR COTTON TIP 6IN STRL (MISCELLANEOUS)
BALL CTTN LRG ABS STRL LF (GAUZE/BANDAGES/DRESSINGS)
BLADE SURG 15 STRL LF DISP TIS (BLADE) ×2 IMPLANT
BLADE SURG 15 STRL SS (BLADE) ×3
CANISTER SUCT 1200ML W/VALVE (MISCELLANEOUS) ×3 IMPLANT
CLEANER CAUTERY TIP 5X5 PAD (MISCELLANEOUS) IMPLANT
COTTONBALL LRG STERILE PKG (GAUZE/BANDAGES/DRESSINGS) ×1 IMPLANT
COVER BACK TABLE 60X90IN (DRAPES) ×3 IMPLANT
COVER MAYO STAND STRL (DRAPES) ×3 IMPLANT
COVER WAND RF STERILE (DRAPES) IMPLANT
DECANTER SPIKE VIAL GLASS SM (MISCELLANEOUS) ×3 IMPLANT
DERMABOND ADVANCED (GAUZE/BANDAGES/DRESSINGS)
DERMABOND ADVANCED .7 DNX12 (GAUZE/BANDAGES/DRESSINGS) IMPLANT
ELECT COATED BLADE 2.86 ST (ELECTRODE) IMPLANT
ELECT NDL BLADE 2-5/6 (NEEDLE) ×1 IMPLANT
ELECT NEEDLE BLADE 2-5/6 (NEEDLE) ×3 IMPLANT
ELECT REM PT RETURN 9FT ADLT (ELECTROSURGICAL) ×3
ELECTRODE REM PT RTRN 9FT ADLT (ELECTROSURGICAL) ×2 IMPLANT
GLOVE ECLIPSE 6.5 STRL STRAW (GLOVE) ×2 IMPLANT
GLOVE ECLIPSE 7.5 STRL STRAW (GLOVE) ×3 IMPLANT
GOWN STRL REUS W/ TWL LRG LVL3 (GOWN DISPOSABLE) ×1 IMPLANT
GOWN STRL REUS W/ TWL XL LVL3 (GOWN DISPOSABLE) ×2 IMPLANT
GOWN STRL REUS W/TWL LRG LVL3 (GOWN DISPOSABLE) ×3
GOWN STRL REUS W/TWL XL LVL3 (GOWN DISPOSABLE) ×3
IV SET EXT 30 76VOL 4 MALE LL (IV SETS) ×1 IMPLANT
MARKER SKIN DUAL TIP RULER LAB (MISCELLANEOUS) IMPLANT
NDL PRECISIONGLIDE 27X1.5 (NEEDLE) ×1 IMPLANT
NEEDLE PRECISIONGLIDE 27X1.5 (NEEDLE) ×3 IMPLANT
NS IRRIG 1000ML POUR BTL (IV SOLUTION) ×2 IMPLANT
PACK BASIN DAY SURGERY FS (CUSTOM PROCEDURE TRAY) ×3 IMPLANT
PAD CLEANER CAUTERY TIP 5X5 (MISCELLANEOUS)
PENCIL FOOT CONTROL (ELECTRODE) ×3 IMPLANT
SHEET MEDIUM DRAPE 40X70 STRL (DRAPES) ×3 IMPLANT
SPONGE SURGIFOAM ABS GEL 12-7 (HEMOSTASIS) IMPLANT
SUT CHROMIC 3 0 PS 2 (SUTURE) ×2 IMPLANT
SUT CHROMIC 4 0 P 3 18 (SUTURE) IMPLANT
SUT PLAIN 5 0 P 3 18 (SUTURE) IMPLANT
SUT VIC AB 5-0 P-3 18X BRD (SUTURE) IMPLANT
SUT VIC AB 5-0 P3 18 (SUTURE)
SWABSTICK POVIDONE IODINE SNGL (MISCELLANEOUS) ×4 IMPLANT
SYR CONTROL 10ML LL (SYRINGE) ×3 IMPLANT
TOWEL GREEN STERILE FF (TOWEL DISPOSABLE) ×3 IMPLANT
TRAY DSU PREP LF (CUSTOM PROCEDURE TRAY) IMPLANT
TUBE CONNECTING 20X1/4 (TUBING) ×3 IMPLANT

## 2020-06-16 NOTE — Interval H&P Note (Signed)
History and Physical Interval Note:  06/16/2020 9:52 AM  [name].  has presented today for surgery, with the diagnosis of Ear lesion.  The various methods of treatment have been discussed with the patient and family. After consideration of risks, benefits and other options for treatment, the patient has consented to  Procedure(s): EXCISION MASS (Left) as a surgical intervention.  The patient's history has been reviewed, patient examined, no change in status, stable for surgery.  I have reviewed the patient's chart and labs.  Questions were answered to the patient's satisfaction.     Izora Gala

## 2020-06-16 NOTE — Op Note (Signed)
OPERATIVE REPORT  DATE OF SURGERY: 06/16/2020  PATIENT:  Chase Mage.,  75 y.o. male  PRE-OPERATIVE DIAGNOSIS:  Ear lesion  POST-OPERATIVE DIAGNOSIS:  Ear lesion  PROCEDURE:  Procedure(s): MINOR EXCISION OF MASS  SURGEON:  Beckie Salts, MD  ASSISTANTS: None  ANESTHESIA:   General   EBL: 5 ml  DRAINS: None  LOCAL MEDICATIONS USED: 1% Xylocaine with epinephrine  SPECIMEN: Left auricular skin lesion  COUNTS:  Correct  PROCEDURE DETAILS: The patient was taken to the operating room and placed on the operating table in the supine position.  The left ear lesion was identified and a marking pen was used to mark the proposed incisions.  It was about a 1 cm raised lesion along the superior helix anteriorly, and a total of approximately 2 cm ellipse was outlined.  Local anesthetic was infiltrated.  A #15 scalpel was used to excise the lesion including the underlying cartilage.  Electrocautery was used to provide hemostasis.  The lesion was sent for pathologic evaluation.  The defect was closed from back to front using interrupted 3-0 chromic sutures.  Topical bacitracin was applied.  Patient was then transferred back to the preop area for discharge.  He tolerated this well.    PATIENT DISPOSITION:  To PACU, stable

## 2020-06-16 NOTE — Discharge Instructions (Signed)
Keep the ear clean and dry.  Apply bacitracin and antibiotic ointment twice daily.

## 2020-06-19 ENCOUNTER — Encounter (HOSPITAL_BASED_OUTPATIENT_CLINIC_OR_DEPARTMENT_OTHER): Payer: Self-pay | Admitting: Otolaryngology

## 2020-06-19 LAB — SURGICAL PATHOLOGY

## 2020-06-20 ENCOUNTER — Other Ambulatory Visit: Payer: Self-pay

## 2020-06-20 ENCOUNTER — Other Ambulatory Visit: Payer: Self-pay | Admitting: Internal Medicine

## 2020-06-21 ENCOUNTER — Ambulatory Visit (INDEPENDENT_AMBULATORY_CARE_PROVIDER_SITE_OTHER): Payer: PPO | Admitting: Internal Medicine

## 2020-06-21 ENCOUNTER — Encounter: Payer: Self-pay | Admitting: Internal Medicine

## 2020-06-21 ENCOUNTER — Other Ambulatory Visit: Payer: Self-pay

## 2020-06-21 VITALS — BP 130/72 | HR 64 | Temp 97.7°F | Ht 64.0 in | Wt 194.6 lb

## 2020-06-21 DIAGNOSIS — Z125 Encounter for screening for malignant neoplasm of prostate: Secondary | ICD-10-CM | POA: Diagnosis not present

## 2020-06-21 DIAGNOSIS — E119 Type 2 diabetes mellitus without complications: Secondary | ICD-10-CM | POA: Diagnosis not present

## 2020-06-21 DIAGNOSIS — Z0000 Encounter for general adult medical examination without abnormal findings: Secondary | ICD-10-CM

## 2020-06-21 LAB — HEMOGLOBIN A1C: Hgb A1c MFr Bld: 6.7 % — ABNORMAL HIGH (ref 4.6–6.5)

## 2020-06-21 LAB — CBC WITH DIFFERENTIAL/PLATELET
Basophils Absolute: 0.1 10*3/uL (ref 0.0–0.1)
Basophils Relative: 1 % (ref 0.0–3.0)
Eosinophils Absolute: 0.3 10*3/uL (ref 0.0–0.7)
Eosinophils Relative: 6.7 % — ABNORMAL HIGH (ref 0.0–5.0)
HCT: 41.9 % (ref 39.0–52.0)
Hemoglobin: 14.6 g/dL (ref 13.0–17.0)
Lymphocytes Relative: 27.1 % (ref 12.0–46.0)
Lymphs Abs: 1.4 10*3/uL (ref 0.7–4.0)
MCHC: 34.9 g/dL (ref 30.0–36.0)
MCV: 95.7 fl (ref 78.0–100.0)
Monocytes Absolute: 0.6 10*3/uL (ref 0.1–1.0)
Monocytes Relative: 12 % (ref 3.0–12.0)
Neutro Abs: 2.7 10*3/uL (ref 1.4–7.7)
Neutrophils Relative %: 53.2 % (ref 43.0–77.0)
Platelets: 236 10*3/uL (ref 150.0–400.0)
RBC: 4.38 Mil/uL (ref 4.22–5.81)
RDW: 14.3 % (ref 11.5–15.5)
WBC: 5 10*3/uL (ref 4.0–10.5)

## 2020-06-21 LAB — COMPREHENSIVE METABOLIC PANEL
ALT: 19 U/L (ref 0–53)
AST: 18 U/L (ref 0–37)
Albumin: 3.9 g/dL (ref 3.5–5.2)
Alkaline Phosphatase: 86 U/L (ref 39–117)
BUN: 15 mg/dL (ref 6–23)
CO2: 32 mEq/L (ref 19–32)
Calcium: 9.8 mg/dL (ref 8.4–10.5)
Chloride: 101 mEq/L (ref 96–112)
Creatinine, Ser: 0.92 mg/dL (ref 0.40–1.50)
GFR: 81.03 mL/min (ref >60.00)
Glucose, Bld: 129 mg/dL — ABNORMAL HIGH (ref 70–99)
Potassium: 4.6 mEq/L (ref 3.5–5.1)
Sodium: 139 mEq/L (ref 135–145)
Total Bilirubin: 0.5 mg/dL (ref 0.2–1.2)
Total Protein: 6.6 g/dL (ref 6.0–8.3)

## 2020-06-21 LAB — LIPID PANEL
Cholesterol: 203 mg/dL — ABNORMAL HIGH (ref 0–200)
HDL: 84.6 mg/dL (ref >39.00)
LDL Cholesterol: 103 mg/dL — ABNORMAL HIGH (ref 0–99)
NonHDL: 118.67
Total CHOL/HDL Ratio: 2
Triglycerides: 77 mg/dL (ref 0.0–149.0)
VLDL: 15.4 mg/dL (ref 0.0–40.0)

## 2020-06-21 LAB — MICROALBUMIN / CREATININE URINE RATIO
Creatinine,U: 140.2 mg/dL
Microalb Creat Ratio: 1.3 mg/g (ref 0.0–30.0)
Microalb, Ur: 1.9 mg/dL (ref 0.0–1.9)

## 2020-06-21 LAB — PSA: PSA: 1.1 ng/mL (ref 0.10–4.00)

## 2020-06-21 LAB — URINALYSIS
Bilirubin Urine: NEGATIVE
Hgb urine dipstick: NEGATIVE
Ketones, ur: NEGATIVE
Leukocytes,Ua: NEGATIVE
Nitrite: NEGATIVE
Specific Gravity, Urine: 1.025 (ref 1.000–1.030)
Total Protein, Urine: NEGATIVE
Urine Glucose: NEGATIVE
Urobilinogen, UA: 0.2 (ref 0.0–1.0)
pH: 6 (ref 5.0–8.0)

## 2020-06-21 LAB — TSH: TSH: 1.98 u[IU]/mL (ref 0.35–4.50)

## 2020-06-21 NOTE — Progress Notes (Signed)
Subjective:  Patient ID: [name]., male    DOB: 01-29-1946  Age: 75 y.o. MRN: [id number]  CC: Annual Exam   HPI Tomasz Steeves. presents for a well exam R foot in a boot   Outpatient Medications Prior to Visit  Medication Sig Dispense Refill  . acetaminophen (TYLENOL) 650 MG CR tablet Take 650 mg by mouth 2 (two) times daily as needed for pain.     [name] albuterol (PROAIR HFA) 108 (90 Base) MCG/ACT inhaler Inhale 2 puffs into the lungs every 6 (six) hours as needed for wheezing (cough). 18 g 3  . amLODipine (NORVASC) 10 MG tablet TAKE 1 TABLET (10 MG TOTAL) BY MOUTH DAILY BEFORE BREAKFAST. (Patient taking differently: Take 5 mg by mouth daily before breakfast.) 90 tablet 3  . aspirin EC 81 MG tablet Take 81 mg by mouth daily.    . Blood Glucose Monitoring Suppl (ONETOUCH VERIO) w/Device KIT 1 Units by Does not apply route daily as needed. 1 kit 1  . carvedilol (COREG) 25 MG tablet TAKE 1 TABLET BY MOUTH TWICE A DAY WITH MEALS 180 tablet 3  . Cholecalciferol (VITAMIN D3) 1000 UNITS tablet Take 2,000 Units by mouth daily.    . Coenzyme Q10 (CO Q-10) 200 MG CAPS Take 400 mg by mouth daily.     . Folic Acid 0.8 MG CAPS Take 2 capsules by mouth every morning.  30 each 0  . glucose blood (ONETOUCH VERIO) test strip USE TO CHECK BLOOD SUGAR DAILY 100 strip 5  . glucose blood test strip USE TO TEST BLOOD SUGAR TWICE A DAY 100 each 3  . hydrOXYzine (ATARAX/VISTARIL) 25 MG tablet TAKE 1-2 TABLETS AT BEDTIME AS NEED FOR INSOMNIA 180 tablet 1  . KRILL OIL PO Take by mouth.    . metFORMIN (GLUCOPHAGE-XR) 750 MG 24 hr tablet TAKE 1 TABLET BY MOUTH EVERY DAY WITH BREAKFAST 90 tablet 2  . OneTouch Delica Lancets 01X MISC USE TO CHECK BLOOD SUGAR TWICE DAILY 100 each 3  . pioglitazone (ACTOS) 45 MG tablet TAKE 1 TABLET (45 MG TOTAL) BY MOUTH EVERY OTHER DAY. 45 tablet 3  . traMADol (ULTRAM) 50 MG tablet Take by mouth every 6 (six) hours as needed.    . cephALEXin (KEFLEX) 500 MG capsule  Take 1 capsule (500 mg total) by mouth 3 (three) times daily. (Patient not taking: Reported on 06/21/2020) 15 capsule 0  . cephALEXin (KEFLEX) 500 MG capsule Take 1 capsule (500 mg total) by mouth 3 (three) times daily. 15 capsule 0  . Multiple Vitamins-Minerals (PRESERVISION AREDS 2+MULTI VIT PO)  (Patient not taking: Reported on 06/21/2020)     No facility-administered medications prior to visit.    ROS: Review of Systems  Constitutional: Negative for appetite change, fatigue and unexpected weight change.  HENT: Negative for congestion, nosebleeds, sneezing, sore throat and trouble swallowing.   Eyes: Negative for itching and visual disturbance.  Respiratory: Negative for cough.   Cardiovascular: Negative for chest pain, palpitations and leg swelling.  Gastrointestinal: Negative for abdominal distention, blood in stool, diarrhea and nausea.  Genitourinary: Negative for frequency and hematuria.  Musculoskeletal: Negative for back pain, gait problem, joint swelling and neck pain.  Skin: Negative for rash.  Neurological: Negative for dizziness, tremors, speech difficulty and weakness.  Psychiatric/Behavioral: Negative for agitation, dysphoric mood and sleep disturbance. The patient is not nervous/anxious.     Objective:  BP 130/72 (BP Location: Left Arm)   Pulse 64  Temp 97.7 F (36.5 C) (Oral)   Ht '5\' 4"'  (1.626 m)   Wt 194 lb 9.6 oz (88.3 kg)   SpO2 95%   BMI 33.40 kg/m   BP Readings from Last 3 Encounters:  06/21/20 130/72  06/16/20 136/68  04/19/20 140/78    Wt Readings from Last 3 Encounters:  06/21/20 194 lb 9.6 oz (88.3 kg)  06/16/20 192 lb 3.9 oz (87.2 kg)  04/19/20 193 lb 12.8 oz (87.9 kg)    Physical Exam Constitutional:      General: He is not in acute distress.    Appearance: He is well-developed.     Comments: NAD  HENT:     Mouth/Throat:     Mouth: Oropharynx is clear and moist.  Eyes:     Conjunctiva/sclera: Conjunctivae normal.     Pupils: Pupils  are equal, round, and reactive to light.  Neck:     Thyroid: No thyromegaly.     Vascular: No JVD.  Cardiovascular:     Rate and Rhythm: Normal rate and regular rhythm.     Pulses: Intact distal pulses.     Heart sounds: Normal heart sounds. No murmur heard. No friction rub. No gallop.   Pulmonary:     Effort: Pulmonary effort is normal. No respiratory distress.     Breath sounds: Normal breath sounds. No wheezing or rales.  Chest:     Chest wall: No tenderness.  Abdominal:     General: Bowel sounds are normal. There is no distension.     Palpations: Abdomen is soft. There is no mass.     Tenderness: There is no abdominal tenderness. There is no guarding or rebound.  Musculoskeletal:        General: No tenderness or edema. Normal range of motion.     Cervical back: Normal range of motion.  Lymphadenopathy:     Cervical: No cervical adenopathy.  Skin:    General: Skin is warm and dry.     Findings: No rash.  Neurological:     Mental Status: He is alert and oriented to person, place, and time.     Cranial Nerves: No cranial nerve deficit.     Motor: No abnormal muscle tone.     Coordination: He displays a negative Romberg sign. Coordination normal.     Gait: Gait normal.     Deep Tendon Reflexes: Reflexes are normal and symmetric.  Psychiatric:        Mood and Affect: Mood and affect normal.        Behavior: Behavior normal.        Thought Content: Thought content normal.        Judgment: Judgment normal.    R foot is in the surgical shoe Rectal - per GI later this year   Lab Results  Component Value Date   WBC 5.5 06/16/2019   HGB 15.6 06/16/2019   HCT 47.1 06/16/2019   PLT 207.0 06/16/2019   GLUCOSE 139 (H) 02/22/2020   CHOL 211 (H) 06/16/2019   TRIG 83.0 06/16/2019   HDL 88.10 06/16/2019   LDLCALC 106 (H) 06/16/2019   ALT 17 06/16/2019   AST 18 06/16/2019   NA 136 02/22/2020   K 4.8 02/22/2020   CL 101 02/22/2020   CREATININE 0.89 02/22/2020   BUN 15  02/22/2020   CO2 31 02/22/2020   TSH 1.66 06/16/2019   PSA 0.95 06/16/2019   HGBA1C 6.9 (H) 02/22/2020   MICROALBUR 1.4 05/18/2018    No  results found.  Assessment & Plan:    [name], MD

## 2020-06-21 NOTE — Assessment & Plan Note (Signed)
  We discussed age appropriate health related issues, including available/recomended screening tests and vaccinations. Labs were ordered to be later reviewed . All questions were answered. We discussed one or more of the following - seat belt use, use of sunscreen/sun exposure exercise, safe sex, fall risk reduction, second hand smoke exposure, firearm use and storage, seat belt use, a need for adhering to healthy diet and exercise. Labs were ordered.  All questions were answered.  Colon due 11/22

## 2020-06-21 NOTE — Assessment & Plan Note (Signed)
Metfomin XR, Actos A1c

## 2020-06-23 ENCOUNTER — Telehealth: Payer: Self-pay | Admitting: Internal Medicine

## 2020-06-23 NOTE — Telephone Encounter (Signed)
Recv'd records from [facility] forwarded 2 pages to Dr. [name] 1/28/2022fbg

## 2020-06-27 ENCOUNTER — Other Ambulatory Visit: Payer: Self-pay | Admitting: Internal Medicine

## 2020-06-27 DIAGNOSIS — H61002 Unspecified perichondritis of left external ear: Secondary | ICD-10-CM | POA: Insufficient documentation

## 2020-07-10 DIAGNOSIS — S92354D Nondisplaced fracture of fifth metatarsal bone, right foot, subsequent encounter for fracture with routine healing: Secondary | ICD-10-CM | POA: Diagnosis not present

## 2020-07-31 DIAGNOSIS — S92354D Nondisplaced fracture of fifth metatarsal bone, right foot, subsequent encounter for fracture with routine healing: Secondary | ICD-10-CM | POA: Diagnosis not present

## 2020-08-03 ENCOUNTER — Other Ambulatory Visit: Payer: Self-pay | Admitting: Internal Medicine

## 2020-08-12 ENCOUNTER — Other Ambulatory Visit: Payer: Self-pay | Admitting: Internal Medicine

## 2020-09-14 DIAGNOSIS — H903 Sensorineural hearing loss, bilateral: Secondary | ICD-10-CM | POA: Diagnosis not present

## 2020-10-02 ENCOUNTER — Other Ambulatory Visit: Payer: Self-pay | Admitting: Internal Medicine

## 2020-10-18 ENCOUNTER — Telehealth (INDEPENDENT_AMBULATORY_CARE_PROVIDER_SITE_OTHER): Payer: PPO | Admitting: Internal Medicine

## 2020-10-18 ENCOUNTER — Encounter: Payer: Self-pay | Admitting: Internal Medicine

## 2020-10-18 DIAGNOSIS — E119 Type 2 diabetes mellitus without complications: Secondary | ICD-10-CM

## 2020-10-18 DIAGNOSIS — I2584 Coronary atherosclerosis due to calcified coronary lesion: Secondary | ICD-10-CM

## 2020-10-18 DIAGNOSIS — I10 Essential (primary) hypertension: Secondary | ICD-10-CM | POA: Diagnosis not present

## 2020-10-18 DIAGNOSIS — E785 Hyperlipidemia, unspecified: Secondary | ICD-10-CM

## 2020-10-18 DIAGNOSIS — Z0000 Encounter for general adult medical examination without abnormal findings: Secondary | ICD-10-CM

## 2020-10-18 DIAGNOSIS — J45909 Unspecified asthma, uncomplicated: Secondary | ICD-10-CM | POA: Diagnosis not present

## 2020-10-18 DIAGNOSIS — I2510 Atherosclerotic heart disease of native coronary artery without angina pectoris: Secondary | ICD-10-CM

## 2020-10-18 NOTE — Assessment & Plan Note (Signed)
Continue with Coreg, amlodipine.  Obtain c-Met

## 2020-10-18 NOTE — Progress Notes (Signed)
Virtual Visit via Video Note  I connected with [name]. on 10/18/20 at  9:30 AM EDT by a video enabled telemedicine application and verified that I am speaking with the correct person using two identifiers.   I discussed the limitations of evaluation and management by telemedicine and the availability of in person appointments. The patient expressed understanding and agreed to proceed.  I was located at  home office. The patient was at home. [name] was present in the visit.   History of Present Illness: We need to follow-up on diabetes, hypertension, dyslipidemia.  [name] continues to have coughing spells almost daily.  He has been using his Ventolin inhaler as needed   Observations/Objective: The patient appears to be in no acute distress, looks well.  Assessment and Plan:  See my Assessment and Plan. Follow Up Instructions:    I discussed the assessment and treatment plan with the patient. The patient was provided an opportunity to ask questions and all were answered. The patient agreed with the plan and demonstrated an understanding of the instructions.   The patient was advised to call back or seek an in-person evaluation if the symptoms worsen or if the condition fails to improve as anticipated.  I provided face-to-face time during this encounter. We were at different locations.   Chase Kehr, MD

## 2020-10-18 NOTE — Assessment & Plan Note (Signed)
Continue with metformin XR and Actos.  Obtain A1c

## 2020-10-18 NOTE — Assessment & Plan Note (Signed)
Use Ventolin 2 puffs twice a day on daily basis (on schedule) and more often if needed for coughing spells

## 2020-10-18 NOTE — Assessment & Plan Note (Signed)
Continue with pravastatin 

## 2020-10-19 ENCOUNTER — Other Ambulatory Visit (INDEPENDENT_AMBULATORY_CARE_PROVIDER_SITE_OTHER): Payer: PPO

## 2020-10-19 DIAGNOSIS — E119 Type 2 diabetes mellitus without complications: Secondary | ICD-10-CM

## 2020-10-19 DIAGNOSIS — Z0000 Encounter for general adult medical examination without abnormal findings: Secondary | ICD-10-CM | POA: Diagnosis not present

## 2020-10-19 DIAGNOSIS — E785 Hyperlipidemia, unspecified: Secondary | ICD-10-CM

## 2020-10-19 LAB — CBC WITH DIFFERENTIAL/PLATELET
Basophils Absolute: 0.1 10*3/uL (ref 0.0–0.1)
Basophils Relative: 1.2 % (ref 0.0–3.0)
Eosinophils Absolute: 0.4 10*3/uL (ref 0.0–0.7)
Eosinophils Relative: 6.1 % — ABNORMAL HIGH (ref 0.0–5.0)
HCT: 48.2 % (ref 39.0–52.0)
Hemoglobin: 16.4 g/dL (ref 13.0–17.0)
Lymphocytes Relative: 29.1 % (ref 12.0–46.0)
Lymphs Abs: 1.7 10*3/uL (ref 0.7–4.0)
MCHC: 34 g/dL (ref 30.0–36.0)
MCV: 92 fl (ref 78.0–100.0)
Monocytes Absolute: 0.7 10*3/uL (ref 0.1–1.0)
Monocytes Relative: 12.1 % — ABNORMAL HIGH (ref 3.0–12.0)
Neutro Abs: 3 10*3/uL (ref 1.4–7.7)
Neutrophils Relative %: 51.5 % (ref 43.0–77.0)
Platelets: 226 10*3/uL (ref 150.0–400.0)
RBC: 5.24 Mil/uL (ref 4.22–5.81)
RDW: 15.1 % (ref 11.5–15.5)
WBC: 5.9 10*3/uL (ref 4.0–10.5)

## 2020-10-19 LAB — URINALYSIS
Bilirubin Urine: NEGATIVE
Ketones, ur: NEGATIVE
Leukocytes,Ua: NEGATIVE
Nitrite: NEGATIVE
Specific Gravity, Urine: >=1.03 — AB (ref 1.000–1.030)
Urine Glucose: NEGATIVE
Urobilinogen, UA: 0.2 (ref 0.0–1.0)
pH: 5.5 (ref 5.0–8.0)

## 2020-10-19 LAB — COMPREHENSIVE METABOLIC PANEL
ALT: 17 U/L (ref 0–53)
AST: 18 U/L (ref 0–37)
Albumin: 4.1 g/dL (ref 3.5–5.2)
Alkaline Phosphatase: 95 U/L (ref 39–117)
BUN: 12 mg/dL (ref 6–23)
CO2: 25 mEq/L (ref 19–32)
Calcium: 9.7 mg/dL (ref 8.4–10.5)
Chloride: 103 mEq/L (ref 96–112)
Creatinine, Ser: 0.84 mg/dL (ref 0.40–1.50)
GFR: 84.75 mL/min (ref >60.00)
Glucose, Bld: 130 mg/dL — ABNORMAL HIGH (ref 70–99)
Potassium: 4.5 mEq/L (ref 3.5–5.1)
Sodium: 138 mEq/L (ref 135–145)
Total Bilirubin: 0.6 mg/dL (ref 0.2–1.2)
Total Protein: 6.8 g/dL (ref 6.0–8.3)

## 2020-10-19 LAB — PSA: PSA: 1.13 ng/mL (ref 0.10–4.00)

## 2020-10-19 LAB — LIPID PANEL
Cholesterol: 216 mg/dL — ABNORMAL HIGH (ref 0–200)
HDL: 81.5 mg/dL (ref >39.00)
LDL Cholesterol: 115 mg/dL — ABNORMAL HIGH (ref 0–99)
NonHDL: 134.24
Total CHOL/HDL Ratio: 3
Triglycerides: 94 mg/dL (ref 0.0–149.0)
VLDL: 18.8 mg/dL (ref 0.0–40.0)

## 2020-10-19 LAB — HEMOGLOBIN A1C: Hgb A1c MFr Bld: 7 % — ABNORMAL HIGH (ref 4.6–6.5)

## 2020-10-19 LAB — TSH: TSH: 3.09 u[IU]/mL (ref 0.35–4.50)

## 2020-11-08 ENCOUNTER — Ambulatory Visit: Payer: PPO

## 2020-11-14 ENCOUNTER — Ambulatory Visit: Payer: PPO

## 2020-11-23 ENCOUNTER — Ambulatory Visit (INDEPENDENT_AMBULATORY_CARE_PROVIDER_SITE_OTHER): Payer: PPO

## 2020-11-23 DIAGNOSIS — Z0000 Encounter for general adult medical examination without abnormal findings: Secondary | ICD-10-CM

## 2020-11-23 NOTE — Patient Instructions (Signed)
[name] , Thank you for taking time to come for your Medicare Wellness Visit. I appreciate your ongoing commitment to your health goals. Please review the following plan we discussed and let me know if I can assist you in the future.   Screening recommendations/referrals: Colonoscopy: last done 04/11/2011; no repeat due to age. Recommended yearly ophthalmology/optometry visit for glaucoma screening and checkup Recommended yearly dental visit for hygiene and checkup  Vaccinations: Influenza vaccine: 02/22/2020 Pneumococcal vaccine: 11/13/2009, 07/23/2013 Tdap vaccine: 10/14/2019; due every 10 years Shingles vaccine: 06/07/2020, 08/16/2020   Covid-19: 07/02/2019, 07/27/2019, 04/18/2020  Advanced directives: Please bring a copy of your health care power of attorney and living will to the office at your convenience.  Conditions/risks identified: To maintain my current health status by continuing to eat healthy, stay physically active and socially active.  Next appointment: Please schedule your next Medicare Wellness Visit with your Nurse Health Advisor in 1 year by calling [phone].  Preventive Care 79 Years and Older, Male Preventive care refers to lifestyle choices and visits with your health care provider that can promote health and wellness. What does preventive care include? A yearly physical exam. This is also called an annual well check. Dental exams once or twice a year. Routine eye exams. Ask your health care provider how often you should have your eyes checked. Personal lifestyle choices, including: Daily care of your teeth and gums. Regular physical activity. Eating a healthy diet. Avoiding tobacco and drug use. Limiting alcohol use. Practicing safe sex. Taking low doses of aspirin every day. Taking vitamin and mineral supplements as recommended by your health care provider. What happens during an annual well check? The services and screenings done by your health care  provider during your annual well check will depend on your age, overall health, lifestyle risk factors, and family history of disease. Counseling  Your health care provider may ask you questions about your: Alcohol use. Tobacco use. Drug use. Emotional well-being. Home and relationship well-being. Sexual activity. Eating habits. History of falls. Memory and ability to understand (cognition). Work and work Statistician. Screening  You may have the following tests or measurements: Height, weight, and BMI. Blood pressure. Lipid and cholesterol levels. These may be checked every 5 years, or more frequently if you are over 72 years old. Skin check. Lung cancer screening. You may have this screening every year starting at age 40 if you have a 30-pack-year history of smoking and currently smoke or have quit within the past 15 years. Fecal occult blood test (FOBT) of the stool. You may have this test every year starting at age 40. Flexible sigmoidoscopy or colonoscopy. You may have a sigmoidoscopy every 5 years or a colonoscopy every 10 years starting at age 59. Prostate cancer screening. Recommendations will vary depending on your family history and other risks. Hepatitis C blood test. Hepatitis B blood test. Sexually transmitted disease (STD) testing. Diabetes screening. This is done by checking your blood sugar (glucose) after you have not eaten for a while (fasting). You may have this done every 1-3 years. Abdominal aortic aneurysm (AAA) screening. You may need this if you are a current or former smoker. Osteoporosis. You may be screened starting at age 51 if you are at high risk. Talk with your health care provider about your test results, treatment options, and if necessary, the need for more tests. Vaccines  Your health care provider may recommend certain vaccines, such as: Influenza vaccine. This is recommended every year. Tetanus, diphtheria, and acellular  pertussis (Tdap, Td)  vaccine. You may need a Td booster every 10 years. Zoster vaccine. You may need this after age 43. Pneumococcal 13-valent conjugate (PCV13) vaccine. One dose is recommended after age 39. Pneumococcal polysaccharide (PPSV23) vaccine. One dose is recommended after age 48. Talk to your health care provider about which screenings and vaccines you need and how often you need them. This information is not intended to replace advice given to you by your health care provider. Make sure you discuss any questions you have with your health care provider. Document Released: 06/09/2015 Document Revised: 01/31/2016 Document Reviewed: 03/14/2015 Elsevier Interactive Patient Education  2017 [employer] Prevention in the Home Falls can cause injuries. They can happen to people of all ages. There are many things you can do to make your home safe and to help prevent falls. What can I do on the outside of my home? Regularly fix the edges of walkways and driveways and fix any cracks. Remove anything that might make you trip as you walk through a door, such as a raised step or threshold. Trim any bushes or trees on the path to your home. Use bright outdoor lighting. Clear any walking paths of anything that might make someone trip, such as rocks or tools. Regularly check to see if handrails are loose or broken. Make sure that both sides of any steps have handrails. Any raised decks and porches should have guardrails on the edges. Have any leaves, snow, or ice cleared regularly. Use sand or salt on walking paths during winter. Clean up any spills in your garage right away. This includes oil or grease spills. What can I do in the bathroom? Use night lights. Install grab bars by the toilet and in the tub and shower. Do not use towel bars as grab bars. Use non-skid mats or decals in the tub or shower. If you need to sit down in the shower, use a plastic, non-slip stool. Keep the floor dry. Clean up any  water that spills on the floor as soon as it happens. Remove soap buildup in the tub or shower regularly. Attach bath mats securely with double-sided non-slip rug tape. Do not have throw rugs and other things on the floor that can make you trip. What can I do in the bedroom? Use night lights. Make sure that you have a light by your bed that is easy to reach. Do not use any sheets or blankets that are too big for your bed. They should not hang down onto the floor. Have a firm chair that has side arms. You can use this for support while you get dressed. Do not have throw rugs and other things on the floor that can make you trip. What can I do in the kitchen? Clean up any spills right away. Avoid walking on wet floors. Keep items that you use a lot in easy-to-reach places. If you need to reach something above you, use a strong step stool that has a grab bar. Keep electrical cords out of the way. Do not use floor polish or wax that makes floors slippery. If you must use wax, use non-skid floor wax. Do not have throw rugs and other things on the floor that can make you trip. What can I do with my stairs? Do not leave any items on the stairs. Make sure that there are handrails on both sides of the stairs and use them. Fix handrails that are broken or loose. Make sure that handrails  are as long as the stairways. Check any carpeting to make sure that it is firmly attached to the stairs. Fix any carpet that is loose or worn. Avoid having throw rugs at the top or bottom of the stairs. If you do have throw rugs, attach them to the floor with carpet tape. Make sure that you have a light switch at the top of the stairs and the bottom of the stairs. If you do not have them, ask someone to add them for you. What else can I do to help prevent falls? Wear shoes that: Do not have high heels. Have rubber bottoms. Are comfortable and fit you well. Are closed at the toe. Do not wear sandals. If you use a  stepladder: Make sure that it is fully opened. Do not climb a closed stepladder. Make sure that both sides of the stepladder are locked into place. Ask someone to hold it for you, if possible. Clearly mark and make sure that you can see: Any grab bars or handrails. First and last steps. Where the edge of each step is. Use tools that help you move around (mobility aids) if they are needed. These include: Canes. Walkers. Scooters. Crutches. Turn on the lights when you go into a dark area. Replace any light bulbs as soon as they burn out. Set up your furniture so you have a clear path. Avoid moving your furniture around. If any of your floors are uneven, fix them. If there are any pets around you, be aware of where they are. Review your medicines with your doctor. Some medicines can make you feel dizzy. This can increase your chance of falling. Ask your doctor what other things that you can do to help prevent falls. This information is not intended to replace advice given to you by your health care provider. Make sure you discuss any questions you have with your health care provider. Document Released: 03/09/2009 Document Revised: 10/19/2015 Document Reviewed: 06/17/2014 Elsevier Interactive Patient Education  2017 [employer].

## 2020-11-23 NOTE — Progress Notes (Addendum)
I connected with [name] today by telephone and verified that I am speaking with the correct person using two identifiers. Location patient: home Location provider: work Persons participating in the virtual visit: [name] and Chase Abu, LPN.   I discussed the limitations, risks, security and privacy concerns of performing an evaluation and management service by telephone and the availability of in person appointments. I also discussed with the patient that there may be a patient responsible charge related to this service. The patient expressed understanding and verbally consented to this telephonic visit.    Interactive audio and video telecommunications were attempted between this provider and patient, however failed, due to patient having technical difficulties OR patient did not have access to video capability.  We continued and completed visit with audio only.  Some vital signs may be absent or patient reported.   Time Spent with patient on telephone encounter: 30 minutes  Subjective:   [name]. is a 75 y.o. male who presents for Medicare Annual/Subsequent preventive examination.  Review of Systems     Cardiac Risk Factors include: advanced age (>49mn, >>56women);diabetes mellitus;dyslipidemia;family history of premature cardiovascular disease;hypertension;male gender     Objective:    There were no vitals filed for this visit. There is no height or weight on file to calculate BMI.  Advanced Directives 11/23/2020 06/12/2020 10/14/2019 08/21/2016 07/18/2011  Does Patient Have a Medical Advance Directive? Yes Yes Yes No Patient does not have advance directive  Type of Advance Directive Living will;Healthcare Power of Attorney Living will;[place]care [place] of [place] - -  Does patient want to make changes to medical advance directive? No - Patient declined No - Patient declined No - Patient declined - -  Copy of  [place] Chart? No - copy requested - No - copy requested - -  Would patient like information on creating a medical advance directive? - - - Yes (ED - Information included in AVS) -    Current Medications (verified) Outpatient Encounter Medications as of 11/23/2020  Medication Sig   acetaminophen (TYLENOL) 650 MG CR tablet Take 650 mg by mouth 2 (two) times daily as needed for pain.    albuterol (PROAIR HFA) 108 (90 Base) MCG/ACT inhaler Inhale 2 puffs into the lungs every 6 (six) hours as needed for wheezing (cough).   amLODipine (NORVASC) 10 MG tablet TAKE 1 TABLET (10 MG TOTAL) BY MOUTH DAILY BEFORE BREAKFAST.   aspirin EC 81 MG tablet Take 81 mg by mouth daily.   Blood Glucose Monitoring Suppl (ONETOUCH VERIO) w/Device KIT 1 Units by Does not apply route daily as needed.   carvedilol (COREG) 25 MG tablet TAKE 1 TABLET BY MOUTH TWICE A DAY WITH MEALS   Cholecalciferol (VITAMIN D3) 1000 UNITS tablet Take 2,000 Units by mouth daily.   Coenzyme Q10 (CO Q-10) 200 MG CAPS Take 400 mg by mouth daily.    Folic Acid 0.8 MG CAPS Take 2 capsules by mouth every morning.    glucose blood (ONETOUCH VERIO) test strip USE TO CHECK BLOOD SUGAR DAILY   glucose blood test strip USE TO TEST BLOOD SUGAR TWICE A DAY   hydrOXYzine (ATARAX/VISTARIL) 25 MG tablet TAKE 1-2 TABLETS AT BEDTIME AS NEED FOR INSOMNIA   KRILL OIL PO Take by mouth.   metFORMIN (GLUCOPHAGE-XR) 750 MG 24 hr tablet TAKE 1 TABLET BY MOUTH EVERY DAY WITH BREAKFAST   OneTouch Delica Lancets 372ZMISC USE TO CHECK BLOOD  SUGAR TWICE DAILY   pioglitazone (ACTOS) 45 MG tablet TAKE 1 TABLET (45 MG TOTAL) BY MOUTH EVERY OTHER DAY.   traMADol (ULTRAM) 50 MG tablet Take by mouth every 6 (six) hours as needed.   [DISCONTINUED] mometasone-formoterol (DULERA) 100-5 MCG/ACT AERO Inhale 2 puffs into the lungs every morning.   No facility-administered encounter medications on file as of 11/23/2020.    Allergies  (verified) Olmesartan, Pravastatin, and Symbicort [budesonide-formoterol fumarate]   History: Past Medical History:  Diagnosis Date   Asthma    mild, chronic cough   Chronic kidney disease    kidney stone and infection- started 03-27-11   COPD (chronic obstructive pulmonary disease) (HCC)    Diabetes mellitus    GERD (gastroesophageal reflux disease)    Hyperlipidemia    Hypertension    Past Surgical History:  Procedure Laterality Date   APPENDECTOMY  1963   CYSTOSCOPY W/ URETERAL STENT PLACEMENT  03/26/11   removal of kidney stone   MASS EXCISION Left 06/16/2020   Procedure: MINOR EXCISION OF MASS;  Surgeon: Izora Gala, MD;  Location: [place];  Service: ENT;  Laterality: Left;   Family History  Problem Relation Age of Onset   Heart disease Father 83       MI   Diabetes Father    Hypertension Father    Multiple sclerosis Mother    Diabetes Brother    Hypertension Other    Coronary artery disease Other    Heart disease Sister 25       CAD   Diabetes Sister    Parkinsonism Brother    Hypertension Brother    Colon cancer Neg Hx    Esophageal cancer Neg Hx    Stomach cancer Neg Hx    Social History   Socioeconomic History   Marital status: Married    Spouse name: Not on file   Number of children: 3   Years of education: Not on file   Highest education level: Not on file  Occupational History   Occupation: Professional  Tobacco Use   Smoking status: Former    Packs/day: 1.50    Years: 40.00    Pack years: 60.00    Types: Cigarettes    Quit date: 08/13/2001    Years since quitting: 19.2   Smokeless tobacco: Never   Tobacco comments:    Counseled to remain smoke free.  Substance and Sexual Activity   Alcohol use: Yes    Alcohol/week: 10.0 standard drinks    Types: 10 Glasses of wine per week   Drug use: No   Sexual activity: Yes  Other Topics Concern   Not on file  Social History Narrative   Regular Exercise -  YES; riding       Social Determinants of Health   Financial Resource Strain: Low Risk    Difficulty of Paying Living Expenses: Not hard at all  Food Insecurity: No Food Insecurity   Worried About Charity fundraiser in the Last Year: Never true   Ran Out of Food in the Last Year: Never true  Transportation Needs: No Transportation Needs   Lack of Transportation (Medical): No   Lack of Transportation (Non-Medical): No  Physical Activity: Sufficiently Active   Days of Exercise per Week: 5 days   Minutes of Exercise per Session: 30 min  Stress: No Stress Concern Present   Feeling of Stress : Not at all  Social Connections: Moderately Isolated   Frequency of Communication with Friends and  Family: More than three times a week   Frequency of Social Gatherings with Friends and Family: Once a week   Attends Religious Services: Never   Marine scientist or Organizations: No   Attends Archivist Meetings: Never   Marital Status: Married    Tobacco Counseling Counseling given: Not Answered Tobacco comments: Counseled to remain smoke free.   Clinical Intake:  Pre-visit preparation completed: Yes  Pain : No/denies pain     Nutritional Risks: None Diabetes: Yes CBG done?: No (per patient: fasting 135) Did pt. bring in CBG monitor from home?: No  How often do you need to have someone help you when you read instructions, pamphlets, or other written materials from your doctor or pharmacy?: 1 - Never What is the last grade level you completed in school?: Master's Degree  Diabetic? yes  Interpreter Needed?: No  Information entered by :: Chase Abu, LPN   Activities of Daily Living In your present state of health, do you have any difficulty performing the following activities: 11/23/2020  Hearing? N  Comment wears hearing aids  Vision? N  Difficulty concentrating or making decisions? N  Walking or climbing stairs? N  Dressing or bathing? N  Doing errands, shopping? N   Preparing Food and eating ? N  Using the Toilet? N  In the past six months, have you accidently leaked urine? N  Do you have problems with loss of bowel control? N  Managing your Medications? N  Managing your Finances? N  Housekeeping or managing your Housekeeping? N  Some recent data might be hidden    Patient Care Team: Plotnikov, Evie Lacks, MD as PCP - General Croitoru, Mihai, MD as Consulting Physician (Cardiology) [place], Heather, [place] as Consulting Physician (Optometry)  Indicate any recent Medical Services you may have received from other than [facility] providers in the past year (date may be approximate).     Assessment:   This is a routine wellness examination for [place].  Hearing/Vision screen Hearing Screening - Comments:: Patient wears hearing aids. Vision Screening - Comments:: Patient wears reading glasses. 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       Depression Screen PHQ 2/9 Scores 11/23/2020 06/21/2020 10/14/2019 06/16/2019 10/15/2017 08/21/2016 04/10/2016  PHQ - 2 Score 0 0 0 0 0 0 0    Fall Risk Fall Risk  11/23/2020 06/21/2020 10/14/2019 06/16/2019 10/15/2017  Falls in the past year? 0 0 0 0 No  Number falls in past yr: 0 0 0 - -  Injury with Fall? 0 0 0 - -  Risk for fall due to : No Fall Risks - No Fall Risks - -  Follow up Falls evaluation completed - Falls evaluation completed;Falls prevention discussed;Education provided Falls  evaluation completed -    FALL RISK PREVENTION PERTAINING TO THE HOME:  Any stairs in or around the home? Yes  If so, are there any without handrails? No  Home free  of loose throw rugs in walkways, pet beds, electrical cords, etc? Yes  Adequate lighting in your home to reduce risk of falls? Yes   ASSISTIVE DEVICES UTILIZED TO PREVENT FALLS:  Life alert? No  Use of a cane, walker or w/c? No  Grab bars in the bathroom? Yes  Shower chair or bench in shower? Yes  Elevated toilet seat or a handicapped toilet? Yes   TIMED UP AND GO:  Was the test performed? No .  Length of time to ambulate 10 feet: 0 sec.   Gait steady and fast without use of assistive device (per patient)  Cognitive Function: Normal cognitive status assessed by direct observation by this Nurse Health Advisor. No abnormalities found.          Immunizations Immunization History  Administered Date(s) Administered   Fluad Quad(high Dose 65+) 02/22/2020   Influenza Split 02/21/2012   Influenza Whole 02/25/2011   Influenza, High Dose Seasonal PF 03/10/2013, 04/10/2016, 02/19/2017, 03/16/2018, 01/21/2019   Influenza,inj,Quad PF,6+ Mos 03/28/2014, 04/04/2015   PFIZER(Purple Top)SARS-COV-2 Vaccination 07/02/2019, 07/27/2019, 04/18/2020   Pneumococcal Conjugate-13 07/23/2013   Pneumococcal Polysaccharide-23 11/13/2009   Td 11/13/2009   Tdap 10/14/2019   Zoster Recombinat (Shingrix) 06/07/2020   Zoster, Live 11/20/2009    TDAP status: Up to date  Flu Vaccine status: Up to date  Pneumococcal vaccine status: Up to date  Covid-19 vaccine status: Completed vaccines  Qualifies for Shingles Vaccine? Yes   Zostavax completed Yes   Shingrix Completed?: Yes  Screening Tests Health Maintenance  Topic Date Due   FOOT EXAM  06/15/2020   COVID-19 Vaccine (4 - Booster for Pfizer series) 07/19/2020   INFLUENZA VACCINE  12/25/2020   OPHTHALMOLOGY EXAM  01/23/2021   HEMOGLOBIN A1C  04/21/2021   URINE MICROALBUMIN   06/21/2021   TETANUS/TDAP  10/13/2029   Hepatitis C Screening  Completed   PNA vac Low Risk Adult  Completed   Zoster Vaccines- Shingrix  Completed   HPV VACCINES  Aged Out    Health Maintenance  Health Maintenance Due  Topic Date Due   FOOT EXAM  06/15/2020   COVID-19 Vaccine (4 - Booster for Sibley series) 07/19/2020    Colorectal cancer screening: No longer required.   Lung Cancer Screening: (Low Dose CT Chest recommended if Age 3-80 years, 30 pack-year currently smoking OR have quit w/in 15years.) does not qualify.   Lung Cancer Screening Referral: no  Additional Screening:  Hepatitis C Screening: does not qualify; Completed no  Vision Screening: Recommended annual ophthalmology exams for early detection of glaucoma and other disorders of the eye. Is the patient up to date with their annual eye exam?  Yes  Who is the provider or what is the name of the office in which the patient attends annual eye exams? Heather [place], OD. If pt is not established with a provider, would they like to be referred to a provider to establish care? No .   Dental Screening: Recommended annual dental exams for proper oral hygiene  Community Resource Referral / Chronic Care Management: CRR required this visit?  No   CCM required this visit?  No      Plan:     I have personally reviewed and noted the following in the patient's chart:   Medical and social history Use of alcohol, tobacco or illicit drugs  Current medications and supplements including opioid prescriptions. Patient is not currently taking opioid prescriptions. Functional ability and status Nutritional status Physical activity Advanced directives List of other  physicians Hospitalizations, surgeries, and ER visits in previous 12 months Vitals Screenings to include cognitive, depression, and falls Referrals and appointments  In addition, I have reviewed and discussed with patient certain preventive protocols, quality  metrics, and best practice recommendations. A written personalized care plan for preventive services as well as general preventive health recommendations were provided to patient.     Sheral Flow, LPN   1/[id number]/[id number]    Medical screening examination/treatment/procedure(s) were performed by non-physician practitioner and as supervising physician I was immediately available for consultation/collaboration.  I agree with above. Lew Dawes, MD

## 2020-12-20 ENCOUNTER — Other Ambulatory Visit: Payer: Self-pay | Admitting: Internal Medicine

## 2021-02-19 ENCOUNTER — Ambulatory Visit: Payer: PPO | Admitting: Internal Medicine

## 2021-02-21 ENCOUNTER — Ambulatory Visit (INDEPENDENT_AMBULATORY_CARE_PROVIDER_SITE_OTHER): Payer: PPO | Admitting: Internal Medicine

## 2021-02-21 ENCOUNTER — Encounter: Payer: Self-pay | Admitting: Internal Medicine

## 2021-02-21 ENCOUNTER — Other Ambulatory Visit: Payer: Self-pay

## 2021-02-21 VITALS — BP 130/82 | HR 67 | Temp 98.2°F | Ht 64.0 in | Wt 197.0 lb

## 2021-02-21 DIAGNOSIS — G8929 Other chronic pain: Secondary | ICD-10-CM

## 2021-02-21 DIAGNOSIS — Z23 Encounter for immunization: Secondary | ICD-10-CM

## 2021-02-21 DIAGNOSIS — E119 Type 2 diabetes mellitus without complications: Secondary | ICD-10-CM | POA: Diagnosis not present

## 2021-02-21 DIAGNOSIS — I10 Essential (primary) hypertension: Secondary | ICD-10-CM

## 2021-02-21 DIAGNOSIS — I2584 Coronary atherosclerosis due to calcified coronary lesion: Secondary | ICD-10-CM

## 2021-02-21 DIAGNOSIS — M5440 Lumbago with sciatica, unspecified side: Secondary | ICD-10-CM

## 2021-02-21 DIAGNOSIS — I2510 Atherosclerotic heart disease of native coronary artery without angina pectoris: Secondary | ICD-10-CM | POA: Diagnosis not present

## 2021-02-21 LAB — COMPREHENSIVE METABOLIC PANEL
ALT: 23 U/L (ref 0–53)
AST: 21 U/L (ref 0–37)
Albumin: 3.9 g/dL (ref 3.5–5.2)
Alkaline Phosphatase: 87 U/L (ref 39–117)
BUN: 14 mg/dL (ref 6–23)
CO2: 29 mEq/L (ref 19–32)
Calcium: 9.3 mg/dL (ref 8.4–10.5)
Chloride: 102 mEq/L (ref 96–112)
Creatinine, Ser: 0.84 mg/dL (ref 0.40–1.50)
GFR: 84.55 mL/min (ref >60.00)
Glucose, Bld: 139 mg/dL — ABNORMAL HIGH (ref 70–99)
Potassium: 4.2 mEq/L (ref 3.5–5.1)
Sodium: 138 mEq/L (ref 135–145)
Total Bilirubin: 0.5 mg/dL (ref 0.2–1.2)
Total Protein: 6.8 g/dL (ref 6.0–8.3)

## 2021-02-21 LAB — HEMOGLOBIN A1C: Hgb A1c MFr Bld: 6.9 % — ABNORMAL HIGH (ref 4.6–6.5)

## 2021-02-21 NOTE — Assessment & Plan Note (Signed)
MSK Aleve - rare Tylenol prn

## 2021-02-21 NOTE — Assessment & Plan Note (Signed)
On Pravachol No angina

## 2021-02-21 NOTE — Addendum Note (Signed)
Addended by: Boris Lown B on: 02/21/2021 10:03 AM   Modules accepted: Orders

## 2021-02-21 NOTE — Assessment & Plan Note (Signed)
Continue on Metfomin XR, Actos Cont w/wt loss Check A1c

## 2021-02-21 NOTE — Assessment & Plan Note (Signed)
BP Readings from Last 3 Encounters:  02/21/21 130/82  06/21/20 130/72  06/16/20 136/68   On Coreg, Amlodipine

## 2021-02-21 NOTE — Progress Notes (Signed)
Subjective:  Patient ID: Chase Mage., male    DOB: [birth year]  Age: 75 y.o. MRN: [id number]  CC: Follow-up (4 month f/u- Flu shot)   HPI [name]. presents for DM, HTN, CAD C/o LBP at times  Outpatient Medications Prior to Visit  Medication Sig Dispense Refill   acetaminophen (TYLENOL) 650 MG CR tablet Take 650 mg by mouth 2 (two) times daily as needed for pain.      amLODipine (NORVASC) 10 MG tablet TAKE 1 TABLET (10 MG TOTAL) BY MOUTH DAILY BEFORE BREAKFAST. 90 tablet 3   aspirin EC 81 MG tablet Take 81 mg by mouth daily.     Blood Glucose Monitoring Suppl (ONETOUCH VERIO) w/Device KIT 1 Units by Does not apply route daily as needed. 1 kit 1   carvedilol (COREG) 25 MG tablet TAKE 1 TABLET BY MOUTH TWICE A DAY WITH MEALS 180 tablet 3   Cholecalciferol (VITAMIN D3) 1000 UNITS tablet Take 2,000 Units by mouth daily.     Coenzyme Q10 (CO Q-10) 200 MG CAPS Take 400 mg by mouth daily.      Folic Acid 0.8 MG CAPS Take 2 capsules by mouth every morning.  30 each 0   glucose blood (ONETOUCH VERIO) test strip USE TO CHECK BLOOD SUGAR DAILY 100 strip 5   glucose blood test strip USE TO TEST BLOOD SUGAR TWICE A DAY 100 each 3   hydrOXYzine (ATARAX/VISTARIL) 25 MG tablet TAKE 1-2 TABLETS AT BEDTIME AS NEED FOR INSOMNIA 180 tablet 1   KRILL OIL PO Take by mouth.     metFORMIN (GLUCOPHAGE-XR) 750 MG 24 hr tablet TAKE 1 TABLET BY MOUTH EVERY DAY WITH BREAKFAST 90 tablet 2   OneTouch Delica Lancets 78E MISC USE TO CHECK BLOOD SUGAR TWICE DAILY 100 each 3   pioglitazone (ACTOS) 45 MG tablet TAKE 1 TABLET (45 MG TOTAL) BY MOUTH EVERY OTHER DAY. 45 tablet 3   traMADol (ULTRAM) 50 MG tablet Take by mouth every 6 (six) hours as needed.     albuterol (PROAIR HFA) 108 (90 Base) MCG/ACT inhaler Inhale 2 puffs into the lungs every 6 (six) hours as needed for wheezing (cough). 18 g 3   No facility-administered medications prior to visit.    ROS: Review of Systems  Constitutional:   Negative for appetite change, fatigue and unexpected weight change.  HENT:  Negative for congestion, nosebleeds, sneezing, sore throat and trouble swallowing.   Eyes:  Negative for itching and visual disturbance.  Respiratory:  Negative for cough.   Cardiovascular:  Negative for chest pain, palpitations and leg swelling.  Gastrointestinal:  Negative for abdominal distention, blood in stool, diarrhea and nausea.  Genitourinary:  Negative for frequency and hematuria.  Musculoskeletal:  Positive for back pain. Negative for gait problem, joint swelling and neck pain.  Skin:  Negative for rash.  Neurological:  Negative for dizziness, tremors, speech difficulty and weakness.  Psychiatric/Behavioral:  Negative for agitation, dysphoric mood, sleep disturbance and suicidal ideas. The patient is not nervous/anxious.    Objective:  BP 130/82 (BP Location: Left Arm)   Pulse 67   Temp 98.2 F (36.8 C) (Oral)   Ht '5\' 4"'  (1.626 m)   Wt 197 lb (89.4 kg)   SpO2 96%   BMI 33.81 kg/m   BP Readings from Last 3 Encounters:  02/21/21 130/82  06/21/20 130/72  06/16/20 136/68    Wt Readings from Last 3 Encounters:  02/21/21 197 lb (89.4 kg)  06/21/20  194 lb 9.6 oz (88.3 kg)  06/16/20 192 lb 3.9 oz (87.2 kg)    Physical Exam Constitutional:      General: He is not in acute distress.    Appearance: He is well-developed. He is obese.     Comments: NAD  Eyes:     Conjunctiva/sclera: Conjunctivae normal.     Pupils: Pupils are equal, round, and reactive to light.  Neck:     Thyroid: No thyromegaly.     Vascular: No JVD.  Cardiovascular:     Rate and Rhythm: Normal rate and regular rhythm.     Heart sounds: Normal heart sounds. No murmur heard.   No friction rub. No gallop.  Pulmonary:     Effort: Pulmonary effort is normal. No respiratory distress.     Breath sounds: Normal breath sounds. No wheezing or rales.  Chest:     Chest wall: No tenderness.  Abdominal:     General: Bowel sounds are  normal. There is no distension.     Palpations: Abdomen is soft. There is no mass.     Tenderness: There is no abdominal tenderness. There is no guarding or rebound.  Musculoskeletal:        General: Tenderness present. Normal range of motion.     Cervical back: Normal range of motion.  Lymphadenopathy:     Cervical: No cervical adenopathy.  Skin:    General: Skin is warm and dry.     Findings: No rash.  Neurological:     Mental Status: He is alert and oriented to person, place, and time.     Cranial Nerves: No cranial nerve deficit.     Motor: No abnormal muscle tone.     Coordination: Coordination normal.     Gait: Gait normal.     Deep Tendon Reflexes: Reflexes are normal and symmetric.  Psychiatric:        Behavior: Behavior normal.        Thought Content: Thought content normal.        Judgment: Judgment normal.    Lab Results  Component Value Date   WBC 5.9 10/19/2020   HGB 16.4 10/19/2020   HCT 48.2 10/19/2020   PLT 226.0 10/19/2020   GLUCOSE 130 (H) 10/19/2020   CHOL 216 (H) 10/19/2020   TRIG 94.0 10/19/2020   HDL 81.50 10/19/2020   LDLCALC 115 (H) 10/19/2020   ALT 17 10/19/2020   AST 18 10/19/2020   NA 138 10/19/2020   K 4.5 10/19/2020   CL 103 10/19/2020   CREATININE 0.84 10/19/2020   BUN 12 10/19/2020   CO2 25 10/19/2020   TSH 3.09 10/19/2020   PSA 1.13 10/19/2020   HGBA1C 7.0 (H) 10/19/2020   MICROALBUR 1.9 06/21/2020    No results found.  Assessment & Plan:   Problem List Items Addressed This Visit     Coronary artery calcification    On Pravachol No angina      Diabetes type 2, controlled ([place])     Continue on Metfomin XR, Actos Cont w/wt loss Check A1c      HTN (hypertension)    BP Readings from Last 3 Encounters:  02/21/21 130/82  06/21/20 130/72  06/16/20 136/68  On Coreg, Amlodipine      Other Visit Diagnoses     Needs flu shot    -  Primary   Relevant Orders   Flu Vaccine QUAD High Dose(Fluad) (Completed)          Follow-up: No follow-ups on  file.  [name], MD

## 2021-02-27 DIAGNOSIS — H2513 Age-related nuclear cataract, bilateral: Secondary | ICD-10-CM | POA: Diagnosis not present

## 2021-02-27 DIAGNOSIS — H353131 Nonexudative age-related macular degeneration, bilateral, early dry stage: Secondary | ICD-10-CM | POA: Diagnosis not present

## 2021-02-27 DIAGNOSIS — E119 Type 2 diabetes mellitus without complications: Secondary | ICD-10-CM | POA: Diagnosis not present

## 2021-02-27 DIAGNOSIS — H18413 Arcus senilis, bilateral: Secondary | ICD-10-CM | POA: Diagnosis not present

## 2021-02-27 DIAGNOSIS — D3132 Benign neoplasm of left choroid: Secondary | ICD-10-CM | POA: Diagnosis not present

## 2021-02-27 DIAGNOSIS — H35363 Drusen (degenerative) of macula, bilateral: Secondary | ICD-10-CM | POA: Diagnosis not present

## 2021-02-27 LAB — HM DIABETES EYE EXAM

## 2021-03-05 ENCOUNTER — Other Ambulatory Visit: Payer: Self-pay | Admitting: Internal Medicine

## 2021-03-06 ENCOUNTER — Other Ambulatory Visit: Payer: Self-pay | Admitting: Internal Medicine

## 2021-03-20 ENCOUNTER — Encounter: Payer: Self-pay | Admitting: Family Medicine

## 2021-03-20 ENCOUNTER — Telehealth (INDEPENDENT_AMBULATORY_CARE_PROVIDER_SITE_OTHER): Payer: PPO | Admitting: Family Medicine

## 2021-03-20 DIAGNOSIS — U071 COVID-19: Secondary | ICD-10-CM

## 2021-03-20 MED ORDER — BENZONATATE 100 MG PO CAPS: ORAL_CAPSULE | ORAL | 0 refills | Status: DC

## 2021-03-20 MED ORDER — NIRMATRELVIR/RITONAVIR (PAXLOVID)TABLET: 3.0000 | ORAL_TABLET | Freq: Two times a day (BID) | ORAL | 0 refills | Status: AC

## 2021-03-20 NOTE — Patient Instructions (Addendum)
HOME CARE TIPS:  -I sent the medication(s) we discussed to your pharmacy: Meds ordered this encounter  Medications   nirmatrelvir/ritonavir EUA (PAXLOVID) 20 x 150 MG & 10 x 100MG TABS    Sig: Take 3 tablets by mouth 2 (two) times daily for 5 days. (Take nirmatrelvir 150 mg two tablets twice daily for 5 days and ritonavir 100 mg one tablet twice daily for 5 days) Patient GFR is > 60    Dispense:  30 tablet    Refill:  0   benzonatate (TESSALON PERLES) 100 MG capsule    Sig: 1-2 capsules twice daily as needed for cough    Dispense:  30 capsule    Refill:  0     -I sent in the [place] treatment or referral you requested per our discussion. Please see the information provided below and discuss further with the pharmacist/treatment team.  -If taking Paxlovid, please review all medications, supplement and over the counter drugs with your pharmacist and ask them to check for any interactions. Please make the following changes to your regular medications while taking Paxlovid: *take one half dose of your Norvasc (amlodipine) while taking Paxlovid *do not take the Hydroxyzine or the Tramadol while taking Paxlovid *Do not take your supplements while taking the Paxlovid  -there is a chance of rebound illness after finishing your treatment. If you become sick again please isolate for an additional 5 days, plus 5 more days of masking.   -can use tylenol if needed for fevers, aches and pains per instructions  -can use nasal saline a few times per day if you have nasal congestion  -stay hydrated, drink plenty of fluids and eat small healthy meals - avoid dairy  -follow up with your doctor in 2-3 days unless improving and feeling better  -stay home while sick, except to seek medical care. If you have COVID19, ideally it would be best to stay home for a full 10 days since the onset of symptoms PLUS one day of no fever and feeling better. Wear a good mask that fits snugly (such as N95 or KN95) if  around others to reduce the risk of transmission.  It was nice to meet you today, and I really hope you are feeling better soon. I help [facility] out with telemedicine visits on Tuesdays and Thursdays and am available for visits on those days. If you have any concerns or questions following this visit please schedule a follow up visit with your Primary Care doctor or seek care at a local urgent care clinic to avoid delays in care.    Seek in person care or schedule a follow up video visit promptly if your symptoms worsen, new concerns arise or you are not improving with treatment. Call 911 and/or seek emergency care if your symptoms are severe or life threatening.  FACT SHEET FOR PATIENTS, PARENTS, AND CAREGIVERS EMERGENCY USE AUTHORIZATION (EUA) OF PAXLOVID FOR CORONAVIRUS DISEASE 2019 (COVID-19) You are being given this Fact Sheet because your healthcare provider believes it is necessary to provide you with PAXLOVID for the treatment of mild-to-moderate coronavirus disease (COVID-19) caused by the SARS-CoV-2 virus. This Fact Sheet contains information to help you understand the risks and benefits of taking the PAXLOVID you have received or may receive. The U.S. Food and Drug Administration (FDA) has issued an Emergency Use Authorization (EUA) to make PAXLOVID available during the COVID-19 pandemic (for more details about an EUA please see "What is an Emergency Use Authorization?" at the end of  this document). PAXLOVID is not an FDA-approved medicine in the [place]. Read this Fact Sheet for information about PAXLOVID. Talk to your healthcare provider about your options or if you have any questions. It is your choice to take PAXLOVID.  What is COVID-19? COVID-19 is caused by a virus called a coronavirus. You can get COVID-19 through close contact with another person who has the virus. COVID-19 illnesses have ranged from very mild-to-severe, including illness resulting in death.  While information so far suggests that most COVID-19 illness is mild, serious illness can happen and may cause some of your other medical conditions to become worse. Older people and people of all ages with severe, long lasting (chronic) medical conditions like heart disease, lung disease, and diabetes, for example seem to be at higher risk of being hospitalized for COVID-19.  What is PAXLOVID? PAXLOVID is an investigational medicine used to treat mild-to-moderate COVID-19 in adults and children [46 years of age and older weighing at least 33 pounds (32 kg)] with positive results of direct SARS-CoV-2 viral testing, and who are at high risk for progression to severe COVID-19, including hospitalization or death. PAXLOVID is investigational because it is still being studied. There is limited information about the safety and effectiveness of using PAXLOVID to treat people with mild-to-moderate COVID-19.  The FDA has authorized the emergency use of PAXLOVID for the treatment of mild-tomoderate COVID-19 in adults and children [32 years of age and older weighing at least 47 pounds (68 kg)] with a positive test for the virus that causes COVID-19, and who are at high risk for progression to severe COVID-19, including hospitalization or death, under an EUA. 1 Revised: 11 August 2020   What should I tell my healthcare provider before I take PAXLOVID? Tell your healthcare provider if you: ? Have any allergies ? Have liver or kidney disease ? Are pregnant or plan to become pregnant ? Are breastfeeding a child ? Have any serious illnesses  Tell your healthcare provider about all the medicines you take, including prescription and over-the-counter medicines, vitamins, and herbal supplements. Some medicines may interact with PAXLOVID and may cause serious side effects. Keep a list of your medicines to show your healthcare provider and pharmacist when you get a new medicine.  You can ask your  healthcare provider or pharmacist for a list of medicines that interact with PAXLOVID. Do not start taking a new medicine without telling your healthcare provider. Your healthcare provider can tell you if it is safe to take PAXLOVID with other medicines.  Tell your healthcare provider if you are taking combined hormonal contraceptive. PAXLOVID may affect how your birth control pills work. Females who are able to become pregnant should use another effective alternative form of contraception or an additional barrier method of contraception. Talk to your healthcare provider if you have any questions about contraceptive methods that might be right for you.  How do I take PAXLOVID? ? PAXLOVID consists of 2 medicines: nirmatrelvir and ritonavir. o Take 2 pink tablets of nirmatrelvir with 1 white tablet of ritonavir by mouth 2 times each day (in the morning and in the evening) for 5 days. For each dose, take all 3 tablets at the same time. o If you have kidney disease, talk to your healthcare provider. You may need a different dose. ? Swallow the tablets whole. Do not chew, break, or crush the tablets. ? Take PAXLOVID with or without food. ? Do not stop taking PAXLOVID without talking to your healthcare provider,  even if you feel better. ? If you miss a dose of PAXLOVID within 8 hours of the time it is usually taken, take it as soon as you remember. If you miss a dose by more than 8 hours, skip the missed dose and take the next dose at your regular time. Do not take 2 doses of PAXLOVID at the same time. ? If you take too much PAXLOVID, call your healthcare provider or go to the nearest hospital emergency room right away. ? If you are taking a ritonavir- or cobicistat-containing medicine to treat hepatitis C or Human Immunodeficiency Virus (HIV), you should continue to take your medicine as prescribed by your healthcare provider. 2 Revised: 11 August 2020    Talk to your healthcare provider  if you do not feel better or if you feel worse after 5 days.  Who should generally not take PAXLOVID? Do not take PAXLOVID if: ? You are allergic to nirmatrelvir, ritonavir, or any of the ingredients in PAXLOVID. ? You are taking any of the following medicines: o Alfuzosin o Pethidine, propoxyphene o Ranolazine o Amiodarone, dronedarone, flecainide, propafenone, quinidine o Colchicine o Lurasidone, pimozide, clozapine o Dihydroergotamine, ergotamine, methylergonovine o Lovastatin, simvastatin o Sildenafil (Revatio) for pulmonary arterial hypertension (PAH) o Triazolam, oral midazolam o Apalutamide o Carbamazepine, phenobarbital, phenytoin o Rifampin o St. John's Wort (hypericum perforatum) Taking PAXLOVID with these medicines may cause serious or life-threatening side effects or affect how PAXLOVID works.  These are not the only medicines that may cause serious side effects if taken with PAXLOVID. PAXLOVID may increase or decrease the levels of multiple other medicines. It is very important to tell your healthcare provider about all of the medicines you are taking because additional laboratory tests or changes in the dose of your other medicines may be necessary while you are taking PAXLOVID. Your healthcare provider may also tell you about specific symptoms to watch out for that may indicate that you need to stop or decrease the dose of some of your other medicines.  What are the important possible side effects of PAXLOVID? Possible side effects of PAXLOVID are: ? Allergic Reactions. Allergic reactions can happen in people taking PAXLOVID, even after only 1 dose. Stop taking PAXLOVID and call your healthcare provider right away if you get any of the following symptoms of an allergic reaction: o hives o trouble swallowing or breathing o swelling of the mouth, lips, or face o throat tightness o hoarseness 3 Revised: 11 August 2020  o skin rash ? Liver Problems. Tell your  healthcare provider right away if you have any of these signs and symptoms of liver problems: loss of appetite, yellowing of your skin and the whites of eyes (jaundice), dark-colored urine, pale colored stools and itchy skin, stomach area (abdominal) pain. ? Resistance to HIV Medicines. If you have untreated HIV infection, PAXLOVID may lead to some HIV medicines not working as well in the future. ? Other possible side effects include: o altered sense of taste o diarrhea o high blood pressure o muscle aches These are not all the possible side effects of PAXLOVID. Not many people have taken PAXLOVID. Serious and unexpected side effects may happen. PAXLOVID is still being studied, so it is possible that all of the risks are not known at this time.  What other treatment choices are there? Veklury (remdesivir) is FDA-approved for the treatment of mild-to-moderate NIOEV-03 in certain adults and children. Talk with your doctor to see if Marijean Heath is appropriate for you. Like  PAXLOVID, FDA may also allow for the emergency use of other medicines to treat people with COVID-19. Go to https://price.info/ for information on the emergency use of other medicines that are authorized by FDA to treat people with COVID-19. Your healthcare provider may talk with you about clinical trials for which you may be eligible. It is your choice to be treated or not to be treated with PAXLOVID. Should you decide not to receive it or for your child not to receive it, it will not change your standard medical care.  What if I am pregnant or breastfeeding? There is no experience treating pregnant women or breastfeeding mothers with PAXLOVID. For a mother and unborn baby, the benefit of taking PAXLOVID may be greater than the risk from the treatment. If you are pregnant, discuss your options and specific situation  with your healthcare provider. It is recommended that you use effective barrier contraception or do not have sexual activity while taking PAXLOVID. If you are breastfeeding, discuss your options and specific situation with your healthcare provider. 4 Revised: 11 August 2020   How do I report side effects with PAXLOVID? Contact your healthcare provider if you have any side effects that bother you or do not go away. Report side effects to FDA MedWatch at SmoothHits.hu or call 1-800-FDA1088 or you can report side effects to [employer]. at the contact information provided below. Website Fax number Telephone number www.pfizersafetyreporting.com [phone] How should I store [place]? Store PAXLOVID tablets at room temperature, between 68?F to 77?F (20?C to 25?C). How can I learn more about COVID-19? ? Ask your healthcare provider. ? Visit https://jacobson-johnson.com/. ? Contact your local or state public health department. What is an Emergency Use Authorization (EUA)? The [place] FDA has made PAXLOVID available under an emergency access mechanism called an Emergency Use Authorization (EUA). The EUA is supported by a Education officer, museum and Human Service (HHS) declaration that circumstances exist to justify the emergency use of drugs and biological products during the COVID-19 pandemic. PAXLOVID for the treatment of mild-to-moderate COVID-19 in adults and children [53 years of age and older weighing at least 39 pounds (13 kg)] with positive results of direct SARS-CoV-2 viral testing, and who are at high risk for progression to severe COVID-19, including hospitalization or death, has not undergone the same type of review as an FDA-approved product. In issuing an EUA under the MKLKJ-17 public health emergency, the FDA has determined, among other things, that based on the total amount of scientific evidence available including data from adequate  and well-controlled clinical trials, if available, it is reasonable to believe that the product may be effective for diagnosing, treating, or preventing COVID-19, or a serious or life-threatening disease or condition caused by COVID-19; that the known and potential benefits of the product, when used to diagnose, treat, or prevent such disease or condition, outweigh the known and potential risks of such product; and that there are no adequate, approved, and available alternatives. All of these criteria must be met to allow for the product to be used in the treatment of patients during the COVID-19 pandemic. The EUA for PAXLOVID is in effect for the duration of the COVID-19 declaration justifying emergency use of this product, unless terminated or revoked (after which the products may no longer be used under the EUA). 5 Revised: 11 August 2020     Additional Information For general questions, visit the website or call the telephone number provided below. Website Telephone number www.COVID19oralRx.com ([phone] (1-877-C19-PACK) You  can also go to www.pfizermedinfo.com or call [phone] for more information. [id number] Revised: 11 August 2020

## 2021-03-20 NOTE — Progress Notes (Signed)
Virtual Visit via Video Note  I connected with [name]  on 03/20/21 at 11:40 AM EDT by a video enabled telemedicine application and verified that I am speaking with the correct person using two identifiers.  Location patient: home, [place] Location provider:work or home office Persons participating in the virtual visit: patient, provider, patient's wife  I discussed the limitations of evaluation and management by telemedicine and the availability of in person appointments. The patient expressed understanding and agreed to proceed.   HPI:  Acute telemedicine visit for Covid19: -Onset: 2 days ago; positive covid test today -Symptoms include: nasal sinus congestion, cough, sore throat, nausea, body aches, he has used his alb once daily for the cough - does not feel wheezy -Denies: fever, CP, SOB, vomiting, diarrhea, inability to eat/drink/get up and down from bed -Has tried: musinex - not helping -Pertinent past medical history: see below -Pertinent medication allergies:  Allergies  Allergen Reactions   Olmesartan     hives   Pravastatin     aches   Symbicort [Budesonide-Formoterol Fumarate]     cough  -COVID-19 vaccine status: 2 doses + 1 booster; had flu shot this year as well Immunization History  Administered Date(s) Administered   Fluad Quad(high Dose 65+) 02/22/2020, 02/21/2021   Influenza Split 02/21/2012   Influenza Whole 02/25/2011   Influenza, High Dose Seasonal PF 03/10/2013, 04/10/2016, 02/19/2017, 03/16/2018, 01/21/2019   Influenza,inj,Quad PF,6+ Mos 03/28/2014, 04/04/2015   PFIZER(Purple Top)SARS-COV-2 Vaccination 07/02/2019, 07/27/2019, 04/18/2020   Pneumococcal Conjugate-13 07/23/2013   Pneumococcal Polysaccharide-23 11/13/2009   Td 11/13/2009   Tdap 10/14/2019   Zoster Recombinat (Shingrix) 06/07/2020, 08/16/2020   Zoster, Live 11/20/2009  GFR in September was 72  ROS: See pertinent positives and negatives per HPI.  Past Medical History:  Diagnosis Date   Asthma     mild, chronic cough   Chronic kidney disease    kidney stone and infection- started 03-27-11   COPD (chronic obstructive pulmonary disease) (HCC)    Diabetes mellitus    GERD (gastroesophageal reflux disease)    Hyperlipidemia    Hypertension     Past Surgical History:  Procedure Laterality Date   APPENDECTOMY  1963   CYSTOSCOPY W/ URETERAL STENT PLACEMENT  03/26/11   removal of kidney stone   MASS EXCISION Left 06/16/2020   Procedure: MINOR EXCISION OF MASS;  Surgeon: Izora Gala, MD;  Location: [facility];  Service: ENT;  Laterality: Left;     Current Outpatient Medications:    acetaminophen (TYLENOL) 650 MG CR tablet, Take 650 mg by mouth 2 (two) times daily as needed for pain. , Disp: , Rfl:    albuterol (VENTOLIN HFA) 108 (90 Base) MCG/ACT inhaler, INHALE 2 PUFFS INTO THE LUNGS EVERY 6 (SIX) HOURS AS NEEDED FOR WHEEZING (COUGH)., Disp: 8.5 each, Rfl: 3   amLODipine (NORVASC) 10 MG tablet, TAKE 1 TABLET (10 MG TOTAL) BY MOUTH DAILY BEFORE BREAKFAST., Disp: 90 tablet, Rfl: 3   aspirin EC 81 MG tablet, Take 81 mg by mouth daily., Disp: , Rfl:    benzonatate (TESSALON PERLES) 100 MG capsule, 1-2 capsules twice daily as needed for cough, Disp: 30 capsule, Rfl: 0   Blood Glucose Monitoring Suppl (ONETOUCH VERIO) w/Device KIT, 1 Units by Does not apply route daily as needed., Disp: 1 kit, Rfl: 1   carvedilol (COREG) 25 MG tablet, TAKE 1 TABLET BY MOUTH TWICE A DAY WITH MEALS, Disp: 180 tablet, Rfl: 3   Cholecalciferol (VITAMIN D3) 1000 UNITS tablet, Take 2,000 Units  by mouth daily., Disp: , Rfl:    Coenzyme Q10 (CO Q-10) 200 MG CAPS, Take 400 mg by mouth daily. , Disp: , Rfl:    Folic Acid 0.8 MG CAPS, Take 2 capsules by mouth every morning. , Disp: 30 each, Rfl: 0   glucose blood test strip, USE TO TEST BLOOD SUGAR TWICE A DAY, Disp: 100 each, Rfl: 3   hydrOXYzine (ATARAX/VISTARIL) 25 MG tablet, TAKE 1-2 TABLETS AT BEDTIME AS NEED FOR INSOMNIA, Disp: 180 tablet, Rfl:  1   KRILL OIL PO, Take by mouth., Disp: , Rfl:    metFORMIN (GLUCOPHAGE-XR) 750 MG 24 hr tablet, TAKE 1 TABLET BY MOUTH EVERY DAY WITH BREAKFAST, Disp: 90 tablet, Rfl: 2   nirmatrelvir/ritonavir EUA (PAXLOVID) 20 x 150 MG & 10 x $Re'100MG'qBx$  TABS, Take 3 tablets by mouth 2 (two) times daily for 5 days. (Take nirmatrelvir 150 mg two tablets twice daily for 5 days and ritonavir 100 mg one tablet twice daily for 5 days) Patient GFR is > 60, Disp: 30 tablet, Rfl: 0   OneTouch Delica Lancets 24P MISC, USE TO CHECK BLOOD SUGAR TWICE DAILY, Disp: 100 each, Rfl: 3   ONETOUCH VERIO test strip, USE TO CHECK BLOOD SUGAR DAILY, Disp: 100 strip, Rfl: 5   pioglitazone (ACTOS) 45 MG tablet, TAKE 1 TABLET (45 MG TOTAL) BY MOUTH EVERY OTHER DAY. (Patient taking differently: Take 22.5 mg by mouth every other day.), Disp: 45 tablet, Rfl: 3   traMADol (ULTRAM) 50 MG tablet, Take by mouth every 6 (six) hours as needed., Disp: , Rfl:  Reports does not take the tramadol or the hydroxyzine (can hold) and can hold his supplements. Takes only 1/2 tab of actos every other day. Can hold his supplements.  EXAM:  VITALS per patient if applicable:  GENERAL: alert, oriented, appears well and in no acute distress  HEENT: atraumatic, conjunttiva clear, no obvious abnormalities on inspection of external nose and ears  NECK: normal movements of the head and neck  LUNGS: on inspection no signs of respiratory distress, breathing rate appears normal, no obvious gross SOB, gasping or wheezing  CV: no obvious cyanosis  MS: moves all visible extremities without noticeable abnormality  PSYCH/NEURO: pleasant and cooperative, no obvious depression or anxiety, speech and thought processing grossly intact  ASSESSMENT AND PLAN:  Discussed the following assessment and plan:  COVID-19   Discussed treatment options (infusions and oral options and risk of drug interactions), ideal treatment window, potential complications, isolation and  precautions for COVID-19.  Discussed possibility of rebound with antivirals and the need to reisolate if it should occur for 5 days. Checked for/reviewed any labs done in the last 90 days with GFR listed in HPI if available. After lengthy discussion, the patient opted for treatment with Paxlovid due to being higher risk for complications of covid or severe disease and other factors. Discussed EUA status of this drug and the fact that there is preliminary limited knowledge of risks/interactions/side effects per EUA document vs possible benefits and precautions. This information was shared with patient during the visit and also was provided in patient instructions. Also, advised that patient discuss risks/interactions and use with pharmacist/treatment team as well. The patient did want a prescription for cough, Tessalon Rx sent.  Other symptomatic care measures summarized in patient instructions. Advised to seek prompt in person care if worsening, new symptoms arise, or if is not improving with treatment. Discussed options for inperson care if PCP office not available. Did let this patient  know that I only do telemedicine on Tuesdays and Thursdays for [place]. Advised to schedule follow up visit with PCP or [facility] if any further questions or concerns to avoid delays in care.   I discussed the assessment and treatment plan with the patient. The patient was provided an opportunity to ask questions and all were answered. The patient agreed with the plan and demonstrated an understanding of the instructions.     Lucretia Kern, DO

## 2021-04-05 ENCOUNTER — Other Ambulatory Visit: Payer: Self-pay | Admitting: Internal Medicine

## 2021-04-05 MED ORDER — ALPRAZOLAM 0.5 MG PO TABS: 0.5000 mg | ORAL_TABLET | Freq: Every evening | ORAL | 2 refills | Status: DC | PRN

## 2021-04-17 ENCOUNTER — Encounter: Payer: Self-pay | Admitting: Internal Medicine

## 2021-04-18 ENCOUNTER — Other Ambulatory Visit: Payer: Self-pay | Admitting: Internal Medicine

## 2021-04-18 MED ORDER — ESOMEPRAZOLE MAGNESIUM 40 MG PO CPDR: 40.0000 mg | DELAYED_RELEASE_CAPSULE | Freq: Every day | ORAL | 3 refills | Status: DC

## 2021-04-27 ENCOUNTER — Encounter: Payer: Self-pay | Admitting: Internal Medicine

## 2021-04-29 ENCOUNTER — Other Ambulatory Visit: Payer: Self-pay | Admitting: Internal Medicine

## 2021-04-29 MED ORDER — TRAMADOL HCL 50 MG PO TABS: 25.0000 mg | ORAL_TABLET | Freq: Four times a day (QID) | ORAL | 1 refills | Status: DC | PRN

## 2021-05-02 ENCOUNTER — Other Ambulatory Visit: Payer: Self-pay | Admitting: Internal Medicine

## 2021-05-02 MED ORDER — TRELEGY ELLIPTA 100-62.5-25 MCG/ACT IN AEPB: 1.0000 | INHALATION_SPRAY | Freq: Every day | RESPIRATORY_TRACT | 11 refills | Status: DC

## 2021-06-10 ENCOUNTER — Other Ambulatory Visit: Payer: Self-pay | Admitting: Family Medicine

## 2021-06-12 DIAGNOSIS — Z20822 Contact with and (suspected) exposure to covid-19: Secondary | ICD-10-CM | POA: Diagnosis not present

## 2021-06-12 DIAGNOSIS — Z03818 Encounter for observation for suspected exposure to other biological agents ruled out: Secondary | ICD-10-CM | POA: Diagnosis not present

## 2021-06-12 DIAGNOSIS — J209 Acute bronchitis, unspecified: Secondary | ICD-10-CM | POA: Diagnosis not present

## 2021-06-12 DIAGNOSIS — R058 Other specified cough: Secondary | ICD-10-CM | POA: Diagnosis not present

## 2021-06-27 ENCOUNTER — Other Ambulatory Visit: Payer: Self-pay

## 2021-06-27 ENCOUNTER — Ambulatory Visit (INDEPENDENT_AMBULATORY_CARE_PROVIDER_SITE_OTHER): Payer: PPO | Admitting: Internal Medicine

## 2021-06-27 ENCOUNTER — Encounter: Payer: Self-pay | Admitting: Internal Medicine

## 2021-06-27 DIAGNOSIS — F5101 Primary insomnia: Secondary | ICD-10-CM | POA: Diagnosis not present

## 2021-06-27 DIAGNOSIS — J45909 Unspecified asthma, uncomplicated: Secondary | ICD-10-CM

## 2021-06-27 DIAGNOSIS — E119 Type 2 diabetes mellitus without complications: Secondary | ICD-10-CM

## 2021-06-27 DIAGNOSIS — I10 Essential (primary) hypertension: Secondary | ICD-10-CM

## 2021-06-27 LAB — COMPREHENSIVE METABOLIC PANEL
ALT: 19 U/L (ref 0–53)
AST: 23 U/L (ref 0–37)
Albumin: 3.9 g/dL (ref 3.5–5.2)
Alkaline Phosphatase: 88 U/L (ref 39–117)
BUN: 13 mg/dL (ref 6–23)
CO2: 31 mEq/L (ref 19–32)
Calcium: 9.2 mg/dL (ref 8.4–10.5)
Chloride: 101 mEq/L (ref 96–112)
Creatinine, Ser: 0.86 mg/dL (ref 0.40–1.50)
GFR: 83.75 mL/min (ref >60.00)
Glucose, Bld: 130 mg/dL — ABNORMAL HIGH (ref 70–99)
Potassium: 4.5 mEq/L (ref 3.5–5.1)
Sodium: 138 mEq/L (ref 135–145)
Total Bilirubin: 0.6 mg/dL (ref 0.2–1.2)
Total Protein: 7 g/dL (ref 6.0–8.3)

## 2021-06-27 LAB — HEMOGLOBIN A1C: Hgb A1c MFr Bld: 7.1 % — ABNORMAL HIGH (ref 4.6–6.5)

## 2021-06-27 MED ORDER — TRELEGY ELLIPTA 100-62.5-25 MCG/ACT IN AEPB: 1.0000 | INHALATION_SPRAY | Freq: Every day | RESPIRATORY_TRACT | 3 refills | Status: DC

## 2021-06-27 MED ORDER — ALPRAZOLAM 0.5 MG PO TABS: 0.5000 mg | ORAL_TABLET | Freq: Every evening | ORAL | 1 refills | Status: DC | PRN

## 2021-06-27 NOTE — Assessment & Plan Note (Signed)
Cont on Coreg, Amlodipine 

## 2021-06-27 NOTE — Assessment & Plan Note (Addendum)
Better on Trelegy Will cont w/Trelegy QD Chest CT if needed

## 2021-06-27 NOTE — Assessment & Plan Note (Signed)
Continue on Metfomin XR, Actos Labs

## 2021-06-27 NOTE — Assessment & Plan Note (Signed)
Continue w/Alprazolam prn  Potential benefits of a long term benzodiazepines  use as well as potential risks  and complications were explained to the patient and were aknowledged.

## 2021-06-27 NOTE — Progress Notes (Signed)
Subjective:  Patient ID: Chase Mage., male    DOB: Apr 29, 1946  Age: 76 y.o. MRN: [id number]  CC: Follow-up (4 month f/u)   HPI [name]. presents for cough x 5 wks - resolved on Trelegy F/u on insomnia, DM, HTN  Outpatient Medications Prior to Visit  Medication Sig Dispense Refill   acetaminophen (TYLENOL) 650 MG CR tablet Take 650 mg by mouth 2 (two) times daily as needed for pain.      amLODipine (NORVASC) 10 MG tablet TAKE 1 TABLET (10 MG TOTAL) BY MOUTH DAILY BEFORE BREAKFAST. 90 tablet 3   aspirin EC 81 MG tablet Take 81 mg by mouth daily.     benzonatate (TESSALON) 100 MG capsule TAKE 1-2 CAPSULES TWICE DAILY AS NEEDED FOR COUGH 30 capsule 1   Blood Glucose Monitoring Suppl (ONETOUCH VERIO) w/Device KIT 1 Units by Does not apply route daily as needed. 1 kit 1   carvedilol (COREG) 25 MG tablet TAKE 1 TABLET BY MOUTH TWICE A DAY WITH MEALS 180 tablet 3   Cholecalciferol (VITAMIN D3) 1000 UNITS tablet Take 2,000 Units by mouth daily.     Coenzyme Q10 (CO Q-10) 200 MG CAPS Take 400 mg by mouth daily.      esomeprazole (NEXIUM) 40 MG capsule Take 1 capsule (40 mg total) by mouth daily. 30 capsule 3   Folic Acid 0.8 MG CAPS Take 2 capsules by mouth every morning.  30 each 0   glucose blood test strip USE TO TEST BLOOD SUGAR TWICE A DAY 100 each 3   hydrOXYzine (ATARAX/VISTARIL) 25 MG tablet TAKE 1-2 TABLETS AT BEDTIME AS NEED FOR INSOMNIA 180 tablet 1   KRILL OIL PO Take by mouth.     metFORMIN (GLUCOPHAGE-XR) 750 MG 24 hr tablet TAKE 1 TABLET BY MOUTH EVERY DAY WITH BREAKFAST 90 tablet 1   OneTouch Delica Lancets 37S MISC USE TO CHECK BLOOD SUGAR TWICE DAILY 100 each 3   ONETOUCH VERIO test strip USE TO CHECK BLOOD SUGAR DAILY 100 strip 5   pioglitazone (ACTOS) 45 MG tablet TAKE 1 TABLET (45 MG TOTAL) BY MOUTH EVERY OTHER DAY. (Patient taking differently: Take 22.5 mg by mouth every other day.) 45 tablet 3   traMADol (ULTRAM) 50 MG tablet Take 0.5-1 tablets (25-50  mg total) by mouth every 6 (six) hours as needed (Cough). 20 tablet 1   ALPRAZolam (XANAX) 0.5 MG tablet Take 1-2 tablets (0.5-1 mg total) by mouth at bedtime as needed for anxiety or sleep. 60 tablet 2   Fluticasone-Umeclidin-Vilant (TRELEGY ELLIPTA) 100-62.5-25 MCG/ACT AEPB Inhale 1 puff into the lungs daily. 1 each 11   albuterol (VENTOLIN HFA) 108 (90 Base) MCG/ACT inhaler INHALE 2 PUFFS INTO THE LUNGS EVERY 6 (SIX) HOURS AS NEEDED FOR WHEEZING (COUGH). (Patient not taking: Reported on 06/27/2021) 8.5 each 3   No facility-administered medications prior to visit.    ROS: Review of Systems  Constitutional:  Negative for appetite change, fatigue and unexpected weight change.  HENT:  Negative for congestion, nosebleeds, sneezing, sore throat and trouble swallowing.   Eyes:  Negative for itching and visual disturbance.  Respiratory:  Negative for cough.   Cardiovascular:  Negative for chest pain, palpitations and leg swelling.  Gastrointestinal:  Negative for abdominal distention, blood in stool, diarrhea and nausea.  Genitourinary:  Negative for frequency and hematuria.  Musculoskeletal:  Negative for back pain, gait problem, joint swelling and neck pain.  Skin:  Negative for rash.  Neurological:  Negative for dizziness, tremors, speech difficulty and weakness.  Psychiatric/Behavioral:  Positive for sleep disturbance. Negative for agitation and dysphoric mood. The patient is not nervous/anxious.    Objective:  BP 120/76 (BP Location: Left Arm)    Pulse 70    Temp 98.3 F (36.8 C) (Oral)    Ht '5\' 4"'  (1.626 m)    Wt 201 lb 6.4 oz (91.4 kg)    SpO2 92%    BMI 34.57 kg/m   BP Readings from Last 3 Encounters:  06/27/21 120/76  02/21/21 130/82  06/21/20 130/72    Wt Readings from Last 3 Encounters:  06/27/21 201 lb 6.4 oz (91.4 kg)  02/21/21 197 lb (89.4 kg)  06/21/20 194 lb 9.6 oz (88.3 kg)    Physical Exam Constitutional:      General: He is not in acute distress.    Appearance:  He is well-developed. He is obese.     Comments: NAD  Eyes:     Conjunctiva/sclera: Conjunctivae normal.     Pupils: Pupils are equal, round, and reactive to light.  Neck:     Thyroid: No thyromegaly.     Vascular: No JVD.  Cardiovascular:     Rate and Rhythm: Normal rate and regular rhythm.     Heart sounds: Normal heart sounds. No murmur heard.   No friction rub. No gallop.  Pulmonary:     Effort: Pulmonary effort is normal. No respiratory distress.     Breath sounds: Normal breath sounds. No wheezing or rales.  Chest:     Chest wall: No tenderness.  Abdominal:     General: Bowel sounds are normal. There is no distension.     Palpations: Abdomen is soft. There is no mass.     Tenderness: There is no abdominal tenderness. There is no guarding or rebound.  Musculoskeletal:        General: No tenderness. Normal range of motion.     Cervical back: Normal range of motion.  Lymphadenopathy:     Cervical: No cervical adenopathy.  Skin:    General: Skin is warm and dry.     Findings: No rash.  Neurological:     Mental Status: He is alert and oriented to person, place, and time.     Cranial Nerves: No cranial nerve deficit.     Motor: No abnormal muscle tone.     Coordination: Coordination normal.     Gait: Gait normal.     Deep Tendon Reflexes: Reflexes are normal and symmetric.  Psychiatric:        Behavior: Behavior normal.        Thought Content: Thought content normal.        Judgment: Judgment normal.    Lab Results  Component Value Date   WBC 5.9 10/19/2020   HGB 16.4 10/19/2020   HCT 48.2 10/19/2020   PLT 226.0 10/19/2020   GLUCOSE 139 (H) 02/21/2021   CHOL 216 (H) 10/19/2020   TRIG 94.0 10/19/2020   HDL 81.50 10/19/2020   LDLCALC 115 (H) 10/19/2020   ALT 23 02/21/2021   AST 21 02/21/2021   NA 138 02/21/2021   K 4.2 02/21/2021   CL 102 02/21/2021   CREATININE 0.84 02/21/2021   BUN 14 02/21/2021   CO2 29 02/21/2021   TSH 3.09 10/19/2020   PSA 1.13  10/19/2020   HGBA1C 6.9 (H) 02/21/2021   MICROALBUR 1.9 06/21/2020    No results found.  Assessment & Plan:   Problem List Items Addressed  This Visit     Diabetes type 2, controlled ([place])    Continue on Metfomin XR, Actos Labs      Relevant Orders   Comprehensive metabolic panel   Hemoglobin A1c   HTN (hypertension)    Cont on Coreg, Amlodipine      Insomnia    Continue w/Alprazolam prn  Potential benefits of a long term benzodiazepines  use as well as potential risks  and complications were explained to the patient and were aknowledged.      Intrinsic asthma    Better on Trelegy Will cont w/Trelegy QD Chest CT if needed      Relevant Medications   Fluticasone-Umeclidin-Vilant (TRELEGY ELLIPTA) 100-62.5-25 MCG/ACT AEPB      Meds ordered this encounter  Medications   Fluticasone-Umeclidin-Vilant (TRELEGY ELLIPTA) 100-62.5-25 MCG/ACT AEPB    Sig: Inhale 1 puff into the lungs daily.    Dispense:  3 each    Refill:  3   ALPRAZolam (XANAX) 0.5 MG tablet    Sig: Take 1-2 tablets (0.5-1 mg total) by mouth at bedtime as needed for anxiety or sleep.    Dispense:  180 tablet    Refill:  1      Follow-up: Return in about 4 months (around 10/25/2021) for Wellness Exam.  [name], MD

## 2021-06-30 ENCOUNTER — Other Ambulatory Visit: Payer: Self-pay | Admitting: Internal Medicine

## 2021-07-19 ENCOUNTER — Encounter: Payer: Self-pay | Admitting: Internal Medicine

## 2021-07-30 ENCOUNTER — Encounter: Payer: Self-pay | Admitting: Gastroenterology

## 2021-08-06 ENCOUNTER — Encounter: Payer: Self-pay | Admitting: Gastroenterology

## 2021-08-15 ENCOUNTER — Ambulatory Visit: Payer: PPO | Admitting: Internal Medicine

## 2021-08-17 ENCOUNTER — Ambulatory Visit: Payer: PPO | Admitting: Gastroenterology

## 2021-08-20 ENCOUNTER — Encounter: Payer: Self-pay | Admitting: Internal Medicine

## 2021-08-20 ENCOUNTER — Ambulatory Visit (INDEPENDENT_AMBULATORY_CARE_PROVIDER_SITE_OTHER): Payer: PPO | Admitting: Internal Medicine

## 2021-08-20 ENCOUNTER — Other Ambulatory Visit: Payer: Self-pay

## 2021-08-20 VITALS — BP 130/80 | HR 68 | Temp 98.1°F | Ht 64.0 in | Wt 199.0 lb

## 2021-08-20 DIAGNOSIS — J45909 Unspecified asthma, uncomplicated: Secondary | ICD-10-CM

## 2021-08-20 DIAGNOSIS — R053 Chronic cough: Secondary | ICD-10-CM

## 2021-08-20 DIAGNOSIS — I10 Essential (primary) hypertension: Secondary | ICD-10-CM

## 2021-08-20 DIAGNOSIS — K219 Gastro-esophageal reflux disease without esophagitis: Secondary | ICD-10-CM

## 2021-08-20 DIAGNOSIS — M5440 Lumbago with sciatica, unspecified side: Secondary | ICD-10-CM

## 2021-08-20 DIAGNOSIS — E119 Type 2 diabetes mellitus without complications: Secondary | ICD-10-CM

## 2021-08-20 DIAGNOSIS — G8929 Other chronic pain: Secondary | ICD-10-CM

## 2021-08-20 LAB — COMPREHENSIVE METABOLIC PANEL
ALT: 27 U/L (ref 0–53)
AST: 27 U/L (ref 0–37)
Albumin: 4.1 g/dL (ref 3.5–5.2)
Alkaline Phosphatase: 92 U/L (ref 39–117)
BUN: 15 mg/dL (ref 6–23)
CO2: 30 mEq/L (ref 19–32)
Calcium: 9.6 mg/dL (ref 8.4–10.5)
Chloride: 100 mEq/L (ref 96–112)
Creatinine, Ser: 0.87 mg/dL (ref 0.40–1.50)
GFR: 83.37 mL/min (ref >60.00)
Glucose, Bld: 142 mg/dL — ABNORMAL HIGH (ref 70–99)
Potassium: 4.3 mEq/L (ref 3.5–5.1)
Sodium: 137 mEq/L (ref 135–145)
Total Bilirubin: 0.5 mg/dL (ref 0.2–1.2)
Total Protein: 7.1 g/dL (ref 6.0–8.3)

## 2021-08-20 LAB — HEMOGLOBIN A1C: Hgb A1c MFr Bld: 7.3 % — ABNORMAL HIGH (ref 4.6–6.5)

## 2021-08-20 MED ORDER — MONTELUKAST SODIUM 10 MG PO TABS: 10.0000 mg | ORAL_TABLET | Freq: Every day | ORAL | 3 refills | Status: DC

## 2021-08-20 MED ORDER — LORATADINE 10 MG PO TABS: 10.0000 mg | ORAL_TABLET | Freq: Every day | ORAL | 3 refills | Status: DC

## 2021-08-20 NOTE — Assessment & Plan Note (Signed)
Check A1c. 

## 2021-08-20 NOTE — Assessment & Plan Note (Signed)
Cont on Coreg, Amlodipine 

## 2021-08-20 NOTE — Assessment & Plan Note (Addendum)
Worse ?Cont w/Trelegy ?Add Claritin and Singulair ?Start Hycodan ?Depo-medrol IM 80 mg ?CXR vs CT - will get a CT ?

## 2021-08-20 NOTE — Progress Notes (Signed)
? ?Subjective:  ?Patient ID: [name]., male    DOB: March 20, 1946  Age: 76 y.o. MRN: [id number] ? ?CC: No chief complaint on file. ? ? ?HPI ?[name]. presents for severe cough x 3 weeks ?Trelegy is helping ?C/o LBP, GERD, DM, HTN ? ?Outpatient Medications Prior to Visit  ?Medication Sig Dispense Refill  ? acetaminophen (TYLENOL) 650 MG CR tablet Take 650 mg by mouth 2 (two) times daily as needed for pain.     ? ALPRAZolam (XANAX) 0.5 MG tablet Take 1-2 tablets (0.5-1 mg total) by mouth at bedtime as needed for anxiety or sleep. 180 tablet 1  ? amLODipine (NORVASC) 10 MG tablet TAKE 1 TABLET (10 MG TOTAL) BY MOUTH DAILY BEFORE BREAKFAST. 90 tablet 3  ? aspirin EC 81 MG tablet Take 81 mg by mouth daily.    ? benzonatate (TESSALON) 100 MG capsule TAKE 1-2 CAPSULES TWICE DAILY AS NEEDED FOR COUGH 30 capsule 1  ? Blood Glucose Monitoring Suppl (ONETOUCH VERIO) w/Device KIT 1 Units by Does not apply route daily as needed. 1 kit 1  ? carvedilol (COREG) 25 MG tablet TAKE 1 TABLET BY MOUTH TWICE A DAY WITH MEALS 180 tablet 3  ? Cholecalciferol (VITAMIN D3) 1000 UNITS tablet Take 2,000 Units by mouth daily.    ? Coenzyme Q10 (CO Q-10) 200 MG CAPS Take 400 mg by mouth daily.     ? esomeprazole (NEXIUM) 40 MG capsule TAKE 1 CAPSULE (40 MG TOTAL) BY MOUTH DAILY. 90 capsule 3  ? Fluticasone-Umeclidin-Vilant (TRELEGY ELLIPTA) 100-62.5-25 MCG/ACT AEPB Inhale 1 puff into the lungs daily. 3 each 3  ? Folic Acid 0.8 MG CAPS Take 2 capsules by mouth every morning.  30 each 0  ? glucose blood test strip USE TO TEST BLOOD SUGAR TWICE A DAY 100 each 3  ? hydrOXYzine (ATARAX/VISTARIL) 25 MG tablet TAKE 1-2 TABLETS AT BEDTIME AS NEED FOR INSOMNIA 180 tablet 1  ? KRILL OIL PO Take by mouth.    ? metFORMIN (GLUCOPHAGE-XR) 750 MG 24 hr tablet TAKE 1 TABLET BY MOUTH EVERY DAY WITH BREAKFAST 90 tablet 1  ? OneTouch Delica Lancets 82S MISC USE TO CHECK BLOOD SUGAR TWICE DAILY 100 each 3  ? ONETOUCH VERIO test strip USE TO  CHECK BLOOD SUGAR DAILY 100 strip 5  ? pioglitazone (ACTOS) 45 MG tablet Take 0.5 tablets (22.5 mg total) by mouth every other day. 45 tablet 3  ? traMADol (ULTRAM) 50 MG tablet Take 0.5-1 tablets (25-50 mg total) by mouth every 6 (six) hours as needed (Cough). 20 tablet 1  ? ?No facility-administered medications prior to visit.  ? ? ?ROS: ?Review of Systems  ?Constitutional:  Negative for appetite change, fatigue and unexpected weight change.  ?HENT:  Positive for congestion. Negative for nosebleeds, postnasal drip, rhinorrhea, sneezing, sore throat and trouble swallowing.   ?Eyes:  Negative for itching and visual disturbance.  ?Respiratory:  Positive for cough and chest tightness.   ?Cardiovascular:  Negative for chest pain, palpitations and leg swelling.  ?Gastrointestinal:  Negative for abdominal distention, blood in stool, diarrhea and nausea.  ?Genitourinary:  Negative for frequency and hematuria.  ?Musculoskeletal:  Negative for back pain, gait problem, joint swelling and neck pain.  ?Skin:  Negative for rash.  ?Neurological:  Negative for dizziness, tremors, speech difficulty and weakness.  ?Psychiatric/Behavioral:  Negative for agitation, dysphoric mood and sleep disturbance. The patient is not nervous/anxious.   ? ?Objective:  ?BP 130/80 (BP Location: Left Arm, Patient  Position: Sitting, Cuff Size: Large)   Pulse 68   Temp 98.1 ?F (36.7 ?C) (Oral)   Ht 5' 4" (1.626 m)   Wt 199 lb (90.3 kg)   SpO2 93%   BMI 34.16 kg/m?  ? ?BP Readings from Last 3 Encounters:  ?08/20/21 130/80  ?06/27/21 120/76  ?02/21/21 130/82  ? ? ?Wt Readings from Last 3 Encounters:  ?08/20/21 199 lb (90.3 kg)  ?06/27/21 201 lb 6.4 oz (91.4 kg)  ?02/21/21 197 lb (89.4 kg)  ? ? ?Physical Exam ?Constitutional:   ?   General: He is not in acute distress. ?   Appearance: He is well-developed. He is obese.  ?   Comments: NAD  ?Eyes:  ?   Conjunctiva/sclera: Conjunctivae normal.  ?   Pupils: Pupils are equal, round, and reactive to  light.  ?Neck:  ?   Thyroid: No thyromegaly.  ?   Vascular: No JVD.  ?Cardiovascular:  ?   Rate and Rhythm: Normal rate and regular rhythm.  ?   Heart sounds: Normal heart sounds. No murmur heard. ?  No friction rub. No gallop.  ?Pulmonary:  ?   Effort: Pulmonary effort is normal. No respiratory distress.  ?   Breath sounds: Normal breath sounds. No wheezing or rales.  ?Chest:  ?   Chest wall: No tenderness.  ?Abdominal:  ?   General: Bowel sounds are normal. There is no distension.  ?   Palpations: Abdomen is soft. There is no mass.  ?   Tenderness: There is no abdominal tenderness. There is no guarding or rebound.  ?Musculoskeletal:     ?   General: No tenderness. Normal range of motion.  ?   Cervical back: Normal range of motion.  ?Lymphadenopathy:  ?   Cervical: No cervical adenopathy.  ?Skin: ?   General: Skin is warm and dry.  ?   Findings: No rash.  ?Neurological:  ?   Mental Status: He is alert and oriented to person, place, and time.  ?   Cranial Nerves: No cranial nerve deficit.  ?   Motor: No abnormal muscle tone.  ?   Coordination: Coordination normal.  ?   Gait: Gait normal.  ?   Deep Tendon Reflexes: Reflexes are normal and symmetric.  ?Psychiatric:     ?   Behavior: Behavior normal.     ?   Thought Content: Thought content normal.     ?   Judgment: Judgment normal.  ? ? ?Lab Results  ?Component Value Date  ? WBC 5.9 10/19/2020  ? HGB 16.4 10/19/2020  ? HCT 48.2 10/19/2020  ? PLT 226.0 10/19/2020  ? GLUCOSE 130 (H) 06/27/2021  ? CHOL 216 (H) 10/19/2020  ? TRIG 94.0 10/19/2020  ? HDL 81.50 10/19/2020  ? [place] 115 (H) 10/19/2020  ? ALT 19 06/27/2021  ? AST 23 06/27/2021  ? NA 138 06/27/2021  ? K 4.5 06/27/2021  ? CL 101 06/27/2021  ? CREATININE 0.86 06/27/2021  ? BUN 13 06/27/2021  ? CO2 31 06/27/2021  ? TSH 3.09 10/19/2020  ? PSA 1.13 10/19/2020  ? HGBA1C 7.1 (H) 06/27/2021  ? MICROALBUR 1.9 06/21/2020  ? ? ?No results found. ? ?Assessment & Plan:  ? ?Problem List Items Addressed This Visit   ? ? GERD   ?  He will see Dr Loletha Grayer. tomorrow ?  ?  ? Intrinsic asthma  ?  Worse ?Cont w/Trelegy ?Add Claritin and Singulair ?Start Hycodan ?Depo-medrol IM 80 mg ?CXR vs CT -  will get a CT ?  ?  ? Relevant Medications  ? montelukast (SINGULAIR) 10 MG tablet  ? Other Relevant Orders  ? CT Chest Wo Contrast  ? Diabetes type 2, controlled ([place])  ?  Check A1c ?  ?  ? Relevant Orders  ? Hemoglobin A1c  ? Comprehensive metabolic panel  ? Low back pain  ?  Tramadol prn ?Try Blue-Emu cream ? Potential benefits of a long term opioids use as well as potential risks (i.e. addiction risk, apnea etc) and complications (i.e. Somnolence, constipation and others) were explained to the patient and were aknowledged. ? ?  ?  ? ?Other Visit Diagnoses   ? ? Chronic cough    -  Primary  ? Relevant Orders  ? CT Chest Wo Contrast  ? ?  ?  ? ? ?Meds ordered this encounter  ?Medications  ? loratadine (CLARITIN) 10 MG tablet  ?  Sig: Take 1 tablet (10 mg total) by mouth daily.  ?  Dispense:  90 tablet  ?  Refill:  3  ? montelukast (SINGULAIR) 10 MG tablet  ?  Sig: Take 1 tablet (10 mg total) by mouth daily.  ?  Dispense:  90 tablet  ?  Refill:  3  ?  ? ? ?Follow-up: Return in about 6 weeks (around 10/01/2021) for a follow-up visit. ? ?[name], MD ?

## 2021-08-20 NOTE — Patient Instructions (Signed)
Blue-Emu cream ?

## 2021-08-20 NOTE — Assessment & Plan Note (Signed)
Tramadol prn ?Try Blue-Emu cream ? Potential benefits of a long term opioids use as well as potential risks (i.e. addiction risk, apnea etc) and complications (i.e. Somnolence, constipation and others) were explained to the patient and were aknowledged. ? ?

## 2021-08-20 NOTE — Assessment & Plan Note (Signed)
He will see Dr Loletha Grayer. tomorrow ?

## 2021-08-21 ENCOUNTER — Encounter: Payer: Self-pay | Admitting: Gastroenterology

## 2021-08-21 ENCOUNTER — Ambulatory Visit (INDEPENDENT_AMBULATORY_CARE_PROVIDER_SITE_OTHER): Payer: PPO | Admitting: Gastroenterology

## 2021-08-21 ENCOUNTER — Encounter: Payer: Self-pay | Admitting: Internal Medicine

## 2021-08-21 VITALS — BP 132/84 | HR 70 | Ht 64.0 in | Wt 201.0 lb

## 2021-08-21 DIAGNOSIS — K219 Gastro-esophageal reflux disease without esophagitis: Secondary | ICD-10-CM

## 2021-08-21 DIAGNOSIS — Z5320 Procedure and treatment not carried out because of patient's decision for unspecified reasons: Secondary | ICD-10-CM

## 2021-08-21 NOTE — Progress Notes (Signed)
? ? ?          ?Chief Complaint: GERD, colon cancer screening ? ? ?Referring Provider:     Cassandria Anger, MD ? ? ? ?HPI:   ? ? ?[name]. is a 76 y.o. male with a history of asthma, COPD, diabetes, HTN, hyperlipidemia, GERD, referred to the [facility] for evaluation of GERD and colon cancer screening. ? ?Has a long-standing hx of GERD, and recently started taking Nexium 40 mg/day again with good control of sxs. Index sxs of HB. No dysphagia. Worse with spicy foods. No prior EGD.  No breakthrough symptoms on Nexium. ? ?Also would like to discuss ongoing CRC screening colonoscopy. Last was 2012 and n/f diverticulosis, but no polyps.  He is otherwise without lower GI symptoms. ? ?No known family history of CRC, GI malignancy, liver disease, pancreatic disease, or IBD.  ? ? ?Endoscopic History: ?- Colonoscopy (11/2000): Internal hemorrhoids, otherwise normal ?- Colonoscopy (03/2011): Pandiverticulosis, otherwise normal.  Repeat in 10 years ? ? ? ?  Latest Ref Rng & Units 10/19/2020  ?  7:44 AM 06/21/2020  ?  9:49 AM 06/16/2019  ?  9:10 AM  ?CBC  ?WBC 4.0 - 10.5 K/uL 5.9   5.0   5.5    ?Hemoglobin 13.0 - 17.0 g/dL 16.4   14.6   15.6    ?Hematocrit 39.0 - 52.0 % 48.2   41.9   47.1    ?Platelets 150.0 - 400.0 K/uL 226.0   236.0   207.0    ? ? ? ? ?  Latest Ref Rng & Units 08/20/2021  ?  9:45 AM 06/27/2021  ? 10:09 AM 02/21/2021  ? 10:03 AM  ?CMP  ?Glucose 70 - 99 mg/dL 142   130   139    ?BUN 6 - 23 mg/dL _0 ?Creatinine 0.40 - 1.50 mg/dL 0.87   0.86   0.84    ?Sodium 135 - 145 mEq/L 137   138   138    ?Potassium 3.5 - 5.1 mEq/L 4.3   4.5   4.2    ?Chloride 96 - 112 mEq/L 100   101   102    ?CO2 19 - 32 mEq/L _1 ?Calcium 8.4 - 10.5 mg/dL 9.6   9.2   9.3    ?Total Protein 6.0 - 8.3 g/dL 7.1   7.0   6.8    ?Total Bilirubin 0.2 - 1.2 mg/dL 0.5   0.6   0.5    ?Alkaline Phos 39 - 117 U/L 92   88   87    ?AST 0 - 37 U/L _2 ?ALT 0 - 53 U/L _3 ? ? ? ?Past  Medical History:  ?Diagnosis Date  ? Asthma   ? mild, chronic cough  ? Chronic kidney disease   ? kidney stone and infection- started 03-27-11  ? COPD (chronic obstructive pulmonary disease) ([place])   ? Diabetes mellitus   ? GERD (gastroesophageal reflux disease)   ? Hyperlipidemia   ? Hypertension   ? Obesity   ? ? ? ?Past Surgical History:  ?Procedure Laterality Date  ? APPENDECTOMY  1963  ? CYSTOSCOPY W/ URETERAL STENT PLACEMENT  03/26/11  ? removal of kidney stone  ? MASS EXCISION Left 06/16/2020  ? Procedure: MINOR  EXCISION OF MASS;  Surgeon: Izora Gala, MD;  Location: [facility];  Service: ENT;  Laterality: Left;  ? ?Family History  ?Problem Relation Age of Onset  ? Multiple sclerosis Mother   ? Heart disease Father 56  ?     MI  ? Diabetes Father   ? Hypertension Father   ? Heart disease Sister 26  ?     CAD  ? Diabetes Sister   ? Diabetes Brother   ? Parkinsonism Brother   ? Hypertension Brother   ? Hypertension Other   ? Coronary artery disease Other   ? Colon cancer Neg Hx   ? Esophageal cancer Neg Hx   ? Stomach cancer Neg Hx   ? Cancer - Colon Neg Hx   ? ?Social History  ? ?Tobacco Use  ? Smoking status: Former  ?  Packs/day: 1.50  ?  Years: 40.00  ?  Pack years: 60.00  ?  Types: Cigarettes  ?  Quit date: 08/13/2001  ?  Years since quitting: 20.0  ? Smokeless tobacco: Never  ? Tobacco comments:  ?  Counseled to remain smoke free.  ?Vaping Use  ? Vaping Use: Never used  ?Substance Use Topics  ? Alcohol use: Yes  ?  Alcohol/week: 10.0 standard drinks  ?  Types: 10 Glasses of wine per week  ?  Comment: 2 glasses  ? Drug use: No  ? ?Current Outpatient Medications  ?Medication Sig Dispense Refill  ? acetaminophen (TYLENOL) 650 MG CR tablet Take 650 mg by mouth 2 (two) times daily as needed for pain.     ? ALPRAZolam (XANAX) 0.5 MG tablet Take 1-2 tablets (0.5-1 mg total) by mouth at bedtime as needed for anxiety or sleep. 180 tablet 1  ? amLODipine (NORVASC) 10 MG tablet TAKE 1 TABLET (10 MG  TOTAL) BY MOUTH DAILY BEFORE BREAKFAST. (Patient taking differently: Take 5 mg by mouth daily before breakfast.) 90 tablet 3  ? aspirin EC 81 MG tablet Take 81 mg by mouth daily.    ? benzonatate (TESSALON) 100 MG capsule TAKE 1-2 CAPSULES TWICE DAILY AS NEEDED FOR COUGH 30 capsule 1  ? Blood Glucose Monitoring Suppl (ONETOUCH VERIO) w/Device KIT 1 Units by Does not apply route daily as needed. 1 kit 1  ? carvedilol (COREG) 25 MG tablet TAKE 1 TABLET BY MOUTH TWICE A DAY WITH MEALS 180 tablet 3  ? Cholecalciferol (VITAMIN D3) 1000 UNITS tablet Take 2,000 Units by mouth daily.    ? Coenzyme Q10 (CO Q-10) 200 MG CAPS Take 400 mg by mouth daily.     ? esomeprazole (NEXIUM) 40 MG capsule TAKE 1 CAPSULE (40 MG TOTAL) BY MOUTH DAILY. 90 capsule 3  ? Fluticasone-Umeclidin-Vilant (TRELEGY ELLIPTA) 100-62.5-25 MCG/ACT AEPB Inhale 1 puff into the lungs daily. 3 each 3  ? Folic Acid 0.8 MG CAPS Take 2 capsules by mouth every morning.  30 each 0  ? glucose blood test strip USE TO TEST BLOOD SUGAR TWICE A DAY 100 each 3  ? hydrOXYzine (ATARAX/VISTARIL) 25 MG tablet TAKE 1-2 TABLETS AT BEDTIME AS NEED FOR INSOMNIA 180 tablet 1  ? KRILL OIL PO Take by mouth.    ? loratadine (CLARITIN) 10 MG tablet Take 1 tablet (10 mg total) by mouth daily. 90 tablet 3  ? metFORMIN (GLUCOPHAGE-XR) 750 MG 24 hr tablet TAKE 1 TABLET BY MOUTH EVERY DAY WITH BREAKFAST 90 tablet 1  ? montelukast (SINGULAIR) 10 MG tablet Take 1 tablet (10 mg  total) by mouth daily. 90 tablet 3  ? OneTouch Delica Lancets 33I MISC USE TO CHECK BLOOD SUGAR TWICE DAILY 100 each 3  ? ONETOUCH VERIO test strip USE TO CHECK BLOOD SUGAR DAILY 100 strip 5  ? pioglitazone (ACTOS) 45 MG tablet Take 0.5 tablets (22.5 mg total) by mouth every other day. 45 tablet 3  ? traMADol (ULTRAM) 50 MG tablet Take 0.5-1 tablets (25-50 mg total) by mouth every 6 (six) hours as needed (Cough). 20 tablet 1  ? ?No current facility-administered medications for this visit.  ? ?Allergies  ?Allergen  Reactions  ? Olmesartan   ?  hives  ? Other   ?  Other reaction(s): Cough (ALLERGY/intolerance) ?cough  ? Pravastatin   ?  aches  ? Symbicort [Budesonide-Formoterol Fumarate]   ?  cough  ? ? ? ?Review of Systems: ?All systems reviewed and negative except where noted in HPI.  ? ? ? ?Physical Exam:   ? ?Wt Readings from Last 3 Encounters:  ?08/21/21 201 lb (91.2 kg)  ?08/20/21 199 lb (90.3 kg)  ?06/27/21 201 lb 6.4 oz (91.4 kg)  ? ? ?BP 132/84   Pulse 70   Ht 5' 4" (1.626 m)   Wt 201 lb (91.2 kg)   BMI 34.50 kg/m?  ?Constitutional:  Pleasant, in no acute distress. ?Psychiatric: Normal mood and affect. Behavior is normal. ?Cardiovascular: Normal rate, regular rhythm. No edema ?Pulmonary/chest: Effort normal and breath sounds normal. No wheezing, rales or rhonchi. ?Abdominal: Soft, nondistended, nontender. Bowel sounds active throughout. There are no masses palpable. No hepatomegaly. ?Neurological: Alert and oriented to person place and time. ?Skin: Skin is warm and dry. No rashes noted. ? ? ?ASSESSMENT AND PLAN;  ? ?1) GERD ?- Symptoms well controlled on Nexium 40 mg/day ?- Continue antireflux lifestyle/dietary modifications with avoidance of exacerbating foods ?- Offered EGD for Barrett's Esophagus screening per GI societal guideline recommendations.  Patient politely declined.  Told me he would think about it some more, and if he reconsiders, can simply call to schedule EGD in [place] ? ?2) Colon cancer screening-declined ?- Last colonoscopy was 2012.  No prior history of polyps.  No lower GI symptoms.  No family history.  Discussed current GI societal guideline recommendations for CRC screening in the 75+ population.  He does not want to proceed with continued CRC screening, to include colonoscopy ?- As above, patient can always call if he changes his mind ? ? ?RTC prn ? ? ? ?Lavena Bullion, DO, FACG  08/21/2021, 10:10 AM ? ? ?Plotnikov, Evie Lacks, MD ? ?

## 2021-08-21 NOTE — Patient Instructions (Signed)
If you are age 78 or older, your body mass index should be between 23-30. Your Body mass index is 34.5 kg/m?[name] If this is out of the aforementioned range listed, please consider follow up with your Primary Care Provider. ? ?If you are age 21 or younger, your body mass index should be between 19-25. Your Body mass index is 34.5 kg/m?[name] If this is out of the aformentioned range listed, please consider follow up with your Primary Care Provider.  ? ?________________________________________________________ ? ?The [facility] providers would like to encourage you to use [facility] to communicate with providers for non-urgent requests or questions.  Due to long hold times on the telephone, sending your provider a message by [facility] may be a faster and more efficient way to get a response.  Please allow 48 business hours for a response.  Please remember that this is for non-urgent requests.  ?_______________________________________________________ ? ?Please follow up as needed. ? ?It was a pleasure to see you today! ? ?Gerrit Heck, D.O. ? ? ? ? ? ? ?We want to thank you for trusting [facility] with your care. All of our staff and providers value the relationships we have built with our patients, and it is an honor to care for you.  ? ?We are writing to let you know that [facility] will close on Oct 08, 2021, and we invite you to continue to see Dr. Carmell Austria and Gerrit Heck at the [facility] [place] office location. We are consolidating our serices at these [facility] practices to better provide care. Our office staff will work with you to ensure a seamless transition.  ? ?Gerrit Heck, DO -Dr. Bryan Lemma will be movig to [facility] at [address] [place], [place] [address], effective Oct 08, 2021.  Contact [phone] to schedule an appointment with him.  ? ?Carmell Austria, MD- Dr. Lyndel Safe will be movig to [facility] at [address], [place],  [address], effective Oct 08, 2021.  Contact [phone] to schedule an appointment with him.  ? ?Requesting Medical Records ?If you need to request your medical records, please follow the instructions below. Your medical records are confidential, and a copy can be transferred to another provider or released to you or another person you designate only with your permission. ? ?There are several ways to request your medical records: ?Requests for medical records can be submitted through our practice.   ?You can also request your records electronically, in your MyChart account by selecting the ?Request Health Records? tab.  ?If you need additional information on how to request records, please go to [email or web address]/, choose Patient Information, then select Request Medical Records. ?To make an appointment or if you have any questions about your health care needs, please contact our office at [phone] and one of our staff members will be glad to assist you. ?[facility] is committed to providing exceptional care for you and our community. Thank you for allowing [place] to serve your health care needs. ?Sincerely, ? ?Windy Canny, Director [facility] ?[place] also offers convenient virtual care options. Sore throat? Sinus problems? Cold or flu symptoms? Get care from the comfort of home with [facility] Video Visits and e-Visits. Learn more about the non-emergency conditions treated and start your virtual visit at [email or web address] ? ?

## 2021-08-28 ENCOUNTER — Other Ambulatory Visit: Payer: PPO

## 2021-08-29 ENCOUNTER — Ambulatory Visit (INDEPENDENT_AMBULATORY_CARE_PROVIDER_SITE_OTHER)
Admission: RE | Admit: 2021-08-29 | Discharge: 2021-08-29 | Disposition: A | Discharge status: Home / Self Care | Payer: PPO | Source: Ambulatory Visit | Attending: Internal Medicine | Admitting: Internal Medicine

## 2021-08-29 DIAGNOSIS — J45909 Unspecified asthma, uncomplicated: Secondary | ICD-10-CM | POA: Diagnosis not present

## 2021-08-29 DIAGNOSIS — R053 Chronic cough: Secondary | ICD-10-CM

## 2021-08-29 DIAGNOSIS — R918 Other nonspecific abnormal finding of lung field: Secondary | ICD-10-CM | POA: Diagnosis not present

## 2021-08-29 DIAGNOSIS — I700 Atherosclerosis of aorta: Secondary | ICD-10-CM | POA: Diagnosis not present

## 2021-08-29 DIAGNOSIS — J439 Emphysema, unspecified: Secondary | ICD-10-CM | POA: Diagnosis not present

## 2021-08-30 ENCOUNTER — Encounter: Payer: Self-pay | Admitting: Internal Medicine

## 2021-09-01 ENCOUNTER — Other Ambulatory Visit: Payer: Self-pay | Admitting: Internal Medicine

## 2021-09-01 DIAGNOSIS — R053 Chronic cough: Secondary | ICD-10-CM

## 2021-09-03 NOTE — Telephone Encounter (Signed)
Pls advise if pt should take 1 actos a day.Marland KitchenJohny Harrell ?

## 2021-09-04 ENCOUNTER — Other Ambulatory Visit: Payer: Self-pay | Admitting: Internal Medicine

## 2021-09-04 MED ORDER — PIOGLITAZONE HCL 45 MG PO TABS: 45.0000 mg | ORAL_TABLET | ORAL | 3 refills | Status: DC

## 2021-09-18 ENCOUNTER — Institutional Professional Consult (permissible substitution): Payer: PPO | Admitting: Internal Medicine

## 2021-09-19 ENCOUNTER — Encounter: Payer: Self-pay | Admitting: Internal Medicine

## 2021-09-19 ENCOUNTER — Ambulatory Visit: Payer: PPO | Admitting: Internal Medicine

## 2021-09-19 DIAGNOSIS — I10 Essential (primary) hypertension: Secondary | ICD-10-CM

## 2021-09-19 DIAGNOSIS — R011 Cardiac murmur, unspecified: Secondary | ICD-10-CM

## 2021-09-19 DIAGNOSIS — J45991 Cough variant asthma: Secondary | ICD-10-CM | POA: Diagnosis not present

## 2021-09-19 DIAGNOSIS — R9389 Abnormal findings on diagnostic imaging of other specified body structures: Secondary | ICD-10-CM

## 2021-09-19 MED ORDER — BISOPROLOL FUMARATE 5 MG PO TABS: ORAL_TABLET | ORAL | 11 refills | Status: DC

## 2021-09-19 MED ORDER — BUDESONIDE-FORMOTEROL FUMARATE 80-4.5 MCG/ACT IN AERO
INHALATION_SPRAY | RESPIRATORY_TRACT | 12 refills | Status: DC
Start: 2021-09-19 — End: 2022-06-10

## 2021-09-19 NOTE — Assessment & Plan Note (Addendum)
Quit smoking 2003 ?- onset was in childhood, worse when smoking  ?- seen 2012 rec dulera and did not return to clinic ?- 09/19/2021 rec trial of symbicort 80 2bid and max gerd rx/ change coreg to bisoprolol  ? ?The most common causes of chronic cough in immunocompetent adults include the following: upper airway cough syndrome (UACS), previously referred to as postnasal drip syndrome (PNDS), which is caused by variety of rhinosinus conditions; (2) asthma; (3) GERD; (4) chronic bronchitis from cigarette smoking or other inhaled environmental irritants; (5) nonasthmatic eosinophilic bronchitis; and (6) bronchiectasis. ? ? These conditions, singly or in combination, have accounted for up to 94% of the causes of chronic cough in prospective studies. ? ? Other conditions have constituted no >6% of the causes in prospective studies These have included bronchogenic carcinoma, chronic interstitial pneumonia, sarcoidosis, left ventricular failure, ACEI-induced cough, and aspiration from a condition associated with pharyngeal dysfunction.   ? ?Chronic cough is often simultaneously caused by more than one condition. A single cause has been found from 38 to 82% of the time, multiple causes from 18 to 62%. Multiply caused cough has been the result of three diseases up to 42% of the time.  ? ?Hard to tease apart asthma from Upper airway cough syndrome (previously labeled PNDS),  is so named because it's frequently impossible to sort out how much is  CR/sinusitis with freq throat clearing (which can be related to primary GERD)   vs  causing  secondary (" extra esophageal")  GERD from wide swings in gastric pressure that occur with throat clearing, often  promoting self use of mint and menthol lozenges that reduce the lower esophageal sphincter tone and exacerbate the problem further in a cyclical fashion.  ? ?These are the same pts (now being labeled as having "irritable larynx syndrome" by some cough centers) who not infrequently  have a history of having failed to tolerate ace inhibitors,  dry powder inhalers(eg Trelegy) or biphosphonates or report having atypical/extraesophageal reflux symptoms that don't respond to standard doses of PPI  and are easily confused as having aecopd or asthma flares by even experienced allergists/ pulmonologists (myself included).  ? ? ?rec  ?- The proper method of use, as well as anticipated side effects, of a metered-dose inhaler were discussed and demonstrated to the patient using teach back method. rechallenge with Symbicort 80 (or dulera 100) 2bid and max rx for gerd  ?D/c coreg as above  ?F/u in 6 weeks, call sooner if needed  ?   ? ? ?

## 2021-09-19 NOTE — Progress Notes (Signed)
Subjective:  ?  ? Patient ID: [name], male   DOB: [birth year],    MRN: [id number] ? ?  ?Brief patient profile:  ?40   yowm quit smoking 2003 with some coughing that improved p quit but never completely resolved. ? ? ?History of Present Illness  ?12/10/2010 ov/Starla Deller Initial pulmonary office eval in EMR era chronic cough ("all my life", maybe started p started smoking)  some better on dulera x 2 months ,  Tends to be worse during the day and better sleeping,  No am awakening or early exac.  No problems with using voice or swallowing. Takes nexium right before bfast x many years.  Laughing hard can bring it own.only using dulera 200 now.  No purulent sputum over sinus or reflux symptoms ?rec ?GERD diet  ?Increase dulera 200 Take 2 puffs first thing in am and then another 2 puffs about 12 hours later.  ?Work on inhaler technique  ? ? ?09/19/2021  Re-establish ov/Sherma Vanmetre re: cough variant asthma vs UACS   maint on Trelegy / no better on it nor with clariton Laurine Blazer ?Chief Complaint  ?Patient presents with  ? Consult  ?  Patient states that PCP ordered a CT scan that was done 08/29/2021 and told patient that he needed to be seen. Patient states he always has a cough because he has COPD. Patient was tried on 2 different allergy meds but states he did not notice a difference.   ?Dyspnea:  walks slower than others flat surface ok but struggles on hills or faster on flat surface = MMRC2 = can't walk a nl pace on a flat grade s sob but does fine slow and flat ?Cough: to gag/vomit  ?Sleeping: on side, bed is flat 2 pillows  ?SABA use: not using  ?02: none  ?Covid status:  vax x 3  ? ? ?No obvious day to day or daytime variability or assoc excess/ purulent sputum or mucus plugs or hemoptysis or cp or chest tightness, subjective wheeze or overt sinus or hb symptoms.  ? ?Sleeping  without nocturnal  or early am exacerbation  of respiratory  c/o's or need for noct saba. Also denies any obvious fluctuation of symptoms with  weather or environmental changes or other aggravating or alleviating factors except as outlined above  ? ?No unusual exposure hx or h/o childhood pna/ asthma or knowledge of premature birth. ? ?Current Allergies, Complete Past Medical History, Past Surgical History, Family History, and Social History were reviewed in [employer] record. ? ?ROS  The following are not active complaints unless bolded ?Hoarseness, sore throat, dysphagia, dental problems, itching, sneezing,  nasal congestion or discharge of excess mucus or purulent secretions, ear ache,   fever, chills, sweats, unintended wt loss or wt gain, classically pleuritic or exertional cp,  orthopnea pnd or arm/hand swelling  or leg swelling, presyncope, palpitations, abdominal pain, anorexia, nausea, vomiting, diarrhea  or change in bowel habits or change in bladder habits, change in stools or change in urine, dysuria, hematuria,  rash, arthralgias, visual complaints, headache, numbness, weakness or ataxia or problems with walking or coordination,  change in mood or  memory. ?      ? ?Current Meds  ?Medication Sig  ? acetaminophen (TYLENOL) 650 MG CR tablet Take 650 mg by mouth 2 (two) times daily as needed for pain.   ? ALPRAZolam (XANAX) 0.5 MG tablet Take 1-2 tablets (0.5-1 mg total) by mouth at bedtime as needed for anxiety or sleep.  ?  amLODipine (NORVASC) 10 MG tablet TAKE 1 TABLET (10 MG TOTAL) BY MOUTH DAILY BEFORE BREAKFAST. (Patient taking differently: Take 5 mg by mouth daily before breakfast.)  ? aspirin EC 81 MG tablet Take 81 mg by mouth daily.  ? benzonatate (TESSALON) 100 MG capsule TAKE 1-2 CAPSULES TWICE DAILY AS NEEDED FOR COUGH  ? Blood Glucose Monitoring Suppl (ONETOUCH VERIO) w/Device KIT 1 Units by Does not apply route daily as needed.  ? carvedilol (COREG) 25 MG tablet TAKE 1 TABLET BY MOUTH TWICE A DAY WITH MEALS  ? Cholecalciferol (VITAMIN D3) 1000 UNITS tablet Take 2,000 Units by mouth daily.  ? Coenzyme Q10 (CO  Q-10) 200 MG CAPS Take 400 mg by mouth daily.   ? esomeprazole (NEXIUM) 40 MG capsule TAKE 1 CAPSULE (40 MG TOTAL) BY MOUTH DAILY.  ? Fluticasone-Umeclidin-Vilant (TRELEGY ELLIPTA) 100-62.5-25 MCG/ACT AEPB Inhale 1 puff into the lungs daily.  ? Folic Acid 0.8 MG CAPS Take 2 capsules by mouth every morning.   ? glucose blood test strip USE TO TEST BLOOD SUGAR TWICE A DAY  ? hydrOXYzine (ATARAX/VISTARIL) 25 MG tablet TAKE 1-2 TABLETS AT BEDTIME AS NEED FOR INSOMNIA  ? KRILL OIL PO Take by mouth.  ? metFORMIN (GLUCOPHAGE-XR) 750 MG 24 hr tablet TAKE 1 TABLET BY MOUTH EVERY DAY WITH BREAKFAST  ? OneTouch Delica Lancets 38T MISC USE TO CHECK BLOOD SUGAR TWICE DAILY  ? ONETOUCH VERIO test strip USE TO CHECK BLOOD SUGAR DAILY  ? pioglitazone (ACTOS) 45 MG tablet Take 1 tablet (45 mg total) by mouth every other day.  ? traMADol (ULTRAM) 50 MG tablet Take 0.5-1 tablets (25-50 mg total) by mouth every 6 (six) hours as needed (Cough).  ?    ? ?  ? ? ? ? ? ?   ?Objective:  ? Physical Exam ?  ? ?Wt Readings from Last 3 Encounters:  ?09/19/21 202 lb 3.2 oz (91.7 kg)  ?08/21/21 201 lb (91.2 kg)  ?08/20/21 199 lb (90.3 kg)  ? 12/10/10          184  ? ?Vital signs reviewed  09/19/2021  - Note at rest 02 sats  95% on RA  ? ?General appearance:    amb wm nad pot  belly contour  ? ? HEENT : no cobblestoning/ nl turbinates  ? ? ?NECK :  without JVD/Nodes/TM/ nl carotid upstrokes bilaterally ? ? ?LUNGS: no acc muscle use,  Nl contour chest which is clear to A and P bilaterally without cough on insp or exp maneuvers ? ? ?CV:  RRR  no s3  3/6 honking SEM increase in P2, and no edema  ? ?ABD:  obese soft and nontender with nl inspiratory excursion in the supine position. No bruits or organomegaly appreciated, bowel sounds nl ? ?MS:  Nl gait/ ext warm without deformities, calf tenderness, cyanosis or clubbing ?No obvious joint restrictions  ? ?SKIN: warm and dry without lesions   ? ?NEURO:  alert, approp, nl sensorium with  no motor or  cerebellar deficits apparent.  ?  ? ? ? ?I personally reviewed images and agree with radiology impression as follows:  ?Chest CT 08/29/21  ?Mild peripheral interstitial opacity and septal thickening in the ?bilateral lung bases, which is clearly increased over time. Possibly ?some involvement of non dependent portions of the right middle lobe ?and lingula. Although more bland post infectious/postinflammatory ?appearing, mild fibrotic interstitial lung disease is not excluded, ?and if characterized by ATS pulmonary fibrosis criteria, these ?findings are in  an early indeterminate for UIP  ? ?Assessment:  ?   ?  ? ?   ? ? ?

## 2021-09-19 NOTE — Patient Instructions (Addendum)
Symbicort 80  Take 2 puffs first thing in am and then another 2 puffs about 12 hours later.  ? ?Nexium 40 mg Take 30-60 min before first meal of the day  ? ? ?Stop corvedilol  and start bisoprol 5 mg twice daily  ? ? ?GERD (REFLUX)  is an extremely common cause of respiratory symptoms just like yours , many times with no obvious heartburn at all.  ? ? It can be treated with medication, but also with lifestyle changes including elevation of the head of your bed (ideally with 6 -8inch blocks under the headboard of your bed),  Smoking cessation, avoidance of late meals, excessive alcohol, and avoid fatty foods, chocolate, peppermint, colas, red wine, and acidic juices such as orange juice.  ?NO MINT OR MENTHOL PRODUCTS SO NO COUGH DROPS  ?USE SUGARLESS CANDY INSTEAD (Jolley ranchers or Stover's or Life Savers) or even ice chips will also do - the key is to swallow to prevent all throat clearing. ?NO OIL BASED VITAMINS - use powdered substitutes.  Avoid fish oil when coughing.  ? ?We will schedule Echocardiogram and call you with results  ? ?Please schedule a follow up office visit in 6 weeks, call sooner if needed  ?

## 2021-09-19 NOTE — Assessment & Plan Note (Addendum)
See CT chest  08/29/21  Mild PF not excluded (note this was post Covid 19 infection, though relatively mild  ?- repeat CT s contrast rec 11/28/21 >>> ? ?Since this is post covid likley inflammatory so wait 3 months to repeat ? ?Discussed in detail all the  indications, usual  risks and alternatives  relative to the benefits with patient who agrees to proceed with w/u as outlined.  ? ?    ?  ? ?Each maintenance medication was reviewed in detail including emphasizing most importantly the difference between maintenance and prns and under what circumstances the prns are to be triggered using an action plan format where appropriate. ? ?Total time for H and P, chart review, counseling,  and generating customized AVS unique to this office visit / same day charting  > 60 min on unrelated topics ?       ? ? ?

## 2021-09-19 NOTE — Assessment & Plan Note (Signed)
Repeat Echo needed as last one done in 2017 and is now 2-3 / 6 with doe not explained by prior pfts  ? ?Discussed in detail all the  indications, usual  risks and alternatives  relative to the benefits with patient who agrees to proceed with w/u as outlined.    ? ?    ?  ? ?Each maintenance medication was reviewed in detail including emphasizing most importantly the difference between maintenance and prns and under what circumstances the prns are to be triggered using an action plan format where appropriate. ? ?Total time for H and P, chart review, counseling, reviewing hfa device(s) and generating customized AVS unique to this office visit / same day charting > 45 min new pt ?     ?

## 2021-09-19 NOTE — Assessment & Plan Note (Addendum)
Try change coreg to bisoprolol 5 mg bid  ? ?Commnt: : in the setting of respiratory symptoms of unknown etiology,  It would be preferable to use bystolic, the most beta -1  selective Beta blocker available in sample form, with bisoprolol the most selective generic choice  on the market, at least on a trial basis, to make sure the spillover Beta 2 effects of the less specific Beta blockers are not contributing to this patient's symptoms.  ?

## 2021-10-02 ENCOUNTER — Ambulatory Visit: Payer: PPO | Admitting: Internal Medicine

## 2021-10-08 ENCOUNTER — Ambulatory Visit (HOSPITAL_COMMUNITY): Payer: PPO | Attending: Cardiovascular Disease

## 2021-10-08 DIAGNOSIS — R011 Cardiac murmur, unspecified: Secondary | ICD-10-CM | POA: Diagnosis not present

## 2021-10-08 LAB — ECHOCARDIOGRAM COMPLETE
AR max vel: 1.1 cm2
AV Area VTI: 1.07 cm2
AV Area mean vel: 1.12 cm2
AV Mean grad: 12 mmHg
AV Peak grad: 25.4 mmHg
Ao pk vel: 2.52 m/s
Area-P 1/2: 4.26 cm2
S' Lateral: 3.2 cm

## 2021-10-09 ENCOUNTER — Encounter: Payer: Self-pay | Admitting: Internal Medicine

## 2021-10-09 ENCOUNTER — Telehealth: Payer: Self-pay | Admitting: Internal Medicine

## 2021-10-09 ENCOUNTER — Telehealth: Payer: Self-pay | Admitting: Cardiovascular Disease

## 2021-10-09 DIAGNOSIS — I350 Nonrheumatic aortic (valve) stenosis: Secondary | ICD-10-CM | POA: Insufficient documentation

## 2021-10-09 NOTE — Telephone Encounter (Signed)
Patient called to see if Dr. Sallyanne Kuster or nurse to read out what his results was. Please call back to discuss ?

## 2021-10-09 NOTE — Telephone Encounter (Signed)
Pt calling to follow up with Dr. Sallyanne Kuster regarding recent echocardiogram done by Dr. Melvyn Novas. Per Dr. Melvyn Novas pt needs to re-establish care with Dr. Sallyanne Kuster. Advised pt to reach out to Dr. Gustavus Bryant office to send over a referral, pt has not been seen by Dr. Sallyanne Kuster since 2018. Explained to pt that once referral is placed an appointment with Dr. Loletha Grayer can be made. Pt verbalizes understanding.  ?

## 2021-10-10 NOTE — Telephone Encounter (Signed)
Called and explained a little bit more for the patient regarding his cardiac echo. Pt is going to see PCP and will ask about a cardiac doctor to follow up care with. Nothing further needed at this time  ?

## 2021-10-11 ENCOUNTER — Telehealth: Payer: Self-pay | Admitting: Internal Medicine

## 2021-10-11 NOTE — Progress Notes (Signed)
Called pt and there was no answer-LMTCB °

## 2021-10-12 NOTE — Telephone Encounter (Signed)
I called the patient back and left a message.

## 2021-10-12 NOTE — Telephone Encounter (Signed)
Patient is returning phone call. Patient phone number is [phone].

## 2021-10-13 ENCOUNTER — Other Ambulatory Visit: Payer: Self-pay | Admitting: Internal Medicine

## 2021-10-17 ENCOUNTER — Ambulatory Visit (INDEPENDENT_AMBULATORY_CARE_PROVIDER_SITE_OTHER): Payer: PPO | Admitting: Internal Medicine

## 2021-10-17 ENCOUNTER — Encounter: Payer: Self-pay | Admitting: Internal Medicine

## 2021-10-17 VITALS — BP 142/88 | HR 60 | Temp 97.7°F | Ht 64.0 in | Wt 198.8 lb

## 2021-10-17 DIAGNOSIS — J45991 Cough variant asthma: Secondary | ICD-10-CM

## 2021-10-17 DIAGNOSIS — M5440 Lumbago with sciatica, unspecified side: Secondary | ICD-10-CM | POA: Diagnosis not present

## 2021-10-17 DIAGNOSIS — E119 Type 2 diabetes mellitus without complications: Secondary | ICD-10-CM | POA: Diagnosis not present

## 2021-10-17 DIAGNOSIS — G8929 Other chronic pain: Secondary | ICD-10-CM | POA: Diagnosis not present

## 2021-10-17 DIAGNOSIS — D751 Secondary polycythemia: Secondary | ICD-10-CM

## 2021-10-17 DIAGNOSIS — K219 Gastro-esophageal reflux disease without esophagitis: Secondary | ICD-10-CM | POA: Diagnosis not present

## 2021-10-17 LAB — COMPREHENSIVE METABOLIC PANEL
ALT: 25 U/L (ref 0–53)
AST: 27 U/L (ref 0–37)
Albumin: 4.2 g/dL (ref 3.5–5.2)
Alkaline Phosphatase: 94 U/L (ref 39–117)
BUN: 12 mg/dL (ref 6–23)
CO2: 31 mEq/L (ref 19–32)
Calcium: 9.5 mg/dL (ref 8.4–10.5)
Chloride: 99 mEq/L (ref 96–112)
Creatinine, Ser: 0.84 mg/dL (ref 0.40–1.50)
GFR: 84.16 mL/min (ref >60.00)
Glucose, Bld: 137 mg/dL — ABNORMAL HIGH (ref 70–99)
Potassium: 4.2 mEq/L (ref 3.5–5.1)
Sodium: 138 mEq/L (ref 135–145)
Total Bilirubin: 0.7 mg/dL (ref 0.2–1.2)
Total Protein: 7.3 g/dL (ref 6.0–8.3)

## 2021-10-17 LAB — CBC WITH DIFFERENTIAL/PLATELET
Basophils Absolute: 0.1 10*3/uL (ref 0.0–0.1)
Basophils Relative: 1.1 % (ref 0.0–3.0)
Eosinophils Absolute: 0.3 10*3/uL (ref 0.0–0.7)
Eosinophils Relative: 4.3 % (ref 0.0–5.0)
HCT: 50.5 % (ref 39.0–52.0)
Hemoglobin: 16.9 g/dL (ref 13.0–17.0)
Lymphocytes Relative: 23.2 % (ref 12.0–46.0)
Lymphs Abs: 1.4 10*3/uL (ref 0.7–4.0)
MCHC: 33.4 g/dL (ref 30.0–36.0)
MCV: 93.8 fl (ref 78.0–100.0)
Monocytes Absolute: 0.7 10*3/uL (ref 0.1–1.0)
Monocytes Relative: 11.5 % (ref 3.0–12.0)
Neutro Abs: 3.7 10*3/uL (ref 1.4–7.7)
Neutrophils Relative %: 59.9 % (ref 43.0–77.0)
Platelets: 223 10*3/uL (ref 150.0–400.0)
RBC: 5.39 Mil/uL (ref 4.22–5.81)
RDW: 14.9 % (ref 11.5–15.5)
WBC: 6.2 10*3/uL (ref 4.0–10.5)

## 2021-10-17 LAB — HEMOGLOBIN A1C: Hgb A1c MFr Bld: 7.2 % — ABNORMAL HIGH (ref 4.6–6.5)

## 2021-10-17 MED ORDER — LORATADINE 10 MG PO TABS: 10.0000 mg | ORAL_TABLET | Freq: Every day | ORAL | 11 refills | Status: DC

## 2021-10-17 NOTE — Assessment & Plan Note (Addendum)
He continues to have daytime cough - sunroom only. Take Claritin qd F/u w/Dr Melvyn Novas

## 2021-10-17 NOTE — Assessment & Plan Note (Signed)
Check CBC 

## 2021-10-17 NOTE — Assessment & Plan Note (Signed)
Continue on Metfomin XR, Actos 

## 2021-10-17 NOTE — Assessment & Plan Note (Signed)
Cont w/Nexium

## 2021-10-17 NOTE — Progress Notes (Signed)
Subjective:  Patient ID: Chase Mage., male    DOB: 08-13-1945  Age: 76 y.o. MRN: [id number]  CC: Follow-up   HPI [name]. presents for DM, CAD, HTN He continues to have daytime cough - sunroom only  Outpatient Medications Prior to Visit  Medication Sig Dispense Refill   acetaminophen (TYLENOL) 650 MG CR tablet Take 650 mg by mouth 2 (two) times daily as needed for pain.      ALPRAZolam (XANAX) 0.5 MG tablet Take 1-2 tablets (0.5-1 mg total) by mouth at bedtime as needed for anxiety or sleep. 180 tablet 1   amLODipine (NORVASC) 10 MG tablet TAKE 1 TABLET (10 MG TOTAL) BY MOUTH DAILY BEFORE BREAKFAST. (Patient taking differently: Take 5 mg by mouth daily before breakfast.) 90 tablet 3   aspirin EC 81 MG tablet Take 81 mg by mouth daily.     benzonatate (TESSALON) 100 MG capsule TAKE 1-2 CAPSULES TWICE DAILY AS NEEDED FOR COUGH 30 capsule 1   bisoprolol (ZEBETA) 5 MG tablet One twice daily 60 tablet 11   Blood Glucose Monitoring Suppl (ONETOUCH VERIO) w/Device KIT 1 Units by Does not apply route daily as needed. 1 kit 1   budesonide-formoterol (SYMBICORT) 80-4.5 MCG/ACT inhaler Take 2 puffs first thing in am and then another 2 puffs about 12 hours later. 1 each 12   Cholecalciferol (VITAMIN D3) 1000 UNITS tablet Take 2,000 Units by mouth daily.     Coenzyme Q10 (CO Q-10) 200 MG CAPS Take 400 mg by mouth daily.      esomeprazole (NEXIUM) 40 MG capsule TAKE 1 CAPSULE (40 MG TOTAL) BY MOUTH DAILY. 90 capsule 3   Folic Acid 0.8 MG CAPS Take 2 capsules by mouth every morning.  30 each 0   glucose blood test strip USE TO TEST BLOOD SUGAR TWICE A DAY 100 each 3   hydrOXYzine (ATARAX/VISTARIL) 25 MG tablet TAKE 1-2 TABLETS AT BEDTIME AS NEED FOR INSOMNIA 180 tablet 1   metFORMIN (GLUCOPHAGE-XR) 750 MG 24 hr tablet TAKE 1 TABLET BY MOUTH EVERY DAY WITH BREAKFAST 90 tablet 1   OneTouch Delica Lancets 01S MISC USE TO CHECK BLOOD SUGAR TWICE DAILY 100 each 3   ONETOUCH VERIO test  strip USE TO CHECK BLOOD SUGAR DAILY 100 strip 5   pioglitazone (ACTOS) 45 MG tablet Take 1 tablet (45 mg total) by mouth every other day. (Patient taking differently: Take 45 mg by mouth every other day. Paatient only takes 1/2every other day) 90 tablet 3   traMADol (ULTRAM) 50 MG tablet Take 0.5-1 tablets (25-50 mg total) by mouth every 6 (six) hours as needed (Cough). 20 tablet 1   No facility-administered medications prior to visit.    ROS: Review of Systems  Constitutional:  Negative for appetite change, fatigue and unexpected weight change.  HENT:  Negative for congestion, nosebleeds, sneezing, sore throat and trouble swallowing.   Eyes:  Negative for itching and visual disturbance.  Respiratory:  Positive for cough.   Cardiovascular:  Negative for chest pain, palpitations and leg swelling.  Gastrointestinal:  Negative for abdominal distention, blood in stool, diarrhea and nausea.  Genitourinary:  Negative for frequency and hematuria.  Musculoskeletal:  Positive for back pain. Negative for gait problem, joint swelling and neck pain.  Skin:  Negative for rash.  Neurological:  Negative for dizziness, tremors, speech difficulty and weakness.  Psychiatric/Behavioral:  Negative for agitation, dysphoric mood and sleep disturbance. The patient is not nervous/anxious.    Objective:  BP (!) 142/88 (BP Location: Left Arm, Patient Position: Sitting, Cuff Size: Normal)   Pulse 60   Temp 97.7 F (36.5 C) (Oral)   Ht 5' 4" (1.626 m)   Wt 198 lb 12.8 oz (90.2 kg)   SpO2 92%   BMI 34.12 kg/m   BP Readings from Last 3 Encounters:  10/17/21 (!) 142/88  09/19/21 118/70  08/21/21 132/84    Wt Readings from Last 3 Encounters:  10/17/21 198 lb 12.8 oz (90.2 kg)  09/19/21 202 lb 3.2 oz (91.7 kg)  08/21/21 201 lb (91.2 kg)    Physical Exam Constitutional:      General: He is not in acute distress.    Appearance: He is well-developed. He is obese.     Comments: NAD  Eyes:      Conjunctiva/sclera: Conjunctivae normal.     Pupils: Pupils are equal, round, and reactive to light.  Neck:     Thyroid: No thyromegaly.     Vascular: No JVD.  Cardiovascular:     Rate and Rhythm: Normal rate and regular rhythm.     Heart sounds: Normal heart sounds. No murmur heard.   No friction rub. No gallop.  Pulmonary:     Effort: Pulmonary effort is normal. No respiratory distress.     Breath sounds: Normal breath sounds. No wheezing or rales.  Chest:     Chest wall: No tenderness.  Abdominal:     General: Bowel sounds are normal. There is no distension.     Palpations: Abdomen is soft. There is no mass.     Tenderness: There is no abdominal tenderness. There is no guarding or rebound.  Musculoskeletal:        General: No tenderness. Normal range of motion.     Cervical back: Normal range of motion.  Lymphadenopathy:     Cervical: No cervical adenopathy.  Skin:    General: Skin is warm and dry.     Findings: No rash.  Neurological:     Mental Status: He is alert and oriented to person, place, and time.     Cranial Nerves: No cranial nerve deficit.     Motor: No abnormal muscle tone.     Coordination: Coordination normal.     Gait: Gait normal.     Deep Tendon Reflexes: Reflexes are normal and symmetric.  Psychiatric:        Behavior: Behavior normal.        Thought Content: Thought content normal.        Judgment: Judgment normal.    Lab Results  Component Value Date   WBC 5.9 10/19/2020   HGB 16.4 10/19/2020   HCT 48.2 10/19/2020   PLT 226.0 10/19/2020   GLUCOSE 142 (H) 08/20/2021   CHOL 216 (H) 10/19/2020   TRIG 94.0 10/19/2020   HDL 81.50 10/19/2020   LDLCALC 115 (H) 10/19/2020   ALT 27 08/20/2021   AST 27 08/20/2021   NA 137 08/20/2021   K 4.3 08/20/2021   CL 100 08/20/2021   CREATININE 0.87 08/20/2021   BUN 15 08/20/2021   CO2 30 08/20/2021   TSH 3.09 10/19/2020   PSA 1.13 10/19/2020   HGBA1C 7.3 (H) 08/20/2021   MICROALBUR 1.9 06/21/2020     CT Chest Wo Contrast  Result Date: 08/30/2021 CLINICAL DATA:  Chronic productive cough, history of COPD, asthma EXAM: CT CHEST WITHOUT CONTRAST TECHNIQUE: Multidetector CT imaging of the chest was performed following the standard protocol without IV contrast. RADIATION DOSE REDUCTION:   This exam was performed according to the departmental dose-optimization program which includes automated exposure control, adjustment of the mA and/or kV according to patient size and/or use of iterative reconstruction technique. COMPARISON:  12/13/2019 FINDINGS: Cardiovascular: Aortic atherosclerosis. Aortic valve calcifications. Mild cardiomegaly. Three-vessel coronary artery calcifications. No pericardial effusion. Mediastinum/Nodes: No enlarged mediastinal, hilar, or axillary lymph nodes. Thyroid gland, trachea, and esophagus demonstrate no significant findings. Lungs/Pleura: Mild centrilobular and paraseptal emphysema. Mild peripheral interstitial opacity and septal thickening in the bilateral lung bases, which is clearly increased over time (series 3, image 88). Possibly some involvement of non dependent portions of the right middle lobe and lingula. New subpleural nodular opacity of the dependent right lung base measuring 1.0 cm (series 3, image 84). Additional stable, definitively benign small pulmonary nodules measuring 0.3 cm and smaller. No pleural effusion or pneumothorax. Upper Abdomen: No acute abnormality. Musculoskeletal: No chest wall abnormality. No suspicious osseous lesions identified. IMPRESSION: 1. Mild peripheral interstitial opacity and septal thickening in the bilateral lung bases, which is clearly increased over time. Possibly some involvement of non dependent portions of the right middle lobe and lingula. Although more bland post infectious/postinflammatory appearing, mild fibrotic interstitial lung disease is not excluded, and if characterized by ATS pulmonary fibrosis criteria, these findings are in  an early indeterminate for UIP pattern per consensus guidelines: Diagnosis of Idiopathic Pulmonary Fibrosis: An Official ATS/ERS/JRS/ALAT Clinical Practice Guideline. [place], Iss 5, [phone], Jan 25 2017. 2. New subpleural nodular opacity of the dependent right lung base measuring 1.0 cm, most likely infectious or inflammatory, however recommend follow-up CT in 3 months to assess for stability or resolution. 3. Mild emphysema. 4. Coronary artery disease. 5. Aortic valve calcifications. Correlate for echocardiographic evidence of aortic valve dysfunction. Aortic Atherosclerosis (ICD10-I70.0) and Emphysema (ICD10-J43.9). Electronically Signed   By: Delanna Ahmadi M.D.   On: 08/30/2021 08:34    Assessment & Plan:   Problem List Items Addressed This Visit     GERD    Cont w/Nexium       Cough variant asthma vs upper airway cough syndrome     He continues to have daytime cough - sunroom only. Take Claritin qd F/u w/Dr Melvyn Novas      Diabetes type 2, controlled ([place]) - Primary    Continue on Metfomin XR, Actos      Relevant Orders   CBC with Differential/Platelet   Comprehensive metabolic panel   Hemoglobin A1c   Low back pain    Off and on pain       Polycythemia    Check CBC       Relevant Orders   CBC with Differential/Platelet      Meds ordered this encounter  Medications   loratadine (CLARITIN) 10 MG tablet    Sig: Take 1 tablet (10 mg total) by mouth daily.    Dispense:  30 tablet    Refill:  11      Follow-up: Return in about 3 months (around 01/17/2022) for a follow-up visit.  [name], MD

## 2021-10-17 NOTE — Assessment & Plan Note (Signed)
Off and on pain

## 2021-10-31 ENCOUNTER — Ambulatory Visit: Payer: PPO | Admitting: Internal Medicine

## 2021-10-31 ENCOUNTER — Encounter: Payer: Self-pay | Admitting: Internal Medicine

## 2021-10-31 DIAGNOSIS — R9389 Abnormal findings on diagnostic imaging of other specified body structures: Secondary | ICD-10-CM

## 2021-10-31 DIAGNOSIS — J45991 Cough variant asthma: Secondary | ICD-10-CM | POA: Diagnosis not present

## 2021-10-31 NOTE — Progress Notes (Signed)
Subjective:     Patient ID: [name], male   DOB: 05/31/1945,    MRN: [id number]    Brief patient profile:  75   yowm quit smoking 2003 with some coughing that improved p quit but never completely resolved.   History of Present Illness  12/10/2010 ov/Wert Initial pulmonary office eval in EMR era chronic cough ("all my life", maybe started p started smoking)  some better on dulera x 2 months ,  Tends to be worse during the day and better sleeping,  No am awakening or early exac.  No problems with using voice or swallowing. Takes nexium right before bfast x many years.  Laughing hard can bring it own.only using dulera 200 now.  No purulent sputum over sinus or reflux symptoms rec GERD diet  Increase dulera 200 Take 2 puffs first thing in am and then another 2 puffs about 12 hours later.  Work on inhaler technique    09/19/2021  Re-establish ov/Wert re: cough variant asthma vs UACS   maint on Trelegy / no better on it nor with clariton /singulair/ abn ct post covid  Chief Complaint  Patient presents with   Consult    Patient states that PCP ordered a CT scan that was done 08/29/2021 and told patient that he needed to be seen. Patient states he always has a cough because he has COPD. Patient was tried on 2 different allergy meds but states he did not notice a difference.   Dyspnea:  walks slower than others flat surface ok but struggles on hills or faster on flat surface = MMRC2 = can't walk a nl pace on a flat grade s sob but does fine slow and flat Cough: to gag/vomit  Sleeping: on side, bed is flat 2 pillows  SABA use: not using  02: none  Covid status:  vax x 3  Rec Symbicort 80  Take 2 puffs first thing in am and then another 2 puffs about 12 hours later.  Nexium 40 mg Take 30-60 min before first meal of the day  Stop corvedilol  and start bisoprol 5 mg twice daily  GERD diet reviewed, bed blocks rec  We will schedule Echocardiogram > mild/mod AS      10/31/2021  f/u ov/Wert  re: mild/mod AS  maint on symbicort 80   Chief Complaint  Patient presents with   Follow-up    Pt states he has been doing okay since last visit and denies any complaints.   Dyspnea:  no change  Cough: daytime / no longer gagging off trelegy  Sleeping: falt bed 2 pillows  SABA use: not using  02: none  Covid status:   vax x 3 / one vax    No obvious day to day or daytime variability or assoc excess/ purulent sputum or mucus plugs or hemoptysis or cp or chest tightness, subjective wheeze or overt sinus or hb symptoms.   sleeping without nocturnal  or early am exacerbation  of respiratory  c/o's or need for noct saba. Also denies any obvious fluctuation of symptoms with weather or environmental changes or other aggravating or alleviating factors except as outlined above   No unusual exposure hx or h/o childhood pna/ asthma or knowledge of premature birth.  Current Allergies, Complete Past Medical History, Past Surgical History, Family History, and Social History were reviewed in [facility] electronic medical record.  ROS  The following are not active complaints unless bolded Hoarseness, sore throat, dysphagia, dental problems, itching,   sneezing,  nasal congestion or discharge of excess mucus or purulent secretions, ear ache,   fever, chills, sweats, unintended wt loss or wt gain, classically pleuritic or exertional cp,  orthopnea pnd or arm/hand swelling  or leg swelling, presyncope, palpitations, abdominal pain, anorexia, nausea, vomiting, diarrhea  or change in bowel habits or change in bladder habits, change in stools or change in urine, dysuria, hematuria,  rash, arthralgias, visual complaints, headache, numbness, weakness or ataxia or problems with walking or coordination,  change in mood or  memory.        Current Meds  Medication Sig   acetaminophen (TYLENOL) 650 MG CR tablet Take 650 mg by mouth 2 (two) times daily as needed for pain.    ALPRAZolam (XANAX) 0.5 MG tablet Take  1-2 tablets (0.5-1 mg total) by mouth at bedtime as needed for anxiety or sleep.   amLODipine (NORVASC) 10 MG tablet TAKE 1 TABLET (10 MG TOTAL) BY MOUTH DAILY BEFORE BREAKFAST. (Patient taking differently: Take 5 mg by mouth daily before breakfast.)   aspirin EC 81 MG tablet Take 81 mg by mouth daily.   benzonatate (TESSALON) 100 MG capsule TAKE 1-2 CAPSULES TWICE DAILY AS NEEDED FOR COUGH   bisoprolol (ZEBETA) 5 MG tablet One twice daily   Blood Glucose Monitoring Suppl (ONETOUCH VERIO) w/Device KIT 1 Units by Does not apply route daily as needed.   budesonide-formoterol (SYMBICORT) 80-4.5 MCG/ACT inhaler Take 2 puffs first thing in am and then another 2 puffs about 12 hours later.   Cholecalciferol (VITAMIN D3) 1000 UNITS tablet Take 2,000 Units by mouth daily.   Coenzyme Q10 (CO Q-10) 200 MG CAPS Take 400 mg by mouth daily.    esomeprazole (NEXIUM) 40 MG capsule TAKE 1 CAPSULE (40 MG TOTAL) BY MOUTH DAILY.   Folic Acid 0.8 MG CAPS Take 2 capsules by mouth every morning.    glucose blood test strip USE TO TEST BLOOD SUGAR TWICE A DAY   hydrOXYzine (ATARAX/VISTARIL) 25 MG tablet TAKE 1-2 TABLETS AT BEDTIME AS NEED FOR INSOMNIA   metFORMIN (GLUCOPHAGE-XR) 750 MG 24 hr tablet TAKE 1 TABLET BY MOUTH EVERY DAY WITH BREAKFAST   OneTouch Delica Lancets 33G MISC USE TO CHECK BLOOD SUGAR TWICE DAILY   ONETOUCH VERIO test strip USE TO CHECK BLOOD SUGAR DAILY   pioglitazone (ACTOS) 45 MG tablet Take 1 tablet (45 mg total) by mouth every other day. (Patient taking differently: Take 45 mg by mouth every other day. Paatient only takes 1/2every other day)   traMADol (ULTRAM) 50 MG tablet Take 0.5-1 tablets (25-50 mg total) by mouth every 6 (six) hours as needed (Cough).   [DISCONTINUED] loratadine (CLARITIN) 10 MG tablet Take 1 tablet (10 mg total) by mouth daily.               Objective:   Physical Exam   wts  10/31/2021         200   09/19/21 202 lb 3.2 oz (91.7 kg)  08/21/21 201 lb (91.2 kg)   08/20/21 199 lb (90.3 kg)   12/10/10          184   Vital signs reviewed  10/31/2021  - Note at rest 02 sats  98% on RA   General appearance:    amb mod obese wm nad    HEENT : Oropharynx  clear      Nasal turbinates nl    NECK :  without  apparent JVD/ palpable Nodes/TM    LUNGS: no   acc muscle use,  Nl contour chest which is clear to A and P bilaterally without cough on insp or exp maneuvers   CV:  RRR  no s3 or 2-3/6 sem s increase in P2, and no edema   ABD:  soft and nontender with nl inspiratory excursion in the supine position. No bruits or organomegaly appreciated   MS:  Nl gait/ ext warm without deformities Or obvious joint restrictions  calf tenderness, cyanosis or clubbing    SKIN: warm and dry without lesions    NEURO:  alert, approp, nl sensorium with  no motor or cerebellar deficits apparent.     Assessment:             

## 2021-10-31 NOTE — Patient Instructions (Addendum)
No change in medications   Take delsym two tsp every 12 hours  as needed. Swallowing water and/or using ice chips/non mint and menthol containing candies (such as lifesavers or sugarless jolly ranchers) are also effective.        Please schedule a follow up visit in 6  months but call sooner if needed

## 2021-11-01 ENCOUNTER — Encounter: Payer: Self-pay | Admitting: Internal Medicine

## 2021-11-01 ENCOUNTER — Telehealth: Payer: Self-pay | Admitting: Internal Medicine

## 2021-11-01 DIAGNOSIS — R911 Solitary pulmonary nodule: Secondary | ICD-10-CM

## 2021-11-01 NOTE — Telephone Encounter (Signed)
Typo on his AVS,  I did not mean to rec tramadol for the cough in his case and did not give him rx    Chart review indicated a f/u ct needed in July and it's already in the reminder file

## 2021-11-01 NOTE — Assessment & Plan Note (Signed)
See CT chest  08/29/21  Mild PF not excluded (note this was post Covid 19 infection, though relatively mild  - repeat CT s contrast rec 11/28/21 >>   likely post covid PF/ note absence of cough on deep insp so not likely the cause of his daytime cough but need to close the loop with recs as above  Discussed in detail all the  indications, usual  risks and alternatives  relative to the benefits with patient who agrees to proceed with w/u as outlined.             Each maintenance medication was reviewed in detail including emphasizing most importantly the difference between maintenance and prns and under what circumstances the prns are to be triggered using an action plan format where appropriate.  Total time for H and P, chart review, counseling, reviewing hfa device(s) and generating customized AVS unique to this office visit / same day charting = 23 min

## 2021-11-01 NOTE — Telephone Encounter (Signed)
Did you mean that you did mean to rec the tramadol? Please clarify. Thanks!

## 2021-11-01 NOTE — Assessment & Plan Note (Signed)
Quit smoking 2003 - onset was in childhood, worse when smoking  - seen 2012 rec dulera and did not return to clinic - 09/19/2021 rec trial of symbicort 80 2bid and max gerd rx/ change coreg to bisoprolol   Better off trelegy ? If change to bisoprol made any difference but no better on clariton so far  Re Continue low dose symbicort which is the least likely to irritate the upper airway Add delsym/ hard rock candy and continue gerd rx  If not better could consider gabapentin next

## 2021-11-02 NOTE — Telephone Encounter (Signed)
Spoke with the pt and notified of recs per MW. CT ordered. Nothing further needed.

## 2021-11-05 ENCOUNTER — Other Ambulatory Visit: Payer: Self-pay | Admitting: Internal Medicine

## 2021-11-05 NOTE — Telephone Encounter (Signed)
Check [place] registry last filled 07/03/2021../l,mb

## 2021-11-07 ENCOUNTER — Encounter: Payer: PPO | Admitting: Internal Medicine

## 2021-11-19 ENCOUNTER — Other Ambulatory Visit: Payer: Self-pay | Admitting: Internal Medicine

## 2021-11-21 NOTE — Telephone Encounter (Signed)
Since pt last called the office, pt has been seen by Dr. Melvyn Novas. Closing encounter.

## 2021-11-29 ENCOUNTER — Ambulatory Visit (INDEPENDENT_AMBULATORY_CARE_PROVIDER_SITE_OTHER): Payer: PPO | Admitting: Internal Medicine

## 2021-11-29 ENCOUNTER — Encounter: Payer: Self-pay | Admitting: Internal Medicine

## 2021-11-29 VITALS — BP 130/82 | HR 59 | Temp 98.2°F | Ht 64.0 in | Wt 199.0 lb

## 2021-11-29 DIAGNOSIS — Z23 Encounter for immunization: Secondary | ICD-10-CM | POA: Diagnosis not present

## 2021-11-29 DIAGNOSIS — E119 Type 2 diabetes mellitus without complications: Secondary | ICD-10-CM

## 2021-11-29 DIAGNOSIS — Z0000 Encounter for general adult medical examination without abnormal findings: Secondary | ICD-10-CM

## 2021-11-29 DIAGNOSIS — Z1211 Encounter for screening for malignant neoplasm of colon: Secondary | ICD-10-CM

## 2021-11-29 DIAGNOSIS — E785 Hyperlipidemia, unspecified: Secondary | ICD-10-CM

## 2021-11-29 DIAGNOSIS — D751 Secondary polycythemia: Secondary | ICD-10-CM

## 2021-11-29 LAB — URINALYSIS
Bilirubin Urine: NEGATIVE
Hgb urine dipstick: NEGATIVE
Ketones, ur: NEGATIVE
Leukocytes,Ua: NEGATIVE
Nitrite: NEGATIVE
Specific Gravity, Urine: 1.015 (ref 1.000–1.030)
Urine Glucose: NEGATIVE
Urobilinogen, UA: 0.2 (ref 0.0–1.0)
pH: 7 (ref 5.0–8.0)

## 2021-11-29 LAB — COMPREHENSIVE METABOLIC PANEL
ALT: 28 U/L (ref 0–53)
AST: 27 U/L (ref 0–37)
Albumin: 4.2 g/dL (ref 3.5–5.2)
Alkaline Phosphatase: 93 U/L (ref 39–117)
BUN: 16 mg/dL (ref 6–23)
CO2: 30 mEq/L (ref 19–32)
Calcium: 9.3 mg/dL (ref 8.4–10.5)
Chloride: 100 mEq/L (ref 96–112)
Creatinine, Ser: 0.81 mg/dL (ref 0.40–1.50)
GFR: 85.02 mL/min (ref >60.00)
Glucose, Bld: 126 mg/dL — ABNORMAL HIGH (ref 70–99)
Potassium: 4.3 mEq/L (ref 3.5–5.1)
Sodium: 137 mEq/L (ref 135–145)
Total Bilirubin: 0.6 mg/dL (ref 0.2–1.2)
Total Protein: 7.2 g/dL (ref 6.0–8.3)

## 2021-11-29 LAB — CBC WITH DIFFERENTIAL/PLATELET
Basophils Absolute: 0.1 10*3/uL (ref 0.0–0.1)
Basophils Relative: 0.8 % (ref 0.0–3.0)
Eosinophils Absolute: 0.3 10*3/uL (ref 0.0–0.7)
Eosinophils Relative: 3.8 % (ref 0.0–5.0)
HCT: 50.6 % (ref 39.0–52.0)
Hemoglobin: 16.9 g/dL (ref 13.0–17.0)
Lymphocytes Relative: 23.7 % (ref 12.0–46.0)
Lymphs Abs: 1.6 10*3/uL (ref 0.7–4.0)
MCHC: 33.4 g/dL (ref 30.0–36.0)
MCV: 92.6 fl (ref 78.0–100.0)
Monocytes Absolute: 0.7 10*3/uL (ref 0.1–1.0)
Monocytes Relative: 10.4 % (ref 3.0–12.0)
Neutro Abs: 4.1 10*3/uL (ref 1.4–7.7)
Neutrophils Relative %: 61.3 % (ref 43.0–77.0)
Platelets: 246 10*3/uL (ref 150.0–400.0)
RBC: 5.46 Mil/uL (ref 4.22–5.81)
RDW: 14.7 % (ref 11.5–15.5)
WBC: 6.7 10*3/uL (ref 4.0–10.5)

## 2021-11-29 LAB — LIPID PANEL
Cholesterol: 207 mg/dL — ABNORMAL HIGH (ref 0–200)
HDL: 85 mg/dL (ref >39.00)
LDL Cholesterol: 106 mg/dL — ABNORMAL HIGH (ref 0–99)
NonHDL: 121.7
Total CHOL/HDL Ratio: 2
Triglycerides: 81 mg/dL (ref 0.0–149.0)
VLDL: 16.2 mg/dL (ref 0.0–40.0)

## 2021-11-29 LAB — HEMOGLOBIN A1C: Hgb A1c MFr Bld: 7.4 % — ABNORMAL HIGH (ref 4.6–6.5)

## 2021-11-29 LAB — TSH: TSH: 1.97 u[IU]/mL (ref 0.35–5.50)

## 2021-11-29 NOTE — Assessment & Plan Note (Signed)
CBC

## 2021-11-29 NOTE — Assessment & Plan Note (Signed)
trial of Pitavastatin, Vascepa 

## 2021-11-29 NOTE — Addendum Note (Signed)
Addended by: Marijean Heath R on: 11/29/2021 04:28 PM   Modules accepted: Orders

## 2021-11-29 NOTE — Assessment & Plan Note (Signed)

## 2021-11-29 NOTE — Assessment & Plan Note (Signed)
Continue on Metfomin XR, Actos 

## 2021-11-29 NOTE — Progress Notes (Signed)
Subjective:  Patient ID: Chase Mage., male    DOB: [birth year]  Age: 76 y.o. MRN: [id number]  CC: No chief complaint on file.   HPI [name]. presents for a well exam  Outpatient Medications Prior to Visit  Medication Sig Dispense Refill   acetaminophen (TYLENOL) 650 MG CR tablet Take 650 mg by mouth 2 (two) times daily as needed for pain.      ALPRAZolam (XANAX) 0.5 MG tablet Take 1-2 tablets (0.5-1 mg total) by mouth at bedtime as needed for anxiety or sleep. 180 tablet 1   amLODipine (NORVASC) 10 MG tablet TAKE 1 TABLET BY MOUTH DAILY BEFORE BREAKFAST. 90 tablet 3   aspirin EC 81 MG tablet Take 81 mg by mouth daily.     benzonatate (TESSALON) 100 MG capsule TAKE 1-2 CAPSULES TWICE DAILY AS NEEDED FOR COUGH 30 capsule 1   bisoprolol (ZEBETA) 5 MG tablet One twice daily 60 tablet 11   Blood Glucose Monitoring Suppl (ONETOUCH VERIO) w/Device KIT 1 Units by Does not apply route daily as needed. 1 kit 1   budesonide-formoterol (SYMBICORT) 80-4.5 MCG/ACT inhaler Take 2 puffs first thing in am and then another 2 puffs about 12 hours later. 1 each 12   Cholecalciferol (VITAMIN D3) 1000 UNITS tablet Take 2,000 Units by mouth daily.     Coenzyme Q10 (CO Q-10) 200 MG CAPS Take 400 mg by mouth daily.      esomeprazole (NEXIUM) 40 MG capsule TAKE 1 CAPSULE (40 MG TOTAL) BY MOUTH DAILY. 90 capsule 3   Folic Acid 0.8 MG CAPS Take 2 capsules by mouth every morning.  30 each 0   glucose blood test strip USE TO TEST BLOOD SUGAR TWICE A DAY 100 each 3   hydrOXYzine (ATARAX/VISTARIL) 25 MG tablet TAKE 1-2 TABLETS AT BEDTIME AS NEED FOR INSOMNIA 180 tablet 1   metFORMIN (GLUCOPHAGE-XR) 750 MG 24 hr tablet TAKE 1 TABLET BY MOUTH EVERY DAY WITH BREAKFAST 90 tablet 1   OneTouch Delica Lancets 17E MISC USE TO CHECK BLOOD SUGAR TWICE DAILY 100 each 3   ONETOUCH VERIO test strip USE TO CHECK BLOOD SUGAR DAILY 100 strip 5   pioglitazone (ACTOS) 45 MG tablet Take 1 tablet (45 mg total) by  mouth every other day. (Patient taking differently: Take 45 mg by mouth every other day. Paatient only takes 1/2every other day) 90 tablet 3   traMADol (ULTRAM) 50 MG tablet TAKE 0.5-1 TABLETS (25-50 MG TOTAL) BY MOUTH EVERY 6 (SIX) HOURS AS NEEDED (COUGH). 100 tablet 1   No facility-administered medications prior to visit.    ROS: Review of Systems  Constitutional:  Negative for appetite change, fatigue and unexpected weight change.  HENT:  Negative for congestion, nosebleeds, sneezing, sore throat and trouble swallowing.   Eyes:  Negative for itching and visual disturbance.  Respiratory:  Positive for cough.   Cardiovascular:  Negative for chest pain, palpitations and leg swelling.  Gastrointestinal:  Negative for abdominal distention, blood in stool, diarrhea and nausea.  Genitourinary:  Negative for frequency and hematuria.  Musculoskeletal:  Negative for back pain, gait problem, joint swelling and neck pain.  Skin:  Negative for rash.  Neurological:  Negative for dizziness, tremors, speech difficulty and weakness.  Psychiatric/Behavioral:  Negative for agitation, dysphoric mood and sleep disturbance. The patient is not nervous/anxious.     Objective:  BP 130/82 (BP Location: Left Arm, Patient Position: Sitting, Cuff Size: Normal)   Pulse (!) 59  Temp 98.2 F (36.8 C) (Oral)   Ht '5\' 4"'  (1.626 m)   Wt 199 lb (90.3 kg)   SpO2 94%   BMI 34.16 kg/m   BP Readings from Last 3 Encounters:  11/29/21 130/82  10/31/21 130/72  10/17/21 (!) 142/88    Wt Readings from Last 3 Encounters:  11/29/21 199 lb (90.3 kg)  10/31/21 200 lb (90.7 kg)  10/17/21 198 lb 12.8 oz (90.2 kg)    Physical Exam Constitutional:      General: He is not in acute distress.    Appearance: He is well-developed. He is obese.     Comments: NAD  Eyes:     Conjunctiva/sclera: Conjunctivae normal.     Pupils: Pupils are equal, round, and reactive to light.  Neck:     Thyroid: No thyromegaly.      Vascular: No JVD.  Cardiovascular:     Rate and Rhythm: Normal rate and regular rhythm.     Heart sounds: Normal heart sounds. No murmur heard.    No friction rub. No gallop.  Pulmonary:     Effort: Pulmonary effort is normal. No respiratory distress.     Breath sounds: Normal breath sounds. No wheezing or rales.  Chest:     Chest wall: No tenderness.  Abdominal:     General: Bowel sounds are normal. There is no distension.     Palpations: Abdomen is soft. There is no mass.     Tenderness: There is no abdominal tenderness. There is no guarding or rebound.  Musculoskeletal:        General: No tenderness. Normal range of motion.     Cervical back: Normal range of motion.  Lymphadenopathy:     Cervical: No cervical adenopathy.  Skin:    General: Skin is warm and dry.     Findings: No rash.  Neurological:     Mental Status: He is alert and oriented to person, place, and time.     Cranial Nerves: No cranial nerve deficit.     Motor: No abnormal muscle tone.     Coordination: Coordination normal.     Gait: Gait normal.     Deep Tendon Reflexes: Reflexes are normal and symmetric.  Psychiatric:        Behavior: Behavior normal.        Thought Content: Thought content normal.        Judgment: Judgment normal.     Lab Results  Component Value Date   WBC 6.2 10/17/2021   HGB 16.9 10/17/2021   HCT 50.5 10/17/2021   PLT 223.0 10/17/2021   GLUCOSE 137 (H) 10/17/2021   CHOL 216 (H) 10/19/2020   TRIG 94.0 10/19/2020   HDL 81.50 10/19/2020   LDLCALC 115 (H) 10/19/2020   ALT 25 10/17/2021   AST 27 10/17/2021   NA 138 10/17/2021   K 4.2 10/17/2021   CL 99 10/17/2021   CREATININE 0.84 10/17/2021   BUN 12 10/17/2021   CO2 31 10/17/2021   TSH 3.09 10/19/2020   PSA 1.13 10/19/2020   HGBA1C 7.2 (H) 10/17/2021   MICROALBUR 1.9 06/21/2020    CT Chest Wo Contrast  Result Date: 08/30/2021 CLINICAL DATA:  Chronic productive cough, history of COPD, asthma EXAM: CT CHEST WITHOUT  CONTRAST TECHNIQUE: Multidetector CT imaging of the chest was performed following the standard protocol without IV contrast. RADIATION DOSE REDUCTION: This exam was performed according to the departmental dose-optimization program which includes automated exposure control, adjustment of the mA and/or kV  according to patient size and/or use of iterative reconstruction technique. COMPARISON:  12/13/2019 FINDINGS: Cardiovascular: Aortic atherosclerosis. Aortic valve calcifications. Mild cardiomegaly. Three-vessel coronary artery calcifications. No pericardial effusion. Mediastinum/Nodes: No enlarged mediastinal, hilar, or axillary lymph nodes. Thyroid gland, trachea, and esophagus demonstrate no significant findings. Lungs/Pleura: Mild centrilobular and paraseptal emphysema. Mild peripheral interstitial opacity and septal thickening in the bilateral lung bases, which is clearly increased over time (series 3, image 88). Possibly some involvement of non dependent portions of the right middle lobe and lingula. New subpleural nodular opacity of the dependent right lung base measuring 1.0 cm (series 3, image 84). Additional stable, definitively benign small pulmonary nodules measuring 0.3 cm and smaller. No pleural effusion or pneumothorax. Upper Abdomen: No acute abnormality. Musculoskeletal: No chest wall abnormality. No suspicious osseous lesions identified. IMPRESSION: 1. Mild peripheral interstitial opacity and septal thickening in the bilateral lung bases, which is clearly increased over time. Possibly some involvement of non dependent portions of the right middle lobe and lingula. Although more bland post infectious/postinflammatory appearing, mild fibrotic interstitial lung disease is not excluded, and if characterized by ATS pulmonary fibrosis criteria, these findings are in an early indeterminate for UIP pattern per consensus guidelines: Diagnosis of Idiopathic Pulmonary Fibrosis: An Official ATS/ERS/JRS/ALAT  Clinical Practice Guideline. [place], Iss 5, [phone], Jan 25 2017. 2. New subpleural nodular opacity of the dependent right lung base measuring 1.0 cm, most likely infectious or inflammatory, however recommend follow-up CT in 3 months to assess for stability or resolution. 3. Mild emphysema. 4. Coronary artery disease. 5. Aortic valve calcifications. Correlate for echocardiographic evidence of aortic valve dysfunction. Aortic Atherosclerosis (ICD10-I70.0) and Emphysema (ICD10-J43.9). Electronically Signed   By: Delanna Ahmadi M.D.   On: 08/30/2021 08:34    Assessment & Plan:   Problem List Items Addressed This Visit     Diabetes type 2, controlled ([place])     Continue on Metfomin XR, Actos      Relevant Orders   Hemoglobin A1c   Dyslipidemia    trial of Pitavastatin, Vascepa      Well adult exam    We discussed age appropriate health related issues, including available/recomended screening tests and vaccinations. Labs were ordered to be later reviewed . All questions were answered. We discussed one or more of the following - seat belt use, use of sunscreen/sun exposure exercise, fall risk reduction, second hand smoke exposure, firearm use and storage, seat belt use, a need for adhering to healthy diet and exercise. Labs were ordered.  All questions were answered.  Colon due 03/2021      Relevant Orders   TSH   Urinalysis   CBC with Differential/Platelet   Lipid panel   Comprehensive metabolic panel   Hemoglobin A1c   Cologuard   Other Visit Diagnoses     Colon cancer screening    -  Primary   Relevant Orders   Cologuard         No orders of the defined types were placed in this encounter.     Follow-up: Return in about 3 months (around 03/01/2022) for a follow-up visit.  [name], MD

## 2021-12-05 ENCOUNTER — Ambulatory Visit (INDEPENDENT_AMBULATORY_CARE_PROVIDER_SITE_OTHER): Payer: PPO

## 2021-12-05 DIAGNOSIS — Z0000 Encounter for general adult medical examination without abnormal findings: Secondary | ICD-10-CM | POA: Diagnosis not present

## 2021-12-05 NOTE — Patient Instructions (Signed)
[name] , Thank you for taking time to come for your Medicare Wellness Visit. I appreciate your ongoing commitment to your health goals. Please review the following plan we discussed and let me know if I can assist you in the future.   Screening recommendations/referrals: Cologuard: ordered 11/29/2021 Recommended yearly ophthalmology/optometry visit for glaucoma screening and checkup Recommended yearly dental visit for hygiene and checkup  Vaccinations: Influenza vaccine: 02/21/2021 Pneumococcal vaccine: 11/13/2009, 07/23/2013 Tdap vaccine: 10/14/2019; due every 10 years Shingles vaccine: 1/212/2022, 08/16/2020   Covid-19: 07/12/2019, 07/27/2019, 04/18/2020  Advanced directives: Yes; Please bring a copy of your health care power of attorney and living will to the office at your convenience.  Conditions/risks identified: Yes; My goal is to lose 25 pounds.  Next appointment: Please schedule your next Medicare Wellness Visit with your Nurse Health Advisor in 1 year by calling [phone].  Preventive Care 45 Years and Older, Male Preventive care refers to lifestyle choices and visits with your health care provider that can promote health and wellness. What does preventive care include? A yearly physical exam. This is also called an annual well check. Dental exams once or twice a year. Routine eye exams. Ask your health care provider how often you should have your eyes checked. Personal lifestyle choices, including: Daily care of your teeth and gums. Regular physical activity. Eating a healthy diet. Avoiding tobacco and drug use. Limiting alcohol use. Practicing safe sex. Taking low doses of aspirin every day. Taking vitamin and mineral supplements as recommended by your health care provider. What happens during an annual well check? The services and screenings done by your health care provider during your annual well check will depend on your age, overall health, lifestyle risk factors,  and family history of disease. Counseling  Your health care provider may ask you questions about your: Alcohol use. Tobacco use. Drug use. Emotional well-being. Home and relationship well-being. Sexual activity. Eating habits. History of falls. Memory and ability to understand (cognition). Work and work Statistician. Screening  You may have the following tests or measurements: Height, weight, and BMI. Blood pressure. Lipid and cholesterol levels. These may be checked every 5 years, or more frequently if you are over 30 years old. Skin check. Lung cancer screening. You may have this screening every year starting at age 45 if you have a 30-pack-year history of smoking and currently smoke or have quit within the past 15 years. Fecal occult blood test (FOBT) of the stool. You may have this test every year starting at age 19. Flexible sigmoidoscopy or colonoscopy. You may have a sigmoidoscopy every 5 years or a colonoscopy every 10 years starting at age 36. Prostate cancer screening. Recommendations will vary depending on your family history and other risks. Hepatitis C blood test. Hepatitis B blood test. Sexually transmitted disease (STD) testing. Diabetes screening. This is done by checking your blood sugar (glucose) after you have not eaten for a while (fasting). You may have this done every 1-3 years. Abdominal aortic aneurysm (AAA) screening. You may need this if you are a current or former smoker. Osteoporosis. You may be screened starting at age 40 if you are at high risk. Talk with your health care provider about your test results, treatment options, and if necessary, the need for more tests. Vaccines  Your health care provider may recommend certain vaccines, such as: Influenza vaccine. This is recommended every year. Tetanus, diphtheria, and acellular pertussis (Tdap, Td) vaccine. You may need a Td booster every 10 years. Zoster  vaccine. You may need this after age  50. Pneumococcal 13-valent conjugate (PCV13) vaccine. One dose is recommended after age 83. Pneumococcal polysaccharide (PPSV23) vaccine. One dose is recommended after age 54. Talk to your health care provider about which screenings and vaccines you need and how often you need them. This information is not intended to replace advice given to you by your health care provider. Make sure you discuss any questions you have with your health care provider. Document Released: 06/09/2015 Document Revised: 01/31/2016 Document Reviewed: 03/14/2015 Elsevier Interactive Patient Education  2017 [place] Prevention in the Home Falls can cause injuries. They can happen to people of all ages. There are many things you can do to make your home safe and to help prevent falls. What can I do on the outside of my home? Regularly fix the edges of walkways and driveways and fix any cracks. Remove anything that might make you trip as you walk through a door, such as a raised step or threshold. Trim any bushes or trees on the path to your home. Use bright outdoor lighting. Clear any walking paths of anything that might make someone trip, such as rocks or tools. Regularly check to see if handrails are loose or broken. Make sure that both sides of any steps have handrails. Any raised decks and porches should have guardrails on the edges. Have any leaves, snow, or ice cleared regularly. Use sand or salt on walking paths during winter. Clean up any spills in your garage right away. This includes oil or grease spills. What can I do in the bathroom? Use night lights. Install grab bars by the toilet and in the tub and shower. Do not use towel bars as grab bars. Use non-skid mats or decals in the tub or shower. If you need to sit down in the shower, use a plastic, non-slip stool. Keep the floor dry. Clean up any water that spills on the floor as soon as it happens. Remove soap buildup in the tub or shower  regularly. Attach bath mats securely with double-sided non-slip rug tape. Do not have throw rugs and other things on the floor that can make you trip. What can I do in the bedroom? Use night lights. Make sure that you have a light by your bed that is easy to reach. Do not use any sheets or blankets that are too big for your bed. They should not hang down onto the floor. Have a firm chair that has side arms. You can use this for support while you get dressed. Do not have throw rugs and other things on the floor that can make you trip. What can I do in the kitchen? Clean up any spills right away. Avoid walking on wet floors. Keep items that you use a lot in easy-to-reach places. If you need to reach something above you, use a strong step stool that has a grab bar. Keep electrical cords out of the way. Do not use floor polish or wax that makes floors slippery. If you must use wax, use non-skid floor wax. Do not have throw rugs and other things on the floor that can make you trip. What can I do with my stairs? Do not leave any items on the stairs. Make sure that there are handrails on both sides of the stairs and use them. Fix handrails that are broken or loose. Make sure that handrails are as long as the stairways. Check any carpeting to make sure that it  is firmly attached to the stairs. Fix any carpet that is loose or worn. Avoid having throw rugs at the top or bottom of the stairs. If you do have throw rugs, attach them to the floor with carpet tape. Make sure that you have a light switch at the top of the stairs and the bottom of the stairs. If you do not have them, ask someone to add them for you. What else can I do to help prevent falls? Wear shoes that: Do not have high heels. Have rubber bottoms. Are comfortable and fit you well. Are closed at the toe. Do not wear sandals. If you use a stepladder: Make sure that it is fully opened. Do not climb a closed stepladder. Make sure that  both sides of the stepladder are locked into place. Ask someone to hold it for you, if possible. Clearly mark and make sure that you can see: Any grab bars or handrails. First and last steps. Where the edge of each step is. Use tools that help you move around (mobility aids) if they are needed. These include: Canes. Walkers. Scooters. Crutches. Turn on the lights when you go into a dark area. Replace any light bulbs as soon as they burn out. Set up your furniture so you have a clear path. Avoid moving your furniture around. If any of your floors are uneven, fix them. If there are any pets around you, be aware of where they are. Review your medicines with your doctor. Some medicines can make you feel dizzy. This can increase your chance of falling. Ask your doctor what other things that you can do to help prevent falls. This information is not intended to replace advice given to you by your health care provider. Make sure you discuss any questions you have with your health care provider. Document Released: 03/09/2009 Document Revised: 10/19/2015 Document Reviewed: 06/17/2014 Elsevier Interactive Patient Education  2017 [employer].

## 2021-12-05 NOTE — Progress Notes (Addendum)
I connected with [name], [name]. today by telephone and verified that I am speaking with the correct person using two identifiers. Location patient: home Location provider: work Persons participating in the virtual visit: patient, provider.   I discussed the limitations, risks, security and privacy concerns of performing an evaluation and management service by telephone and the availability of in person appointments. I also discussed with the patient that there may be a patient responsible charge related to this service. The patient expressed understanding and verbally consented to this telephonic visit.    Interactive audio and video telecommunications were attempted between this provider and patient, however failed, due to patient having technical difficulties OR patient did not have access to video capability.  We continued and completed visit with audio only.  Some vital signs may be absent or patient reported.   Time Spent with patient on telephone encounter: 30 minutes  Subjective:   [name]. is a 76 y.o. male who presents for Medicare Annual/Subsequent preventive examination.  Review of Systems     Cardiac Risk Factors include: advanced age (>46mn, >>71women);obesity (BMI >30kg/m2);diabetes mellitus;dyslipidemia;family history of premature cardiovascular disease;hypertension;male gender     Objective:    There were no vitals filed for this visit. There is no height or weight on file to calculate BMI.     12/05/2021   11:34 AM 11/23/2020   11:21 AM 06/12/2020   12:33 PM 10/14/2019    8:26 AM 08/21/2016   10:41 AM 07/18/2011   10:53 AM  Advanced Directives  Does Patient Have a Medical Advance Directive? Yes Yes Yes Yes No Patient does not have advance directive  Type of Advance Directive Living will;Healthcare Power of Attorney Living will;Healthcare Power of Attorney Living will;Healthcare Power of AIvinsLiving will    Does  patient want to make changes to medical advance directive? No - Patient declined No - Patient declined No - Patient declined No - Patient declined    Copy of HSaratogain Chart? No - copy requested No - copy requested  No - copy requested    Would patient like information on creating a medical advance directive?     Yes (ED - Information included in AVS)     Current Medications (verified) Outpatient Encounter Medications as of 12/05/2021  Medication Sig   acetaminophen (TYLENOL) 650 MG CR tablet Take 650 mg by mouth 2 (two) times daily as needed for pain.    ALPRAZolam (XANAX) 0.5 MG tablet Take 1-2 tablets (0.5-1 mg total) by mouth at bedtime as needed for anxiety or sleep.   amLODipine (NORVASC) 10 MG tablet TAKE 1 TABLET BY MOUTH DAILY BEFORE BREAKFAST.   aspirin EC 81 MG tablet Take 81 mg by mouth daily.   benzonatate (TESSALON) 100 MG capsule TAKE 1-2 CAPSULES TWICE DAILY AS NEEDED FOR COUGH   bisoprolol (ZEBETA) 5 MG tablet One twice daily   Blood Glucose Monitoring Suppl (ONETOUCH VERIO) w/Device KIT 1 Units by Does not apply route daily as needed.   budesonide-formoterol (SYMBICORT) 80-4.5 MCG/ACT inhaler Take 2 puffs first thing in am and then another 2 puffs about 12 hours later.   Cholecalciferol (VITAMIN D3) 1000 UNITS tablet Take 2,000 Units by mouth daily.   Coenzyme Q10 (CO Q-10) 200 MG CAPS Take 400 mg by mouth daily.    esomeprazole (NEXIUM) 40 MG capsule TAKE 1 CAPSULE (40 MG TOTAL) BY MOUTH DAILY.   Folic Acid 0.8 MG CAPS Take 2  capsules by mouth every morning.    glucose blood test strip USE TO TEST BLOOD SUGAR TWICE A DAY   hydrOXYzine (ATARAX/VISTARIL) 25 MG tablet TAKE 1-2 TABLETS AT BEDTIME AS NEED FOR INSOMNIA   metFORMIN (GLUCOPHAGE-XR) 750 MG 24 hr tablet TAKE 1 TABLET BY MOUTH EVERY DAY WITH BREAKFAST   OneTouch Delica Lancets 18A MISC USE TO CHECK BLOOD SUGAR TWICE DAILY   ONETOUCH VERIO test strip USE TO CHECK BLOOD SUGAR DAILY   pioglitazone  (ACTOS) 45 MG tablet Take 1 tablet (45 mg total) by mouth every other day. (Patient taking differently: Take 45 mg by mouth every other day. Paatient only takes 1/2every other day)   traMADol (ULTRAM) 50 MG tablet TAKE 0.5-1 TABLETS (25-50 MG TOTAL) BY MOUTH EVERY 6 (SIX) HOURS AS NEEDED (COUGH).   [DISCONTINUED] mometasone-formoterol (DULERA) 100-5 MCG/ACT AERO Inhale 2 puffs into the lungs every morning.   No facility-administered encounter medications on file as of 12/05/2021.    Allergies (verified) Olmesartan, Other, Pravastatin, and Symbicort [budesonide-formoterol fumarate]   History: Past Medical History:  Diagnosis Date   Asthma    mild, chronic cough   Chronic kidney disease    kidney stone and infection- started 03-27-11   COPD (chronic obstructive pulmonary disease) (HCC)    Diabetes mellitus    GERD (gastroesophageal reflux disease)    Hyperlipidemia    Hypertension    Obesity    Past Surgical History:  Procedure Laterality Date   APPENDECTOMY  1963   CYSTOSCOPY W/ URETERAL STENT PLACEMENT  03/26/11   removal of kidney stone   MASS EXCISION Left 06/16/2020   Procedure: MINOR EXCISION OF MASS;  Surgeon: Izora Gala, MD;  Location: [facility];  Service: ENT;  Laterality: Left;   Family History  Problem Relation Age of Onset   Multiple sclerosis Mother    Heart disease Father 49       MI   Diabetes Father    Hypertension Father    Heart disease Sister 40       CAD   Diabetes Sister    Diabetes Brother    Parkinsonism Brother    Hypertension Brother    Hypertension Other    Coronary artery disease Other    Colon cancer Neg Hx    Esophageal cancer Neg Hx    Stomach cancer Neg Hx    Cancer - Colon Neg Hx    Social History   Socioeconomic History   Marital status: Married    Spouse name: Not on file   Number of children: 3   Years of education: Not on file   Highest education level: Not on file  Occupational History   Occupation:  Professional  Tobacco Use   Smoking status: Former    Packs/day: 1.50    Years: 40.00    Total pack years: 60.00    Types: Cigarettes    Quit date: 08/13/2001    Years since quitting: 20.3   Smokeless tobacco: Never   Tobacco comments:    Counseled to remain smoke free.  Vaping Use   Vaping Use: Never used  Substance and Sexual Activity   Alcohol use: Yes    Alcohol/week: 10.0 standard drinks of alcohol    Types: 10 Glasses of wine per week    Comment: 2 glasses   Drug use: No   Sexual activity: Yes  Other Topics Concern   Not on file  Social History Narrative   Regular Exercise -  YES; riding  Social Determinants of Health   Financial Resource Strain: Low Risk  (12/05/2021)   Overall Financial Resource Strain (CARDIA)    Difficulty of Paying Living Expenses: Not hard at all  Food Insecurity: No Food Insecurity (12/05/2021)   Hunger Vital Sign    Worried About Running Out of Food in the Last Year: Never true    Ran Out of Food in the Last Year: Never true  Transportation Needs: No Transportation Needs (12/05/2021)   PRAPARE - Hydrologist (Medical): No    Lack of Transportation (Non-Medical): No  Physical Activity: Sufficiently Active (12/05/2021)   Exercise Vital Sign    Days of Exercise per Week: 7 days    Minutes of Exercise per Session: 60 min  Stress: No Stress Concern Present (12/05/2021)   [place]    Feeling of Stress : Not at all  Social Connections: Moderately Isolated (12/05/2021)   Social Connection and Isolation Panel [NHANES]    Frequency of Communication with Friends and Family: More than three times a week    Frequency of Social Gatherings with Friends and Family: Once a week    Attends Religious Services: Never    Marine scientist or Organizations: No    Attends Archivist Meetings: Never    Marital Status: Married    Tobacco  Counseling Counseling given: Not Answered Tobacco comments: Counseled to remain smoke free.   Clinical Intake:  Pre-visit preparation completed: Yes  Pain : No/denies pain     BMI - recorded: 34.16 Nutritional Risks: None Diabetes: Yes CBG done?: No Did pt. bring in CBG monitor from home?: No  How often do you need to have someone help you when you read instructions, pamphlets, or other written materials from your doctor or pharmacy?: 1 - Never What is the last grade level you completed in school?: Master's Degree  Diabetic? yes  Interpreter Needed?: No  Information entered by :: [name], LPN.   Activities of Daily Living    12/05/2021   11:40 AM  In your present state of health, do you have any difficulty performing the following activities:  Hearing? 1  Vision? 0  Difficulty concentrating or making decisions? 0  Walking or climbing stairs? 0  Dressing or bathing? 0  Doing errands, shopping? 0  Preparing Food and eating ? N  Using the Toilet? N  In the past six months, have you accidently leaked urine? N  Do you have problems with loss of bowel control? N  Managing your Medications? N  Managing your Finances? N  Housekeeping or managing your Housekeeping? N    Patient Care Team: Plotnikov, Evie Lacks, MD as PCP - General Croitoru, Mihai, MD as Consulting Physician (Cardiology) [place], Heather, [place] as Consulting Physician (Optometry)  Indicate any recent Medical Services you may have received from other than [facility] providers in the past year (date may be approximate).     Assessment:   This is a routine wellness examination for [place].  Hearing/Vision screen Hearing Screening - Comments:: Patient has hearing difficulty and wears hearing aids. Vision Screening - Comments:: Patient does wear readers.  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 0 0 0 0 0  Number falls in past yr: 1 0 0 0 0  Injury with Fall? 0 0 0 0 0  Risk for fall due to : No Fall Risks No Fall Risks  No Fall Risks   Follow up Falls evaluation completed Falls evaluation completed  Falls evaluation completed     FALL RISK PREVENTION PERTAINING TO THE HOME:  Any stairs in or around the home? Yes  If so, are there any without handrails? No  Home free of loose throw rugs in walkways, pet beds, electrical cords, etc? Yes  Adequate lighting in your home to reduce risk of falls? Yes   ASSISTIVE DEVICES UTILIZED TO PREVENT FALLS:  Life alert? No  Use of a cane, walker or w/c? No  Grab bars in the bathroom? No  Shower chair or bench in shower? Yes  Elevated toilet seat or a handicapped toilet? No   TIMED UP AND GO:  Was the test performed? No .  Length of time to ambulate 10 feet: n/a sec.   Appearance of gait: Gait not evaluated during this visit.  Cognitive Function:        12/05/2021   11:50 AM  6CIT Screen  What Year? 0 points  What month? 0 points  What time? 0 points  Count back from 20 0 points  Months in reverse 0 points  Repeat phrase 0 points  Total Score 0 points    Immunizations Immunization History  Administered Date(s) Administered   Fluad Quad(high Dose 65+) 02/22/2020, 02/21/2021   Influenza Split 02/21/2012    Influenza Whole 02/25/2011   Influenza, High Dose Seasonal PF 03/10/2013, 04/10/2016, 02/19/2017, 03/16/2018, 01/21/2019   Influenza,inj,Quad PF,6+ Mos 03/28/2014, 04/04/2015   PFIZER(Purple Top)SARS-COV-2 Vaccination 07/02/2019, 07/27/2019, 04/18/2020   PNEUMOCOCCAL CONJUGATE-20 11/29/2021   Pneumococcal Conjugate-13 07/23/2013   Pneumococcal Polysaccharide-23 11/13/2009   Td 11/13/2009   Tdap 10/14/2019   Zoster Recombinat (Shingrix) 06/07/2020, 08/16/2020   Zoster, Live 11/20/2009    TDAP status: Up to date  Flu Vaccine status: Up to date  Pneumococcal vaccine status: Up to date  Covid-19 vaccine status: Completed vaccines  Qualifies for Shingles Vaccine? Yes   Zostavax completed Yes   Shingrix Completed?: Yes  Screening Tests Health Maintenance  Topic Date Due   COVID-19 Vaccine (4 - Booster for Pfizer series) 06/13/2020   FOOT EXAM  06/15/2020   Diabetic kidney evaluation - Urine ACR  06/21/2021   INFLUENZA VACCINE  12/25/2021   OPHTHALMOLOGY EXAM  02/27/2022   HEMOGLOBIN A1C  06/01/2022   Diabetic kidney evaluation - GFR measurement  11/30/2022   TETANUS/TDAP  10/13/2029   Pneumonia Vaccine 28+ Years old  Completed   Hepatitis C Screening  Completed   Zoster Vaccines- Shingrix  Completed   HPV VACCINES  Aged Out   COLONOSCOPY (Pts 45-30yr Insurance coverage  will need to be confirmed)  Discontinued    Health Maintenance  Health Maintenance Due  Topic Date Due   COVID-19 Vaccine (4 - Booster for Pfizer series) 06/13/2020   FOOT EXAM  06/15/2020   Diabetic kidney evaluation - Urine ACR  06/21/2021    Colorectal screening: Cologuard order placed 11/29/2021  Lung Cancer Screening: (Low Dose CT Chest recommended if Age 38-80 years, 30 pack-year currently smoking OR have quit w/in 15years.) does not qualify.   Lung Cancer Screening Referral: no  Additional Screening:  Hepatitis C Screening: does qualify; Completed 08/21/2016  Vision Screening: Recommended  annual ophthalmology exams for early detection of glaucoma and other disorders of the eye. Is the patient up to date with their annual eye exam?  Yes  Who is the provider or what is the name of the office in which the patient attends annual eye exams? [facility] If pt is not established with a provider, would they like to be referred to a provider to establish care? No .   Dental Screening: Recommended annual dental exams for proper oral hygiene  Community Resource Referral / Chronic Care Management: CRR required this visit?  No   CCM required this visit?  No      Plan:     I have personally reviewed and noted the following in the patient's chart:   Medical and social history Use of alcohol, tobacco or illicit drugs  Current medications and supplements including opioid prescriptions. Patient is not currently taking opioid prescriptions. Functional ability and status Nutritional status Physical activity Advanced directives List of other physicians Hospitalizations, surgeries, and ER visits in previous 12 months Vitals Screenings to include cognitive, depression, and falls Referrals and appointments  In addition, I have reviewed and discussed with patient certain preventive protocols, quality metrics, and best practice recommendations. A written personalized care plan for preventive services as well as general preventive health recommendations were provided to patient.     Sheral Flow, LPN   01/04/314   Nurse Notes:  Patient is cogitatively intact. There were no vitals filed for this visit. There is no height or weight on file to calculate BMI. Patient stated that he has no issues with gait or balance; does not use any assistive devices.   Medical screening examination/treatment/procedure(s) were performed by non-physician practitioner and as supervising physician I was immediately available for consultation/collaboration.  I agree with above. Lew Dawes,  MD

## 2021-12-10 ENCOUNTER — Ambulatory Visit (HOSPITAL_COMMUNITY)
Admission: RE | Admit: 2021-12-10 | Discharge: 2021-12-10 | Disposition: A | Discharge status: Home / Self Care | Payer: PPO | Source: Ambulatory Visit | Attending: Internal Medicine | Admitting: Internal Medicine

## 2021-12-10 ENCOUNTER — Encounter (HOSPITAL_COMMUNITY): Payer: Self-pay

## 2021-12-10 DIAGNOSIS — J439 Emphysema, unspecified: Secondary | ICD-10-CM | POA: Diagnosis not present

## 2021-12-10 DIAGNOSIS — R911 Solitary pulmonary nodule: Secondary | ICD-10-CM | POA: Diagnosis not present

## 2021-12-10 DIAGNOSIS — R918 Other nonspecific abnormal finding of lung field: Secondary | ICD-10-CM | POA: Diagnosis not present

## 2021-12-13 NOTE — Progress Notes (Signed)
Spoke with pt and notified of results per Dr. Wert. Pt verbalized understanding and denied any questions. 

## 2021-12-16 ENCOUNTER — Encounter: Payer: Self-pay | Admitting: Internal Medicine

## 2021-12-17 MED ORDER — PIOGLITAZONE HCL 45 MG PO TABS: 45.0000 mg | ORAL_TABLET | Freq: Every day | ORAL | 3 refills | Status: DC

## 2022-01-17 ENCOUNTER — Ambulatory Visit: Payer: PPO | Admitting: Internal Medicine

## 2022-02-20 ENCOUNTER — Encounter: Payer: Self-pay | Admitting: Internal Medicine

## 2022-02-21 ENCOUNTER — Other Ambulatory Visit: Payer: Self-pay | Admitting: Internal Medicine

## 2022-02-21 NOTE — Telephone Encounter (Signed)
Received the following message from patient:   "The blood pressure medicine you put me on is causing a runny nose all the time. Can you change it back to what I was previously on?  It is Bisoprolol and it causes my nose to run. Thank you."  Dr. Melvyn Novas, can you please advise? Thanks!

## 2022-02-21 NOTE — Telephone Encounter (Signed)
Have not heard that be a problem but sorry if that medicine caused it   Rec  Coreg 25 mg bid was the last dose on our records, might want to double check with pharmacy that it is the same dose he was on as this is pretty strong   See if cough or breathing any better / worse as a result of these changes and regroup with me next available if not happy with control of resp symptoms

## 2022-02-22 ENCOUNTER — Encounter: Payer: Self-pay | Admitting: Internal Medicine

## 2022-02-25 ENCOUNTER — Other Ambulatory Visit: Payer: Self-pay | Admitting: Internal Medicine

## 2022-02-25 MED ORDER — CARVEDILOL 25 MG PO TABS
25.0000 mg | ORAL_TABLET | Freq: Two times a day (BID) | ORAL | 3 refills | Status: DC
Start: 2022-02-25 — End: 2023-03-12

## 2022-02-27 DIAGNOSIS — E119 Type 2 diabetes mellitus without complications: Secondary | ICD-10-CM | POA: Diagnosis not present

## 2022-02-27 LAB — HM DIABETES EYE EXAM

## 2022-03-06 ENCOUNTER — Ambulatory Visit: Payer: PPO | Admitting: Internal Medicine

## 2022-03-11 ENCOUNTER — Ambulatory Visit: Payer: PPO | Admitting: Internal Medicine

## 2022-03-20 DIAGNOSIS — H2513 Age-related nuclear cataract, bilateral: Secondary | ICD-10-CM | POA: Diagnosis not present

## 2022-03-20 DIAGNOSIS — E119 Type 2 diabetes mellitus without complications: Secondary | ICD-10-CM | POA: Diagnosis not present

## 2022-03-20 DIAGNOSIS — H353111 Nonexudative age-related macular degeneration, right eye, early dry stage: Secondary | ICD-10-CM | POA: Diagnosis not present

## 2022-03-20 DIAGNOSIS — H353122 Nonexudative age-related macular degeneration, left eye, intermediate dry stage: Secondary | ICD-10-CM | POA: Diagnosis not present

## 2022-03-25 ENCOUNTER — Ambulatory Visit (INDEPENDENT_AMBULATORY_CARE_PROVIDER_SITE_OTHER): Payer: PPO | Admitting: Internal Medicine

## 2022-03-25 ENCOUNTER — Encounter: Payer: Self-pay | Admitting: Internal Medicine

## 2022-03-25 VITALS — BP 122/68 | HR 67 | Temp 98.1°F | Ht 64.0 in | Wt 194.6 lb

## 2022-03-25 DIAGNOSIS — K219 Gastro-esophageal reflux disease without esophagitis: Secondary | ICD-10-CM

## 2022-03-25 DIAGNOSIS — I2510 Atherosclerotic heart disease of native coronary artery without angina pectoris: Secondary | ICD-10-CM

## 2022-03-25 DIAGNOSIS — Z23 Encounter for immunization: Secondary | ICD-10-CM

## 2022-03-25 DIAGNOSIS — E785 Hyperlipidemia, unspecified: Secondary | ICD-10-CM

## 2022-03-25 DIAGNOSIS — F5101 Primary insomnia: Secondary | ICD-10-CM | POA: Diagnosis not present

## 2022-03-25 DIAGNOSIS — Z1211 Encounter for screening for malignant neoplasm of colon: Secondary | ICD-10-CM

## 2022-03-25 DIAGNOSIS — I2584 Coronary atherosclerosis due to calcified coronary lesion: Secondary | ICD-10-CM

## 2022-03-25 DIAGNOSIS — E119 Type 2 diabetes mellitus without complications: Secondary | ICD-10-CM

## 2022-03-25 DIAGNOSIS — J45909 Unspecified asthma, uncomplicated: Secondary | ICD-10-CM

## 2022-03-25 LAB — COMPREHENSIVE METABOLIC PANEL
ALT: 19 U/L (ref 0–53)
AST: 23 U/L (ref 0–37)
Albumin: 4 g/dL (ref 3.5–5.2)
Alkaline Phosphatase: 80 U/L (ref 39–117)
BUN: 15 mg/dL (ref 6–23)
CO2: 27 mEq/L (ref 19–32)
Calcium: 9.1 mg/dL (ref 8.4–10.5)
Chloride: 101 mEq/L (ref 96–112)
Creatinine, Ser: 0.93 mg/dL (ref 0.40–1.50)
GFR: 79 mL/min (ref >60.00)
Glucose, Bld: 134 mg/dL — ABNORMAL HIGH (ref 70–99)
Potassium: 4.2 mEq/L (ref 3.5–5.1)
Sodium: 136 mEq/L (ref 135–145)
Total Bilirubin: 0.6 mg/dL (ref 0.2–1.2)
Total Protein: 6.9 g/dL (ref 6.0–8.3)

## 2022-03-25 LAB — HEMOGLOBIN A1C: Hgb A1c MFr Bld: 6.8 % — ABNORMAL HIGH (ref 4.6–6.5)

## 2022-03-25 MED ORDER — ZOLPIDEM TARTRATE 10 MG PO TABS: 5.0000 mg | ORAL_TABLET | Freq: Every evening | ORAL | 5 refills | Status: DC | PRN

## 2022-03-25 NOTE — Assessment & Plan Note (Signed)
Doing well Cont w/Nexium

## 2022-03-25 NOTE — Assessment & Plan Note (Signed)
Better on Symbicort

## 2022-03-25 NOTE — Progress Notes (Signed)
Subjective:  Patient ID: Lars Mage., male    DOB: 1945-10-29  Age: 76 y.o. MRN: [id number]  CC: Follow-up (3 Month f/u- Flu shot)   HPI Marley Charlot. presents for HTN, DM, GERD, asthma C/o insomnia  Outpatient Medications Prior to Visit  Medication Sig Dispense Refill   amLODipine (NORVASC) 10 MG tablet TAKE 1 TABLET BY MOUTH DAILY BEFORE BREAKFAST. 90 tablet 3   aspirin EC 81 MG tablet Take 81 mg by mouth daily.     Blood Glucose Monitoring Suppl (ONETOUCH VERIO) w/Device KIT 1 Units by Does not apply route daily as needed. 1 kit 1   budesonide-formoterol (SYMBICORT) 80-4.5 MCG/ACT inhaler Take 2 puffs first thing in am and then another 2 puffs about 12 hours later. 1 each 12   carvedilol (COREG) 25 MG tablet Take 1 tablet (25 mg total) by mouth 2 (two) times daily with a meal. 180 tablet 3   Cholecalciferol (VITAMIN D3) 1000 UNITS tablet Take 2,000 Units by mouth daily.     Coenzyme Q10 (COQ10 MAXIMUM STRENGTH) 400 MG CAPS Take by mouth. Take 1 capsule a day     Folic Acid 0.8 MG CAPS Take 2 capsules by mouth every morning.  30 each 0   glucose blood test strip USE TO TEST BLOOD SUGAR TWICE A DAY 100 each 3   hydrOXYzine (ATARAX/VISTARIL) 25 MG tablet TAKE 1-2 TABLETS AT BEDTIME AS NEED FOR INSOMNIA 180 tablet 1   metFORMIN (GLUCOPHAGE-XR) 750 MG 24 hr tablet TAKE 1 TABLET BY MOUTH EVERY DAY WITH BREAKFAST 90 tablet 1   OneTouch Delica Lancets 93Z MISC USE TO CHECK BLOOD SUGAR TWICE DAILY 100 each 3   ONETOUCH VERIO test strip USE TO CHECK BLOOD SUGAR DAILY 100 strip 5   pioglitazone (ACTOS) 45 MG tablet Take 1 tablet (45 mg total) by mouth daily. 90 tablet 3   traMADol (ULTRAM) 50 MG tablet TAKE 0.5-1 TABLETS (25-50 MG TOTAL) BY MOUTH EVERY 6 (SIX) HOURS AS NEEDED (COUGH). 100 tablet 1   acetaminophen (TYLENOL) 650 MG CR tablet Take 650 mg by mouth 2 (two) times daily as needed for pain.  (Patient not taking: Reported on 03/25/2022)     ALPRAZolam (XANAX) 0.5 MG  tablet TAKE 1-2 TABLETS (0.5-1 MG TOTAL) BY MOUTH AT BEDTIME AS NEEDED FOR ANXIETY OR SLEEP. (Patient not taking: Reported on 03/25/2022) 180 tablet 0   benzonatate (TESSALON) 100 MG capsule TAKE 1-2 CAPSULES TWICE DAILY AS NEEDED FOR COUGH (Patient not taking: Reported on 03/25/2022) 30 capsule 1   Coenzyme Q10 (CO Q-10) 200 MG CAPS Take 400 mg by mouth daily.  (Patient not taking: Reported on 03/25/2022)     esomeprazole (NEXIUM) 40 MG capsule TAKE 1 CAPSULE (40 MG TOTAL) BY MOUTH DAILY. (Patient not taking: Reported on 03/25/2022) 90 capsule 3   No facility-administered medications prior to visit.    ROS: Review of Systems  Constitutional:  Negative for appetite change, fatigue and unexpected weight change.  HENT:  Negative for congestion, nosebleeds, sneezing, sore throat and trouble swallowing.   Eyes:  Negative for itching and visual disturbance.  Respiratory:  Negative for cough.   Cardiovascular:  Negative for chest pain, palpitations and leg swelling.  Gastrointestinal:  Negative for abdominal distention, blood in stool, diarrhea and nausea.  Genitourinary:  Negative for frequency and hematuria.  Musculoskeletal:  Negative for back pain, gait problem, joint swelling and neck pain.  Skin:  Negative for rash.  Neurological:  Negative for  dizziness, tremors, speech difficulty and weakness.  Psychiatric/Behavioral:  Negative for agitation, dysphoric mood, sleep disturbance and suicidal ideas. The patient is not nervous/anxious.     Objective:  BP 122/68 (BP Location: Left Arm)   Pulse 67   Temp 98.1 F (36.7 C) (Oral)   Ht _0  (1.626 m)   Wt 194 lb 9.6 oz (88.3 kg)   SpO2 95%   BMI 33.40 kg/m   BP Readings from Last 3 Encounters:  03/25/22 122/68  11/29/21 130/82  10/31/21 130/72    Wt Readings from Last 3 Encounters:  03/25/22 194 lb 9.6 oz (88.3 kg)  11/29/21 199 lb (90.3 kg)  10/31/21 200 lb (90.7 kg)    Physical Exam Constitutional:      General: He is not  in acute distress.    Appearance: He is well-developed. He is obese.     Comments: NAD  Eyes:     Conjunctiva/sclera: Conjunctivae normal.     Pupils: Pupils are equal, round, and reactive to light.  Neck:     Thyroid: No thyromegaly.     Vascular: No JVD.  Cardiovascular:     Rate and Rhythm: Normal rate and regular rhythm.     Heart sounds: Normal heart sounds. No murmur heard.    No friction rub. No gallop.  Pulmonary:     Effort: Pulmonary effort is normal. No respiratory distress.     Breath sounds: Normal breath sounds. No wheezing or rales.  Chest:     Chest wall: No tenderness.  Abdominal:     General: Bowel sounds are normal. There is no distension.     Palpations: Abdomen is soft. There is no mass.     Tenderness: There is no abdominal tenderness. There is no guarding or rebound.  Musculoskeletal:        General: No tenderness. Normal range of motion.     Cervical back: Normal range of motion.  Lymphadenopathy:     Cervical: No cervical adenopathy.  Skin:    General: Skin is warm and dry.     Findings: No rash.  Neurological:     Mental Status: He is alert and oriented to person, place, and time.     Cranial Nerves: No cranial nerve deficit.     Motor: No abnormal muscle tone.     Coordination: Coordination normal.     Gait: Gait normal.     Deep Tendon Reflexes: Reflexes are normal and symmetric.  Psychiatric:        Behavior: Behavior normal.        Thought Content: Thought content normal.        Judgment: Judgment normal.     Lab Results  Component Value Date   WBC 6.7 11/29/2021   HGB 16.9 11/29/2021   HCT 50.6 11/29/2021   PLT 246.0 11/29/2021   GLUCOSE 126 (H) 11/29/2021   CHOL 207 (H) 11/29/2021   TRIG 81.0 11/29/2021   HDL 85.00 11/29/2021   LDLCALC 106 (H) 11/29/2021   ALT 28 11/29/2021   AST 27 11/29/2021   NA 137 11/29/2021   K 4.3 11/29/2021   CL 100 11/29/2021   CREATININE 0.81 11/29/2021   BUN 16 11/29/2021   CO2 30 11/29/2021    TSH 1.97 11/29/2021   PSA 1.13 10/19/2020   HGBA1C 7.4 (H) 11/29/2021   MICROALBUR 1.9 06/21/2020    CT Super D Chest Wo Contrast  Result Date: 12/11/2021 CLINICAL DATA:  Pulmonary nodule. EXAM: CT CHEST WITHOUT CONTRAST  TECHNIQUE: Multidetector CT imaging of the chest was performed using thin slice collimation for electromagnetic bronchoscopy planning purposes, without intravenous contrast. RADIATION DOSE REDUCTION: This exam was performed according to the departmental dose-optimization program which includes automated exposure control, adjustment of the mA and/or kV according to patient size and/or use of iterative reconstruction technique. COMPARISON:  08/29/2021. FINDINGS: Cardiovascular: The heart size is normal. No substantial pericardial effusion. Coronary artery calcification is evident. Mild atherosclerotic calcification is noted in the wall of the thoracic aorta. Mediastinum/Nodes: No mediastinal lymphadenopathy. No evidence for gross hilar lymphadenopathy although assessment is limited by the lack of intravenous contrast on the current study. The esophagus has normal imaging features. There is no axillary lymphadenopathy. Lungs/Pleura: Centrilobular and paraseptal emphysema evident. 5 mm left lower lobe nodule on 95/7 is stable in the interval. The 10 mm new nodule of concern in the subpleural right lower lobe previously has resolved in the interval. No new suspicious pulmonary nodule or mass. Subpleural reticulation again noted bilaterally. No focal airspace consolidation. No pleural effusion. Upper Abdomen: Unremarkable Musculoskeletal: No worrisome lytic or sclerotic osseous abnormality. IMPRESSION: 1. The 10 mm new nodule of concern in the subpleural right lower lobe previously has resolved in the interval. 2. 5 mm left lower lobe pulmonary nodule is stable in the interval. Present since a chest CT of 07/09/2016, this is consistent with benign etiology. 3. Aortic Atherosclerosis (ICD10-I70.0)  and Emphysema (ICD10-J43.9). Electronically Signed   By: Misty Stanley M.D.   On: 12/11/2021 13:29    Assessment & Plan:   Problem List Items Addressed This Visit     Coronary artery calcification    On Pravachol      Diabetes type 2, controlled ([place]) - Primary   Relevant Orders   Comprehensive metabolic panel   Hemoglobin A1c   Dyslipidemia     trial of Pitavastatin, Vascepa      GERD    Doing well Cont w/Nexium       Insomnia    Start Zolpidem  Potential benefits of a long term benzodiazepines  use as well as potential risks  and complications were explained to the patient and were aknowledged.       Intrinsic asthma    Better on Symbicort      Other Visit Diagnoses     Needs flu shot       Relevant Orders   Flu Vaccine QUAD High Dose(Fluad) (Completed)   Colon cancer screening       Relevant Orders   Cologuard         Meds ordered this encounter  Medications   zolpidem (AMBIEN) 10 MG tablet    Sig: Take 0.5-1 tablets (5-10 mg total) by mouth at bedtime as needed for sleep.    Dispense:  30 tablet    Refill:  5      Follow-up: No follow-ups on file.  [name], MD

## 2022-03-25 NOTE — Assessment & Plan Note (Signed)
On Pravachol 

## 2022-03-25 NOTE — Assessment & Plan Note (Signed)
trial of Pitavastatin, Vascepa

## 2022-03-25 NOTE — Assessment & Plan Note (Signed)
Start Zolpidem  Potential benefits of a long term benzodiazepines  use as well as potential risks  and complications were explained to the patient and were aknowledged.

## 2022-04-03 ENCOUNTER — Encounter: Payer: Self-pay | Admitting: Internal Medicine

## 2022-04-04 NOTE — Telephone Encounter (Signed)
These are powders with different technique > try pricing dulera 100 Take 2 puffs first thing in am and then another 2 puffs about 12 hours later.   Of not able ot affored then wixella 100 one bid but will need ov w/in a month of start to be sure it's ok but bring device in with hime to confirm optimal technique

## 2022-04-05 ENCOUNTER — Other Ambulatory Visit (HOSPITAL_COMMUNITY): Payer: Self-pay

## 2022-04-05 NOTE — Telephone Encounter (Signed)
Test claim for Wixela currently shows a $15.00 co-pay.

## 2022-04-14 ENCOUNTER — Encounter: Payer: Self-pay | Admitting: Internal Medicine

## 2022-04-15 MED ORDER — ONETOUCH VERIO W/DEVICE KIT: 1.0000 [IU] | PACK | Freq: Every day | 0 refills | Status: AC | PRN

## 2022-04-22 ENCOUNTER — Other Ambulatory Visit: Payer: Self-pay | Admitting: Internal Medicine

## 2022-05-06 ENCOUNTER — Encounter: Payer: Self-pay | Admitting: Internal Medicine

## 2022-05-06 DIAGNOSIS — Z1211 Encounter for screening for malignant neoplasm of colon: Secondary | ICD-10-CM

## 2022-05-08 ENCOUNTER — Ambulatory Visit: Payer: PPO | Admitting: Internal Medicine

## 2022-05-28 DIAGNOSIS — Z1211 Encounter for screening for malignant neoplasm of colon: Secondary | ICD-10-CM | POA: Diagnosis not present

## 2022-05-31 LAB — COLOGUARD: COLOGUARD: NEGATIVE

## 2022-06-02 NOTE — Progress Notes (Signed)
Subjective:     Patient ID: [name], male   DOB: 09/07/1945,    MRN: [id number]    Brief patient profile:  87  yowm quit smoking 2003 with some coughing that improved p quit but never completely resolved.   History of Present Illness  12/10/2010 ov/[name] Initial pulmonary office eval in EMR era chronic cough ("all my life", maybe started p started smoking)  some better on dulera x 2 months ,  Tends to be worse during the day and better sleeping,  No am awakening or early exac.  No problems with using voice or swallowing. Takes nexium right before bfast x many years.  Laughing hard can bring it own.only using dulera 200 now.  No purulent sputum over sinus or reflux symptoms rec GERD diet  Increase dulera 200 Take 2 puffs first thing in am and then another 2 puffs about 12 hours later.  Work on inhaler technique    09/19/2021  Re-establish ov/[name] re: cough variant asthma vs UACS   maint on Trelegy / no better on it nor with clariton /singulair/ abn ct post covid  Chief Complaint  Patient presents with   Consult    Patient states that PCP ordered a CT scan that was done 08/29/2021 and told patient that he needed to be seen. Patient states he always has a cough because he has COPD. Patient was tried on 2 different allergy meds but states he did not notice a difference.   Dyspnea:  walks slower than others flat surface ok but struggles on hills or faster on flat surface = MMRC2 = can't walk a nl pace on a flat grade s sob but does fine slow and flat Cough: to gag/vomit  Sleeping: on side, bed is flat 2 pillows  SABA use: not using  02: none  Covid status:  vax x 3  Rec Symbicort 80  Take 2 puffs first thing in am and then another 2 puffs about 12 hours later.  Nexium 40 mg Take 30-60 min before first meal of the day  Stop corvedilol  and start bisoprol 5 mg twice daily  GERD diet reviewed, bed blocks rec  We will schedule Echocardiogram > mild/mod AS      10/31/2021  f/u ov/[name]  re: mild/mod AS  maint on symbicort 80   Chief Complaint  Patient presents with   Follow-up    Pt states he has been doing okay since last visit and denies any complaints.   Dyspnea:  no change  Cough: daytime / no longer gagging off trelegy  Sleeping: falt bed 2 pillows  SABA use: not using  02: none  Covid status:   vax x 3 / one vax  Rec No change in medications  Take delsym two tsp every 12 hours  as needed.  Please schedule a follow up visit in 6  months but call sooner if needed    06/03/2022  f/u ov/[name] re: ? Cough variant asthma  maint on symbicort  80 prn  Chief Complaint  Patient presents with   Follow-up    Cough improved.  No other sx noted.  Dyspnea:  MMRC2 = can't walk a nl pace on a flat grade s sob but does fine slow and flat  Can feed horses and back up to house with some sob per wife though he minimizes this / no change on or off symbicort   Cough: gone  Sleeping:   no problem flat  SABA use: none  02: none      No obvious day to day or daytime variability or assoc excess/ purulent sputum or mucus plugs or hemoptysis or cp or chest tightness, subjective wheeze or overt sinus or hb symptoms.   Sleeping as above without nocturnal  or early am exacerbation  of respiratory  c/o's or need for noct saba. Also denies any obvious fluctuation of symptoms with weather or environmental changes or other aggravating or alleviating factors except as outlined above   No unusual exposure hx or h/o childhood pna/ asthma or knowledge of premature birth.  Current Allergies, Complete Past Medical History, Past Surgical History, Family History, and Social History were reviewed in [employer] record.  ROS  The following are not active complaints unless bolded Hoarseness, sore throat, dysphagia, dental problems, itching, sneezing,  nasal congestion or discharge of excess mucus or purulent secretions, ear ache,   fever, chills, sweats, unintended wt loss or wt  gain, classically pleuritic or exertional cp,  orthopnea pnd or arm/hand swelling  or leg swelling, presyncope, palpitations, abdominal pain, anorexia, nausea, vomiting, diarrhea  or change in bowel habits or change in bladder habits, change in stools or change in urine, dysuria, hematuria,  rash, arthralgias, visual complaints, headache, numbness, weakness or ataxia or problems with walking or coordination,  change in mood or  memory.        Current Meds  Medication Sig   amLODipine (NORVASC) 10 MG tablet TAKE 1 TABLET BY MOUTH DAILY BEFORE BREAKFAST.   aspirin EC 81 MG tablet Take 81 mg by mouth daily.   Blood Glucose Monitoring Suppl (ONETOUCH VERIO) w/Device KIT 1 Units by Does not apply route daily as needed.   carvedilol (COREG) 25 MG tablet Take 1 tablet (25 mg total) by mouth 2 (two) times daily with a meal.   Cholecalciferol (VITAMIN D3) 1000 UNITS tablet Take 2,000 Units by mouth daily.   Coenzyme Q10 (COQ10 MAXIMUM STRENGTH) 400 MG CAPS Take by mouth. Take 1 capsule a day   fluticasone-salmeterol (ADVAIR HFA) 45-21 MCG/ACT inhaler Inhale 2 puffs into the lungs 2 (two) times daily.   fluticasone-salmeterol (ADVAIR HFA) 45-21 MCG/ACT inhaler Inhale 2 puffs into the lungs 2 (two) times daily.   Folic Acid 0.8 MG CAPS Take 2 capsules by mouth every morning.    glucose blood test strip USE TO TEST BLOOD SUGAR TWICE A DAY   metFORMIN (GLUCOPHAGE-XR) 750 MG 24 hr tablet TAKE 1 TABLET BY MOUTH EVERY DAY WITH BREAKFAST   OneTouch Delica Lancets 84Z MISC USE TO CHECK BLOOD SUGAR TWICE DAILY   ONETOUCH VERIO test strip USE TO CHECK BLOOD SUGAR DAILY   pioglitazone (ACTOS) 45 MG tablet Take 1 tablet (45 mg total) by mouth daily.   traMADol (ULTRAM) 50 MG tablet TAKE 0.5-1 TABLETS (25-50 MG TOTAL) BY MOUTH EVERY 6 (SIX) HOURS AS NEEDED (COUGH).   zolpidem (AMBIEN) 10 MG tablet Take 0.5-1 tablets (5-10 mg total) by mouth at bedtime as needed for sleep.                Objective:   Physical  Exam   wts  06/03/2022         192  10/31/2021         200   09/19/21 202 lb 3.2 oz (91.7 kg)  08/21/21 201 lb (91.2 kg)  08/20/21 199 lb (90.3 kg)   12/10/10          184   Vital signs reviewed  06/03/2022  -  Note at rest 02 sats  99% on RA   General appearance:    amb mod obese wm nad   HEENT : Oropharynx  clear        NECK :  without  apparent JVD/ palpable Nodes/TM    LUNGS: no acc muscle use,  Nl contour chest which is clear to A and P bilaterally without cough on insp or exp maneuvers   CV:  RRR  no s3  3/6 SEM with slt decrease S2  s  increase in P2, and no edema   ABD:  soft and nontender with nl inspiratory excursion in the supine position. No bruits or organomegaly appreciated   MS:  Nl gait/ ext warm without deformities Or obvious joint restrictions  calf tenderness, cyanosis or clubbing    SKIN: warm and dry without lesions    NEURO:  alert, approp, nl sensorium with  no motor or cerebellar deficits apparent.       Assessment:

## 2022-06-03 ENCOUNTER — Encounter: Payer: Self-pay | Admitting: Internal Medicine

## 2022-06-03 ENCOUNTER — Ambulatory Visit: Payer: PPO | Admitting: Internal Medicine

## 2022-06-03 VITALS — BP 118/68 | HR 97 | Temp 97.8°F | Ht 64.0 in | Wt 192.2 lb

## 2022-06-03 DIAGNOSIS — J45991 Cough variant asthma: Secondary | ICD-10-CM

## 2022-06-03 DIAGNOSIS — I350 Nonrheumatic aortic (valve) stenosis: Secondary | ICD-10-CM | POA: Diagnosis not present

## 2022-06-03 MED ORDER — FLUTICASONE-SALMETEROL 45-21 MCG/ACT IN AERO: 2.0000 | INHALATION_SPRAY | Freq: Two times a day (BID) | RESPIRATORY_TRACT | 12 refills | Status: DC

## 2022-06-03 NOTE — Assessment & Plan Note (Signed)
Echo  10/08/21   1. Mild to moderate aortic stenosis is present. Vmax 2.5 m/s, MG 12 mmHG, AVA 1.07 cm2, DI 0.34. Gradients lower than expected due to low SVi (30 cc/m2). The aortic valve is tricuspid. There is moderate calcification of the aortic valve. There is  moderate thickening of the aortic valve. Aortic valve regurgitation is not visualized. Mild to moderate aortic valve stenosis. Aortic valve area, by VTI measures 1.07 cm. Aortic valve mean gradient measures 12.0 mmHg. Aortic valve Vmax measures 2.52  m/s.  2. Left ventricular ejection fraction, by estimation, is 55 to 60%. The left ventricle has normal function. The left ventricle has no regional wall motion abnormalities. There is mild concentric left ventricular hypertrophy. Left ventricular diastolic  parameters were normal.  3. Right ventricular systolic function is normal. The right ventricular size is normal.  4. The mitral valve is grossly normal. Trivial mitral valve regurgitation. No evidence of mitral stenosis.  Advised on symptoms to look for for worsening AS which overlap somewhat with asthma but obvious don't respond well to symbicort/ f/u per PCP/ cards prn          Each maintenance medication was reviewed in detail including emphasizing most importantly the difference between maintenance and prns and under what circumstances the prns are to be triggered using an action plan format where appropriate.  Total time for H and P, chart review, counseling, reviewing hfa device(s) and generating customized AVS unique to this office visit / same day charting = 25 min

## 2022-06-03 NOTE — Patient Instructions (Addendum)
Symbicort  80 or advair 45 is up to 2 every 12 hours as needed   Call or return to clinic as needed if these symptoms worsen or fail to improve as anticipated.

## 2022-06-03 NOTE — Assessment & Plan Note (Signed)
Quit smoking 2003 - onset was in childhood, worse when smoking  - seen 2012 rec dulera and did not return to clinic - 09/19/2021 rec trial of symbicort 80 2bid and max gerd rx/ change coreg to bisoprolol  Dr Melvyn Novas He continues to have daytime cough - sunroom only. Take Claritin qd  Cough resolved to his satisfaction and tapered off symbicort with no increase in sob so if he has asthma at all it is very mild and can use symbicort 80  Based on two studies from NEJM  378; 20 p 1865 (2018) and 380 : p2020-30 (2019) in pts with mild asthma it is reasonable to use low dose symbicort eg 80 2bid "prn" flare in this setting but I emphasized this was only shown with symbicort and takes advantage of the rapid onset of action but is not the same as "rescue therapy" but can be stopped once the acute symptoms have resolved and the need for rescue has been minimized (< 2 x weekly)    If can't get symbicort could try advair hfa 45 and f/u here prn

## 2022-06-10 ENCOUNTER — Encounter: Payer: Self-pay | Admitting: Internal Medicine

## 2022-06-10 MED ORDER — MOMETASONE FURO-FORMOTEROL FUM 100-5 MCG/ACT IN AERO: 2.0000 | INHALATION_SPRAY | Freq: Two times a day (BID) | RESPIRATORY_TRACT | 11 refills | Status: DC

## 2022-06-10 NOTE — Telephone Encounter (Signed)
Mychart message sent by pt: [name]. "[name]"  P [facility] (supporting Tanda Rockers, MD)9 minutes ago (4:07 PM)    Dr Melvyn Novas. The medication you prescribed, fluticasone-salmeterol 45-21 MCG/ACT inhaler is not covered by insurance. It is covered in the 100-50 or 250-50 or 500-50 MCG/ACT.  If any of these are a good substitute, could you have someone call it in to [facility] on [address] in [place] Thank you    Routing this to both Dr. Melvyn Novas as well as our prior auth team to see if any other inhalers are covered by pt's insurance other than Advair Diskus as it seems like what was prescribed was Advair HFA.

## 2022-06-10 NOTE — Telephone Encounter (Signed)
Those are powdered inhalers that don't work the same so rec sample of breztri and f/u with formulary in hand before sample runs out   Rx is Take 1-2 puff every 12 hours as needed   Dulera 100 would also be ok

## 2022-06-10 NOTE — Telephone Encounter (Signed)
Rx for [facility] 100 sent to preferred pharmacy for pt. Nothing further needed.

## 2022-06-12 ENCOUNTER — Other Ambulatory Visit (HOSPITAL_COMMUNITY): Payer: Self-pay

## 2022-06-26 ENCOUNTER — Ambulatory Visit (INDEPENDENT_AMBULATORY_CARE_PROVIDER_SITE_OTHER): Payer: PPO | Admitting: Internal Medicine

## 2022-06-26 ENCOUNTER — Encounter: Payer: Self-pay | Admitting: Internal Medicine

## 2022-06-26 VITALS — BP 118/68 | HR 64 | Temp 98.3°F | Ht 64.0 in | Wt 189.0 lb

## 2022-06-26 DIAGNOSIS — E119 Type 2 diabetes mellitus without complications: Secondary | ICD-10-CM | POA: Diagnosis not present

## 2022-06-26 DIAGNOSIS — M5440 Lumbago with sciatica, unspecified side: Secondary | ICD-10-CM

## 2022-06-26 DIAGNOSIS — D751 Secondary polycythemia: Secondary | ICD-10-CM

## 2022-06-26 DIAGNOSIS — G8929 Other chronic pain: Secondary | ICD-10-CM

## 2022-06-26 DIAGNOSIS — J45991 Cough variant asthma: Secondary | ICD-10-CM | POA: Diagnosis not present

## 2022-06-26 DIAGNOSIS — I10 Essential (primary) hypertension: Secondary | ICD-10-CM | POA: Diagnosis not present

## 2022-06-26 DIAGNOSIS — N320 Bladder-neck obstruction: Secondary | ICD-10-CM

## 2022-06-26 LAB — CBC WITH DIFFERENTIAL/PLATELET
Basophils Absolute: 0.1 10*3/uL (ref 0.0–0.1)
Basophils Relative: 1.1 % (ref 0.0–3.0)
Eosinophils Absolute: 0.3 10*3/uL (ref 0.0–0.7)
Eosinophils Relative: 5.8 % — ABNORMAL HIGH (ref 0.0–5.0)
HCT: 45.8 % (ref 39.0–52.0)
Hemoglobin: 15.4 g/dL (ref 13.0–17.0)
Lymphocytes Relative: 23 % (ref 12.0–46.0)
Lymphs Abs: 1.1 10*3/uL (ref 0.7–4.0)
MCHC: 33.6 g/dL (ref 30.0–36.0)
MCV: 93.3 fl (ref 78.0–100.0)
Monocytes Absolute: 0.7 10*3/uL (ref 0.1–1.0)
Monocytes Relative: 15.7 % — ABNORMAL HIGH (ref 3.0–12.0)
Neutro Abs: 2.6 10*3/uL (ref 1.4–7.7)
Neutrophils Relative %: 54.4 % (ref 43.0–77.0)
Platelets: 197 10*3/uL (ref 150.0–400.0)
RBC: 4.91 Mil/uL (ref 4.22–5.81)
RDW: 14.5 % (ref 11.5–15.5)
WBC: 4.8 10*3/uL (ref 4.0–10.5)

## 2022-06-26 LAB — COMPREHENSIVE METABOLIC PANEL
ALT: 15 U/L (ref 0–53)
AST: 17 U/L (ref 0–37)
Albumin: 4 g/dL (ref 3.5–5.2)
Alkaline Phosphatase: 84 U/L (ref 39–117)
BUN: 18 mg/dL (ref 6–23)
CO2: 28 mEq/L (ref 19–32)
Calcium: 9.3 mg/dL (ref 8.4–10.5)
Chloride: 103 mEq/L (ref 96–112)
Creatinine, Ser: 0.82 mg/dL (ref 0.40–1.50)
GFR: 84.37 mL/min (ref >60.00)
Glucose, Bld: 124 mg/dL — ABNORMAL HIGH (ref 70–99)
Potassium: 5 mEq/L (ref 3.5–5.1)
Sodium: 139 mEq/L (ref 135–145)
Total Bilirubin: 0.5 mg/dL (ref 0.2–1.2)
Total Protein: 6.5 g/dL (ref 6.0–8.3)

## 2022-06-26 LAB — PSA: PSA: 1.07 ng/mL (ref 0.10–4.00)

## 2022-06-26 LAB — HEMOGLOBIN A1C: Hgb A1c MFr Bld: 6.5 % (ref 4.6–6.5)

## 2022-06-26 NOTE — Assessment & Plan Note (Signed)
Continue on Metfomin XR, Actos

## 2022-06-26 NOTE — Progress Notes (Signed)
Subjective:  Patient ID: Chase Mage., male    DOB: Oct 27, 1945  Age: 77 y.o. MRN: [id number]  CC: Follow-up   HPI [name]. presents for cough - resolved, DM, HTN  Outpatient Medications Prior to Visit  Medication Sig Dispense Refill   amLODipine (NORVASC) 10 MG tablet TAKE 1 TABLET BY MOUTH DAILY BEFORE BREAKFAST. 90 tablet 3   aspirin EC 81 MG tablet Take 81 mg by mouth daily.     Blood Glucose Monitoring Suppl (ONETOUCH VERIO) w/Device KIT 1 Units by Does not apply route daily as needed. 1 kit 0   carvedilol (COREG) 25 MG tablet Take 1 tablet (25 mg total) by mouth 2 (two) times daily with a meal. 180 tablet 3   Cholecalciferol (VITAMIN D3) 1000 UNITS tablet Take 2,000 Units by mouth daily.     Coenzyme Q10 (COQ10 MAXIMUM STRENGTH) 400 MG CAPS Take by mouth. Take 1 capsule a day     Folic Acid 0.8 MG CAPS Take 2 capsules by mouth every morning.  30 each 0   glucose blood test strip USE TO TEST BLOOD SUGAR TWICE A DAY 100 each 3   hydrOXYzine (ATARAX/VISTARIL) 25 MG tablet TAKE 1-2 TABLETS AT BEDTIME AS NEED FOR INSOMNIA 180 tablet 1   metFORMIN (GLUCOPHAGE-XR) 750 MG 24 hr tablet TAKE 1 TABLET BY MOUTH EVERY DAY WITH BREAKFAST 90 tablet 1   mometasone-formoterol (DULERA) 100-5 MCG/ACT AERO Inhale 2 puffs into the lungs in the morning and at bedtime. 13 g 11   OneTouch Delica Lancets 14H MISC USE TO CHECK BLOOD SUGAR TWICE DAILY 100 each 3   ONETOUCH VERIO test strip USE TO CHECK BLOOD SUGAR DAILY 100 strip 5   pioglitazone (ACTOS) 45 MG tablet Take 1 tablet (45 mg total) by mouth daily. 90 tablet 3   traMADol (ULTRAM) 50 MG tablet TAKE 0.5-1 TABLETS (25-50 MG TOTAL) BY MOUTH EVERY 6 (SIX) HOURS AS NEEDED (COUGH). 100 tablet 1   zolpidem (AMBIEN) 10 MG tablet Take 0.5-1 tablets (5-10 mg total) by mouth at bedtime as needed for sleep. 30 tablet 5   No facility-administered medications prior to visit.    ROS: Review of Systems  Constitutional:  Negative for  appetite change, fatigue and unexpected weight change.  HENT:  Negative for congestion, nosebleeds, sneezing, sore throat and trouble swallowing.   Eyes:  Negative for itching and visual disturbance.  Respiratory:  Negative for cough.   Cardiovascular:  Negative for chest pain, palpitations and leg swelling.  Gastrointestinal:  Negative for abdominal distention, blood in stool and diarrhea.  Genitourinary:  Negative for frequency and hematuria.  Musculoskeletal:  Negative for back pain, gait problem, joint swelling and neck pain.  Skin:  Negative for rash.  Neurological:  Negative for dizziness, tremors, speech difficulty and weakness.  Psychiatric/Behavioral:  Negative for agitation, dysphoric mood and sleep disturbance. The patient is not nervous/anxious.     Objective:  BP 118/68 (BP Location: Right Arm, Patient Position: Sitting, Cuff Size: Normal)   Pulse 64   Temp 98.3 F (36.8 C) (Oral)   Ht '5\' 4"'$  (1.626 m)   Wt 189 lb (85.7 kg)   SpO2 98%   BMI 32.44 kg/m   BP Readings from Last 3 Encounters:  06/26/22 118/68  06/03/22 118/68  03/25/22 122/68    Wt Readings from Last 3 Encounters:  06/26/22 189 lb (85.7 kg)  06/03/22 192 lb 3.2 oz (87.2 kg)  03/25/22 194 lb 9.6 oz (88.3 kg)  Physical Exam Constitutional:      General: He is not in acute distress.    Appearance: He is well-developed. He is obese.     Comments: NAD  Eyes:     Conjunctiva/sclera: Conjunctivae normal.     Pupils: Pupils are equal, round, and reactive to light.  Neck:     Thyroid: No thyromegaly.     Vascular: No JVD.  Cardiovascular:     Rate and Rhythm: Normal rate and regular rhythm.     Heart sounds: Normal heart sounds. No murmur heard.    No friction rub. No gallop.  Pulmonary:     Effort: Pulmonary effort is normal. No respiratory distress.     Breath sounds: Normal breath sounds. No wheezing or rales.  Chest:     Chest wall: No tenderness.  Abdominal:     General: Bowel sounds are  normal. There is no distension.     Palpations: Abdomen is soft. There is no mass.     Tenderness: There is no abdominal tenderness. There is no guarding or rebound.  Musculoskeletal:        General: No tenderness. Normal range of motion.     Cervical back: Normal range of motion.  Lymphadenopathy:     Cervical: No cervical adenopathy.  Skin:    General: Skin is warm and dry.     Findings: No rash.  Neurological:     Mental Status: He is alert and oriented to person, place, and time.     Cranial Nerves: No cranial nerve deficit.     Motor: No abnormal muscle tone.     Coordination: Coordination normal.     Gait: Gait normal.     Deep Tendon Reflexes: Reflexes are normal and symmetric.  Psychiatric:        Behavior: Behavior normal.        Thought Content: Thought content normal.        Judgment: Judgment normal.     Lab Results  Component Value Date   WBC 6.7 11/29/2021   HGB 16.9 11/29/2021   HCT 50.6 11/29/2021   PLT 246.0 11/29/2021   GLUCOSE 134 (H) 03/25/2022   CHOL 207 (H) 11/29/2021   TRIG 81.0 11/29/2021   HDL 85.00 11/29/2021   LDLCALC 106 (H) 11/29/2021   ALT 19 03/25/2022   AST 23 03/25/2022   NA 136 03/25/2022   K 4.2 03/25/2022   CL 101 03/25/2022   CREATININE 0.93 03/25/2022   BUN 15 03/25/2022   CO2 27 03/25/2022   TSH 1.97 11/29/2021   PSA 1.13 10/19/2020   HGBA1C 6.8 (H) 03/25/2022   MICROALBUR 1.9 06/21/2020    CT Super D Chest Wo Contrast  Result Date: 12/11/2021 CLINICAL DATA:  Pulmonary nodule. EXAM: CT CHEST WITHOUT CONTRAST TECHNIQUE: Multidetector CT imaging of the chest was performed using thin slice collimation for electromagnetic bronchoscopy planning purposes, without intravenous contrast. RADIATION DOSE REDUCTION: This exam was performed according to the departmental dose-optimization program which includes automated exposure control, adjustment of the mA and/or kV according to patient size and/or use of iterative reconstruction  technique. COMPARISON:  08/29/2021. FINDINGS: Cardiovascular: The heart size is normal. No substantial pericardial effusion. Coronary artery calcification is evident. Mild atherosclerotic calcification is noted in the wall of the thoracic aorta. Mediastinum/Nodes: No mediastinal lymphadenopathy. No evidence for gross hilar lymphadenopathy although assessment is limited by the lack of intravenous contrast on the current study. The esophagus has normal imaging features. There is no axillary lymphadenopathy. Lungs/Pleura:  Centrilobular and paraseptal emphysema evident. 5 mm left lower lobe nodule on 95/7 is stable in the interval. The 10 mm new nodule of concern in the subpleural right lower lobe previously has resolved in the interval. No new suspicious pulmonary nodule or mass. Subpleural reticulation again noted bilaterally. No focal airspace consolidation. No pleural effusion. Upper Abdomen: Unremarkable Musculoskeletal: No worrisome lytic or sclerotic osseous abnormality. IMPRESSION: 1. The 10 mm new nodule of concern in the subpleural right lower lobe previously has resolved in the interval. 2. 5 mm left lower lobe pulmonary nodule is stable in the interval. Present since a chest CT of 07/09/2016, this is consistent with benign etiology. 3. Aortic Atherosclerosis (ICD10-I70.0) and Emphysema (ICD10-J43.9). Electronically Signed   By: Misty Stanley M.D.   On: 12/11/2021 13:29    Assessment & Plan:   Problem List Items Addressed This Visit       Cardiovascular and Mediastinum   Essential hypertension    Cont on Coreg, Amlodipine      Relevant Orders   CBC with Differential/Platelet   Comprehensive metabolic panel   Hemoglobin A1c     Respiratory   Cough variant asthma vs upper airway cough syndrome  - Primary    No cough now MDI prn        Endocrine   Diabetes type 2, controlled (HCC)    Continue on Metfomin XR, Actos      Relevant Orders   Hemoglobin A1c     Other   Low back pain     On Aleve prn Tramadol prn rare Try Blue-Emu cream  Potential benefits of a long term opioids use as well as potential risks (i.e. addiction risk, apnea etc) and complications (i.e. Somnolence, constipation and others) were explained to the patient and were aknowledged.      Polycythemia    Check CBC      Other Visit Diagnoses     Bladder neck obstruction       Relevant Orders   PSA         No orders of the defined types were placed in this encounter.     Follow-up: Return in about 4 months (around 10/25/2022) for a follow-up visit.  [name], MD

## 2022-06-26 NOTE — Assessment & Plan Note (Signed)
No cough now MDI prn

## 2022-06-26 NOTE — Assessment & Plan Note (Addendum)
On Aleve prn Tramadol prn rare Try Blue-Emu cream  Potential benefits of a long term opioids use as well as potential risks (i.e. addiction risk, apnea etc) and complications (i.e. Somnolence, constipation and others) were explained to the patient and were aknowledged.

## 2022-06-26 NOTE — Assessment & Plan Note (Signed)
Cont on Coreg, Amlodipine

## 2022-06-26 NOTE — Assessment & Plan Note (Signed)
Check CBC 

## 2022-09-24 ENCOUNTER — Telehealth: Payer: Self-pay | Admitting: Internal Medicine

## 2022-09-24 NOTE — Telephone Encounter (Signed)
Contacted Purcell Nails. to schedule their annual wellness visit. Appointment made for 10/07/2022.  [facility] Care Guide [facility] AWV TEAM Direct Dial: [phone]

## 2022-10-07 ENCOUNTER — Ambulatory Visit (INDEPENDENT_AMBULATORY_CARE_PROVIDER_SITE_OTHER): Payer: PPO

## 2022-10-07 VITALS — Ht 64.0 in | Wt 192.0 lb

## 2022-10-07 DIAGNOSIS — Z0000 Encounter for general adult medical examination without abnormal findings: Secondary | ICD-10-CM | POA: Diagnosis not present

## 2022-10-07 NOTE — Patient Instructions (Addendum)
[name] , Thank you for taking time to come for your Medicare Wellness Visit. I appreciate your ongoing commitment to your health goals. Please review the following plan we discussed and let me know if I can assist you in the future.   These are the goals we discussed:  Goals       Diabetes Patient Goal (pt-stated)      Diabetes: Goal Setting  The 7 areas that you can work on to improve your diabetes are:   Healthy Eating - Decrease portions, eat at regular times, add whole grains,  fruits and vegetables.   Being Active - Increase activity by walking, swimming or biking, take the  stairs, park farther away.   Monitoring - Check blood sugar, record results, keep a journal. Monitoring  can also include weight and blood pressure.   Taking Medicines - Take the right amount at the right time, learn how your  medicine works and what the side effects are.   Problem Solving - Take care of high and low blood sugars, sick days, know  who to call and when.   Reducing Risks - Stop smoking and start preventive care: get dilated eye  exams, foot care, flu and pneumonia shots, and dental care.   Healthy Coping - Know when to ask for help and who you can talk to when  you feel stressed or overwhelmed.  Pick something important to you and break your larger goals down into small  steps that you can really achieve!  Caring for diabetes takes time and commitment, but it's worth it! Keep in mind  that change takes time. Once you choose a goal, break down the steps you will  take this week to reach that goal. Think of these as experiments, or things you  will try to see if they work. Think about why and what you will do differently  next week if something didn't work.  Be patient with yourself and take it one day at a time.        This is a list of the screening recommended for you and due dates:  Health Maintenance  Topic Date Due   Complete foot exam   06/15/2020   Yearly kidney health  urinalysis for diabetes  06/21/2021   COVID-19 Vaccine (4 - 2023-24 season) 01/25/2022   Hemoglobin A1C  12/25/2022   Flu Shot  12/26/2022   Eye exam for diabetics  02/28/2023   Yearly kidney function blood test for diabetes  06/27/2023   Medicare Annual Wellness Visit  10/07/2023   DTaP/Tdap/Td vaccine (3 - Td or Tdap) 10/13/2029   Pneumonia Vaccine  Completed   Hepatitis C Screening: USPSTF Recommendation to screen - Ages 96-79 yo.  Completed   Zoster (Shingles) Vaccine  Completed   HPV Vaccine  Aged Out   Colon Cancer Screening  Discontinued    Advanced directives: Yes; Please bring a copy of your health care power of attorney and living will to the office at your convenience.  Conditions/risks identified: Yes; Type II Diabetes  Next appointment: Follow up in one year for your annual wellness visit.   Preventive Care 73 Years and Older, Male  Preventive care refers to lifestyle choices and visits with your health care provider that can promote health and wellness. What does preventive care include? A yearly physical exam. This is also called an annual well check. Dental exams once or twice a year. Routine eye exams. Ask your health care provider how often you  should have your eyes checked. Personal lifestyle choices, including: Daily care of your teeth and gums. Regular physical activity. Eating a healthy diet. Avoiding tobacco and drug use. Limiting alcohol use. Practicing safe sex. Taking low doses of aspirin every day. Taking vitamin and mineral supplements as recommended by your health care provider. What happens during an annual well check? The services and screenings done by your health care provider during your annual well check will depend on your age, overall health, lifestyle risk factors, and family history of disease. Counseling  Your health care provider may ask you questions about your: Alcohol use. Tobacco use. Drug use. Emotional well-being. Home and  relationship well-being. Sexual activity. Eating habits. History of falls. Memory and ability to understand (cognition). Work and work Astronomer. Screening  You may have the following tests or measurements: Height, weight, and BMI. Blood pressure. Lipid and cholesterol levels. These may be checked every 5 years, or more frequently if you are over 52 years old. Skin check. Lung cancer screening. You may have this screening every year starting at age 84 if you have a 30-pack-year history of smoking and currently smoke or have quit within the past 15 years. Fecal occult blood test (FOBT) of the stool. You may have this test every year starting at age 3. Flexible sigmoidoscopy or colonoscopy. You may have a sigmoidoscopy every 5 years or a colonoscopy every 10 years starting at age 74. Prostate cancer screening. Recommendations will vary depending on your family history and other risks. Hepatitis C blood test. Hepatitis B blood test. Sexually transmitted disease (STD) testing. Diabetes screening. This is done by checking your blood sugar (glucose) after you have not eaten for a while (fasting). You may have this done every 1-3 years. Abdominal aortic aneurysm (AAA) screening. You may need this if you are a current or former smoker. Osteoporosis. You may be screened starting at age 27 if you are at high risk. Talk with your health care provider about your test results, treatment options, and if necessary, the need for more tests. Vaccines  Your health care provider may recommend certain vaccines, such as: Influenza vaccine. This is recommended every year. Tetanus, diphtheria, and acellular pertussis (Tdap, Td) vaccine. You may need a Td booster every 10 years. Zoster vaccine. You may need this after age 48. Pneumococcal 13-valent conjugate (PCV13) vaccine. One dose is recommended after age 63. Pneumococcal polysaccharide (PPSV23) vaccine. One dose is recommended after age 63. Talk to your  health care provider about which screenings and vaccines you need and how often you need them. This information is not intended to replace advice given to you by your health care provider. Make sure you discuss any questions you have with your health care provider. Document Released: 06/09/2015 Document Revised: 01/31/2016 Document Reviewed: 03/14/2015 Elsevier Interactive Patient Education  2017 [employer].  Fall Prevention in the Home Falls can cause injuries. They can happen to people of all ages. There are many things you can do to make your home safe and to help prevent falls. What can I do on the outside of my home? Regularly fix the edges of walkways and driveways and fix any cracks. Remove anything that might make you trip as you walk through a door, such as a raised step or threshold. Trim any bushes or trees on the path to your home. Use bright outdoor lighting. Clear any walking paths of anything that might make someone trip, such as rocks or tools. Regularly check to see if handrails  are loose or broken. Make sure that both sides of any steps have handrails. Any raised decks and porches should have guardrails on the edges. Have any leaves, snow, or ice cleared regularly. Use sand or salt on walking paths during winter. Clean up any spills in your garage right away. This includes oil or grease spills. What can I do in the bathroom? Use night lights. Install grab bars by the toilet and in the tub and shower. Do not use towel bars as grab bars. Use non-skid mats or decals in the tub or shower. If you need to sit down in the shower, use a plastic, non-slip stool. Keep the floor dry. Clean up any water that spills on the floor as soon as it happens. Remove soap buildup in the tub or shower regularly. Attach bath mats securely with double-sided non-slip rug tape. Do not have throw rugs and other things on the floor that can make you trip. What can I do in the bedroom? Use night  lights. Make sure that you have a light by your bed that is easy to reach. Do not use any sheets or blankets that are too big for your bed. They should not hang down onto the floor. Have a firm chair that has side arms. You can use this for support while you get dressed. Do not have throw rugs and other things on the floor that can make you trip. What can I do in the kitchen? Clean up any spills right away. Avoid walking on wet floors. Keep items that you use a lot in easy-to-reach places. If you need to reach something above you, use a strong step stool that has a grab bar. Keep electrical cords out of the way. Do not use floor polish or wax that makes floors slippery. If you must use wax, use non-skid floor wax. Do not have throw rugs and other things on the floor that can make you trip. What can I do with my stairs? Do not leave any items on the stairs. Make sure that there are handrails on both sides of the stairs and use them. Fix handrails that are broken or loose. Make sure that handrails are as long as the stairways. Check any carpeting to make sure that it is firmly attached to the stairs. Fix any carpet that is loose or worn. Avoid having throw rugs at the top or bottom of the stairs. If you do have throw rugs, attach them to the floor with carpet tape. Make sure that you have a light switch at the top of the stairs and the bottom of the stairs. If you do not have them, ask someone to add them for you. What else can I do to help prevent falls? Wear shoes that: Do not have high heels. Have rubber bottoms. Are comfortable and fit you well. Are closed at the toe. Do not wear sandals. If you use a stepladder: Make sure that it is fully opened. Do not climb a closed stepladder. Make sure that both sides of the stepladder are locked into place. Ask someone to hold it for you, if possible. Clearly mark and make sure that you can see: Any grab bars or handrails. First and last  steps. Where the edge of each step is. Use tools that help you move around (mobility aids) if they are needed. These include: Canes. Walkers. Scooters. Crutches. Turn on the lights when you go into a dark area. Replace any light bulbs as soon as they  burn out. Set up your furniture so you have a clear path. Avoid moving your furniture around. If any of your floors are uneven, fix them. If there are any pets around you, be aware of where they are. Review your medicines with your doctor. Some medicines can make you feel dizzy. This can increase your chance of falling. Ask your doctor what other things that you can do to help prevent falls. This information is not intended to replace advice given to you by your health care provider. Make sure you discuss any questions you have with your health care provider. Document Released: 03/09/2009 Document Revised: 10/19/2015 Document Reviewed: 06/17/2014 Elsevier Interactive Patient Education  2017 [employer].

## 2022-10-07 NOTE — Progress Notes (Cosign Needed Addendum)
I connected with  Purcell Nails. on 10/07/22 by a audio enabled telemedicine application and verified that I am speaking with the correct person using two identifiers.  Patient Location: Home  Provider Location: Office/Clinic  I discussed the limitations of evaluation and management by telemedicine. The patient expressed understanding and agreed to proceed.  Patient Medicare AWV questionnaire was completed by the patient on 10/03/2022; I have confirmed that all information answered by patient is correct and no changes since this date.    Subjective:   Chase Robson. is a 77 y.o. male who presents for Medicare Annual/Subsequent preventive examination.  Review of Systems     Cardiac Risk Factors include: advanced age (>107men, >67 women);diabetes mellitus;hypertension;family history of premature cardiovascular disease;male gender;obesity (BMI >30kg/m2)     Objective:    Today's Vitals   10/07/22 1333 10/07/22 1335  Weight: 192 lb (87.1 kg)   Height: 5\' 4"  (1.626 m)   PainSc: 4  4   PainLoc: Back    Body mass index is 32.96 kg/m.     10/07/2022    1:45 PM 12/05/2021   11:34 AM 11/23/2020   11:21 AM 06/12/2020   12:33 PM 10/14/2019    8:26 AM 08/21/2016   10:41 AM 07/18/2011   10:53 AM  Advanced Directives  Does Patient Have a Medical Advance Directive? Yes Yes Yes Yes Yes No Patient does not have advance directive  Type of Advance Directive Healthcare Power of [place];Living will Living will;Healthcare Power of Attorney Living will;Healthcare Power of Attorney Living will;Healthcare Power of [employer] Power of [place];Living will    Does patient want to make changes to medical advance directive?  No - Patient declined No - Patient declined No - Patient declined No - Patient declined    Copy of Healthcare Power of Attorney in Chart? No - copy requested No - copy requested No - copy requested  No - copy requested    Would patient like information on creating a  medical advance directive?      Yes (ED - Information included in AVS)     Current Medications (verified) Outpatient Encounter Medications as of 10/07/2022  Medication Sig   amLODipine (NORVASC) 10 MG tablet TAKE 1 TABLET BY MOUTH DAILY BEFORE BREAKFAST.   aspirin EC 81 MG tablet Take 81 mg by mouth daily.   Blood Glucose Monitoring Suppl (ONETOUCH VERIO) w/Device KIT 1 Units by Does not apply route daily as needed.   carvedilol (COREG) 25 MG tablet Take 1 tablet (25 mg total) by mouth 2 (two) times daily with a meal.   Cholecalciferol (VITAMIN D3) 1000 UNITS tablet Take 2,000 Units by mouth daily.   Coenzyme Q10 (COQ10 MAXIMUM STRENGTH) 400 MG CAPS Take by mouth. Take 1 capsule a day   Folic Acid 0.8 MG CAPS Take 2 capsules by mouth every morning.    glucose blood test strip USE TO TEST BLOOD SUGAR TWICE A DAY   metFORMIN (GLUCOPHAGE-XR) 750 MG 24 hr tablet TAKE 1 TABLET BY MOUTH EVERY DAY WITH BREAKFAST   OneTouch Delica Lancets 33G MISC USE TO CHECK BLOOD SUGAR TWICE DAILY   ONETOUCH VERIO test strip USE TO CHECK BLOOD SUGAR DAILY   pioglitazone (ACTOS) 45 MG tablet Take 1 tablet (45 mg total) by mouth daily.   traMADol (ULTRAM) 50 MG tablet TAKE 0.5-1 TABLETS (25-50 MG TOTAL) BY MOUTH EVERY 6 (SIX) HOURS AS NEEDED (COUGH).   zolpidem (AMBIEN) 10 MG tablet Take 0.5-1 tablets (5-10 mg  total) by mouth at bedtime as needed for sleep.   [DISCONTINUED] hydrOXYzine (ATARAX/VISTARIL) 25 MG tablet TAKE 1-2 TABLETS AT BEDTIME AS NEED FOR INSOMNIA   [DISCONTINUED] mometasone-formoterol (DULERA) 100-5 MCG/ACT AERO Inhale 2 puffs into the lungs in the morning and at bedtime.   No facility-administered encounter medications on file as of 10/07/2022.    Allergies (verified) Olmesartan, Other, Pravastatin, and Symbicort [budesonide-formoterol fumarate]   History: Past Medical History:  Diagnosis Date   Asthma    mild, chronic cough   Chronic kidney disease    kidney stone and infection- started  03-27-11   COPD (chronic obstructive pulmonary disease) (HCC)    Diabetes mellitus    GERD (gastroesophageal reflux disease)    Hyperlipidemia    Hypertension    Obesity    Past Surgical History:  Procedure Laterality Date   APPENDECTOMY  1963   CYSTOSCOPY W/ URETERAL STENT PLACEMENT  03/26/11   removal of kidney stone   MASS EXCISION Left 06/16/2020   Procedure: MINOR EXCISION OF MASS;  Surgeon: Serena Colonel, MD;  Location: [facility];  Service: ENT;  Laterality: Left;   Family History  Problem Relation Age of Onset   Multiple sclerosis Mother    Heart disease Father 45       MI   Diabetes Father    Hypertension Father    Heart disease Sister 2       CAD   Diabetes Sister    Diabetes Brother    Parkinsonism Brother    Hypertension Brother    Hypertension Other    Coronary artery disease Other    Colon cancer Neg Hx    Esophageal cancer Neg Hx    Stomach cancer Neg Hx    Cancer - Colon Neg Hx    Social History   Socioeconomic History   Marital status: Married    Spouse name: Not on file   Number of children: 3   Years of education: Not on file   Highest education level: Master's degree (e.g., MA, MS, MEng, MEd, MSW, MBA)  Occupational History   Occupation: Professional  Tobacco Use   Smoking status: Former    Packs/day: 1.50    Years: 40.00    Additional pack years: 0.00    Total pack years: 60.00    Types: Cigarettes    Quit date: 08/13/2001    Years since quitting: 21.1   Smokeless tobacco: Never   Tobacco comments:    Counseled to remain smoke free.  Vaping Use   Vaping Use: Never used  Substance and Sexual Activity   Alcohol use: Yes    Alcohol/week: 10.0 standard drinks of alcohol    Types: 10 Glasses of wine per week    Comment: 2 glasses   Drug use: No   Sexual activity: Yes  Other Topics Concern   Not on file  Social History Narrative   Regular Exercise -  YES; riding      Social Determinants of Health   Financial  Resource Strain: Low Risk  (10/07/2022)   Overall Financial Resource Strain (CARDIA)    Difficulty of Paying Living Expenses: Not hard at all  Food Insecurity: No Food Insecurity (10/07/2022)   Hunger Vital Sign    Worried About Running Out of Food in the Last Year: Never true    Ran Out of Food in the Last Year: Never true  Transportation Needs: No Transportation Needs (10/07/2022)   PRAPARE - Transportation    Lack of  Transportation (Medical): No    Lack of Transportation (Non-Medical): No  Physical Activity: Patient Declined (10/07/2022)   Exercise Vital Sign    Days of Exercise per Week: Patient declined    Minutes of Exercise per Session: Patient declined  Stress: No Stress Concern Present (10/07/2022)   [employer] of Occupational Health - Occupational Stress Questionnaire    Feeling of Stress : Not at all  Social Connections: Unknown (10/07/2022)   Social Connection and Isolation Panel [NHANES]    Frequency of Communication with Friends and Family: More than three times a week    Frequency of Social Gatherings with Friends and Family: Three times a week    Attends Religious Services: Patient declined    Active Member of Clubs or Organizations: No    Attends Banker Meetings: Never    Marital Status: Married    Tobacco Counseling Counseling given: Not Answered Tobacco comments: Counseled to remain smoke free.   Clinical Intake:  Pre-visit preparation completed: Yes  Pain : 0-10 Pain Score: 4  Pain Type: Chronic pain Pain Location: Back Pain Orientation: Lower     BMI - recorded: 32.96 Nutritional Risks: None Diabetes: No  How often do you need to have someone help you when you read instructions, pamphlets, or other written materials from your doctor or pharmacy?: 1 - Never What is the last grade level you completed in school?: HSG  Nutrition Risk Assessment:  Has the patient had any N/V/D within the last 2 months?  No  Does the patient have  any non-healing wounds?  No  Has the patient had any unintentional weight loss or weight gain?  No   Diabetes:  Is the patient diabetic?  Yes  If diabetic, was a CBG obtained today?  No  Did the patient bring in their glucometer from home?  No  How often do you monitor your CBG's? Daily prn.   Financial Strains and Diabetes Management:  Are you having any financial strains with the device, your supplies or your medication? No .  Does the patient want to be seen by Chronic Care Management for management of their diabetes?  No  Would the patient like to be referred to a Nutritionist or for Diabetic Management?  No   Diabetic Exams:  Diabetic Eye Exam: Completed 02/27/2022 Diabetic Foot Exam: Overdue, Pt has been advised about the importance in completing this exam. Pt is scheduled for diabetic foot exam on 10/09/2022.   Interpreter Needed?: No  Information entered by :: Susie Cassette, LPN.   Activities of Daily Living    10/07/2022    1:46 PM 10/03/2022   12:58 PM  In your present state of health, do you have any difficulty performing the following activities:  Hearing? 1 1  Vision? 1 1  Difficulty concentrating or making decisions? 0 0  Walking or climbing stairs? 0 0  Dressing or bathing? 0 0  Doing errands, shopping? 0 0  Preparing Food and eating ? N N  Using the Toilet? N N  In the past six months, have you accidently leaked urine? N N  Do you have problems with loss of bowel control? N N  Managing your Medications? N N  Managing your Finances? N N  Housekeeping or managing your Housekeeping? N N    Patient Care Team: Plotnikov, Georgina Quint, MD as PCP - General Croitoru, Mihai, MD as Consulting Physician (Cardiology) [place], Heather, OD as Consulting Physician (Optometry)  Indicate any recent Medical Services you may  have received from other than [facility] providers in the past year (date may be approximate).     Assessment:   This is a routine wellness examination  for [place].  Hearing/Vision screen Hearing Screening - Comments:: Patient has difficulty hearing and wears hearing aids. Vision Screening - Comments:: Wears rx glasses - up to date with routine eye exams with [facility]   Dietary issues and exercise activities discussed: Current Exercise Habits: The patient does not participate in regular exercise at present, Exercise limited by: cardiac condition(s);respiratory conditions(s);orthopedic condition(s)   Goals Addressed               This Visit's Progress     Diabetes Patient Goal (pt-stated)        Diabetes: Goal Setting  The 7 areas that you can work on to improve your diabetes are:   Healthy Eating - Decrease portions, eat at regular times, add whole grains,  fruits and vegetables.   Being Active - Increase activity by walking, swimming or biking, take the  stairs, park farther away.   Monitoring - Check blood sugar, record results, keep a journal. Monitoring  can also include weight and blood pressure.   Taking Medicines - Take the right amount at the right time, learn how your  medicine works and what the side effects are.   Problem Solving - Take care of high and low blood sugars, sick days, know  who to call and when.   Reducing Risks - Stop smoking and start preventive care: get dilated eye  exams, foot care, flu and pneumonia shots, and dental care.   Healthy Coping - Know when to ask for help and who you can talk to when  you feel stressed or overwhelmed.  Pick something important to you and break your larger goals down into small  steps that you can really achieve!  Caring for diabetes takes time and commitment, but it's worth it! Keep in mind  that change takes time. Once you choose a goal, break down the steps you will  take this week to reach that goal. Think of these as experiments, or things you  will try to see if they work. Think about why and what you will do differently  next week if something didn't  work.  Be patient with yourself and take it one day at a time.      Depression Screen    10/07/2022    1:44 PM 06/26/2022    8:29 AM 03/25/2022    8:17 AM 12/05/2021   11:40 AM 11/29/2021   10:36 AM 11/23/2020   11:23 AM 06/21/2020    8:57 AM  PHQ 2/9 Scores  PHQ - 2 Score 0 0 0 0 0 0 0  PHQ- 9 Score 0 3 2        Fall Risk    10/07/2022    1:46 PM 10/03/2022   12:58 PM 06/26/2022    8:29 AM 03/25/2022    8:16 AM 12/05/2021   11:35 AM  Fall Risk   Falls in the past year? 0 0 0 0 0  Number falls in past yr: 0  0 0 1  Injury with Fall? 0  0 0 0  Risk for fall due to : No Fall Risks  No Fall Risks No Fall Risks No Fall Risks  Follow up Falls prevention discussed  Falls evaluation completed  Falls evaluation completed    FALL RISK PREVENTION PERTAINING TO THE HOME:  Any stairs  in or around the home? Yes  If so, are there any without handrails? No  Home free of loose throw rugs in walkways, pet beds, electrical cords, etc? Yes  Adequate lighting in your home to reduce risk of falls? Yes   ASSISTIVE DEVICES UTILIZED TO PREVENT FALLS:  Life alert? No  Use of a cane, walker or w/c? No  Grab bars in the bathroom? No  Shower chair or bench in shower? Yes  Elevated toilet seat or a handicapped toilet? No   TIMED UP AND GO:  Was the test performed? No . Telephonic Visit  Cognitive Function:        10/07/2022    1:48 PM 12/05/2021   11:50 AM  6CIT Screen  What Year? 0 points 0 points  What month? 0 points 0 points  What time? 0 points 0 points  Count back from 20 0 points 0 points  Months in reverse 0 points 0 points  Repeat phrase 0 points 0 points  Total Score 0 points 0 points    Immunizations Immunization History  Administered Date(s) Administered   Fluad Quad(high Dose 65+) 02/22/2020, 02/21/2021, 03/25/2022   Influenza Split 02/21/2012   Influenza Whole 02/25/2011   Influenza, High Dose Seasonal PF 03/10/2013, 04/10/2016, 02/19/2017, 03/16/2018, 01/21/2019    Influenza,inj,Quad PF,6+ Mos 03/28/2014, 04/04/2015   PFIZER(Purple Top)SARS-COV-2 Vaccination 07/02/2019, 07/27/2019, 04/18/2020   PNEUMOCOCCAL CONJUGATE-20 11/29/2021   Pneumococcal Conjugate-13 07/23/2013   Pneumococcal Polysaccharide-23 11/13/2009   Td 11/13/2009   Tdap 10/14/2019   Zoster Recombinat (Shingrix) 06/07/2020, 08/16/2020   Zoster, Live 11/20/2009    TDAP status: Up to date  Flu Vaccine status: Up to date  Pneumococcal vaccine status: Up to date  Covid-19 vaccine status: Completed vaccines  Qualifies for Shingles Vaccine? Yes   Zostavax completed Yes   Shingrix Completed?: Yes  Screening Tests Health Maintenance  Topic Date Due   FOOT EXAM  06/15/2020   Diabetic kidney evaluation - Urine ACR  06/21/2021   COVID-19 Vaccine (4 - 2023-24 season) 01/25/2022   HEMOGLOBIN A1C  12/25/2022   INFLUENZA VACCINE  12/26/2022   OPHTHALMOLOGY EXAM  02/28/2023   Diabetic kidney evaluation - eGFR measurement  06/27/2023   Medicare Annual Wellness (AWV)  10/07/2023   DTaP/Tdap/Td (3 - Td or Tdap) 10/13/2029   Pneumonia Vaccine 68+ Years old  Completed   Hepatitis C Screening  Completed   Zoster Vaccines- Shingrix  Completed   HPV VACCINES  Aged Out   COLONOSCOPY (Pts 45-25yrs Insurance coverage will need to be confirmed)  Discontinued    Health Maintenance  Health Maintenance Due  Topic Date Due   FOOT EXAM  06/15/2020   Diabetic kidney evaluation - Urine ACR  06/21/2021   COVID-19 Vaccine (4 - 2023-24 season) 01/25/2022    Colorectal cancer screening: Type of screening: Cologuard. Completed 05/31/2022. Repeat every 3 years  Lung Cancer Screening: (Low Dose CT Chest recommended if Age 3-80 years, 30 pack-year currently smoking OR have quit w/in 15years.) does not qualify.   Lung Cancer Screening Referral: No  Additional Screening:  Hepatitis C Screening: does qualify; Completed: 08/21/2016  Vision Screening: Recommended annual ophthalmology exams for early  detection of glaucoma and other disorders of the eye. Is the patient up to date with their annual eye exam?  Yes  Who is the provider or what is the name of the office in which the patient attends annual eye exams? [facility] If pt is not established with a provider, would they  like to be referred to a provider to establish care? No .   Dental Screening: Recommended annual dental exams for proper oral hygiene  Community Resource Referral / Chronic Care Management: CRR required this visit?  No   CCM required this visit?  No      Plan:     I have personally reviewed and noted the following in the patient's chart:   Medical and social history Use of alcohol, tobacco or illicit drugs  Current medications and supplements including opioid prescriptions. Patient is not currently taking opioid prescriptions. Functional ability and status Nutritional status Physical activity Advanced directives List of other physicians Hospitalizations, surgeries, and ER visits in previous 12 months Vitals Screenings to include cognitive, depression, and falls Referrals and appointments  In addition, I have reviewed and discussed with patient certain preventive protocols, quality metrics, and best practice recommendations. A written personalized care plan for preventive services as well as general preventive health recommendations were provided to patient.     Mickeal Needy, LPN   9/60/[id number]   Nurse Notes:  Normal cognitive status assessed by direct observation via telephone conversation by this Nurse Health Advisor. No abnormalities found.   Medical screening examination/treatment/procedure(s) were performed by non-physician practitioner and as supervising physician I was immediately available for consultation/collaboration.  I agree with above. Jacinta Shoe, MD

## 2022-10-09 ENCOUNTER — Encounter: Payer: Self-pay | Admitting: Internal Medicine

## 2022-10-09 ENCOUNTER — Ambulatory Visit (INDEPENDENT_AMBULATORY_CARE_PROVIDER_SITE_OTHER): Payer: PPO | Admitting: Internal Medicine

## 2022-10-09 VITALS — BP 120/82 | HR 85 | Temp 98.2°F | Ht 64.0 in | Wt 191.0 lb

## 2022-10-09 DIAGNOSIS — I10 Essential (primary) hypertension: Secondary | ICD-10-CM

## 2022-10-09 DIAGNOSIS — E119 Type 2 diabetes mellitus without complications: Secondary | ICD-10-CM

## 2022-10-09 DIAGNOSIS — D751 Secondary polycythemia: Secondary | ICD-10-CM

## 2022-10-09 LAB — COMPREHENSIVE METABOLIC PANEL
ALT: 14 U/L (ref 0–53)
AST: 18 U/L (ref 0–37)
Albumin: 3.9 g/dL (ref 3.5–5.2)
Alkaline Phosphatase: 79 U/L (ref 39–117)
BUN: 14 mg/dL (ref 6–23)
CO2: 30 mEq/L (ref 19–32)
Calcium: 9.1 mg/dL (ref 8.4–10.5)
Chloride: 100 mEq/L (ref 96–112)
Creatinine, Ser: 0.83 mg/dL (ref 0.40–1.50)
GFR: 83.89 mL/min (ref >60.00)
Glucose, Bld: 121 mg/dL — ABNORMAL HIGH (ref 70–99)
Potassium: 4.3 mEq/L (ref 3.5–5.1)
Sodium: 135 mEq/L (ref 135–145)
Total Bilirubin: 0.6 mg/dL (ref 0.2–1.2)
Total Protein: 7.1 g/dL (ref 6.0–8.3)

## 2022-10-09 LAB — HEMOGLOBIN A1C: Hgb A1c MFr Bld: 6.4 % (ref 4.6–6.5)

## 2022-10-09 MED ORDER — OZEMPIC (0.25 OR 0.5 MG/DOSE) 2 MG/3ML ~~LOC~~ SOPN: PEN_INJECTOR | SUBCUTANEOUS | 3 refills | Status: DC

## 2022-10-09 NOTE — Assessment & Plan Note (Addendum)
Monitor CBC periodically

## 2022-10-09 NOTE — Assessment & Plan Note (Signed)
Continue on Metfomin XR, Actos Will start Ozempic

## 2022-10-09 NOTE — Progress Notes (Signed)
Subjective:  Patient ID: Chase Nails., male    DOB: [birth year]  Age: 77 y.o. MRN: [id number]  CC: Follow-up (4 mnth f/u)   HPI Chase Nails. presents for DM, HTN, CAD  Outpatient Medications Prior to Visit  Medication Sig Dispense Refill   amLODipine (NORVASC) 10 MG tablet TAKE 1 TABLET BY MOUTH DAILY BEFORE BREAKFAST. 90 tablet 3   aspirin EC 81 MG tablet Take 81 mg by mouth daily.     Blood Glucose Monitoring Suppl (ONETOUCH VERIO) w/Device KIT 1 Units by Does not apply route daily as needed. 1 kit 0   carvedilol (COREG) 25 MG tablet Take 1 tablet (25 mg total) by mouth 2 (two) times daily with a meal. 180 tablet 3   Cholecalciferol (VITAMIN D3) 1000 UNITS tablet Take 2,000 Units by mouth daily.     Coenzyme Q10 (COQ10 MAXIMUM STRENGTH) 400 MG CAPS Take by mouth. Take 1 capsule a day     Folic Acid 0.8 MG CAPS Take 2 capsules by mouth every morning.  30 each 0   glucose blood test strip USE TO TEST BLOOD SUGAR TWICE A DAY 100 each 3   metFORMIN (GLUCOPHAGE-XR) 750 MG 24 hr tablet TAKE 1 TABLET BY MOUTH EVERY DAY WITH BREAKFAST 90 tablet 1   OneTouch Delica Lancets 33G MISC USE TO CHECK BLOOD SUGAR TWICE DAILY 100 each 3   ONETOUCH VERIO test strip USE TO CHECK BLOOD SUGAR DAILY 100 strip 5   pioglitazone (ACTOS) 45 MG tablet Take 1 tablet (45 mg total) by mouth daily. 90 tablet 3   traMADol (ULTRAM) 50 MG tablet TAKE 0.5-1 TABLETS (25-50 MG TOTAL) BY MOUTH EVERY 6 (SIX) HOURS AS NEEDED (COUGH). 100 tablet 1   zolpidem (AMBIEN) 10 MG tablet Take 0.5-1 tablets (5-10 mg total) by mouth at bedtime as needed for sleep. 30 tablet 5   No facility-administered medications prior to visit.    ROS: Review of Systems  Constitutional:  Negative for appetite change, fatigue and unexpected weight change.  HENT:  Negative for congestion, nosebleeds, sneezing, sore throat and trouble swallowing.   Eyes:  Negative for itching and visual disturbance.  Respiratory:  Negative for  cough.   Cardiovascular:  Negative for chest pain, palpitations and leg swelling.  Gastrointestinal:  Negative for abdominal distention, blood in stool, diarrhea and nausea.  Genitourinary:  Negative for frequency and hematuria.  Musculoskeletal:  Negative for back pain, gait problem, joint swelling and neck pain.  Skin:  Negative for rash.  Neurological:  Negative for dizziness, tremors, speech difficulty and weakness.  Psychiatric/Behavioral:  Negative for agitation, dysphoric mood and sleep disturbance. The patient is not nervous/anxious.     Objective:  BP 120/82 (BP Location: Left Arm, Patient Position: Sitting, Cuff Size: Large)   Pulse 85   Temp 98.2 F (36.8 C) (Oral)   Ht 5\' 4"  (1.626 m)   Wt 191 lb (86.6 kg)   SpO2 93%   BMI 32.79 kg/m   BP Readings from Last 3 Encounters:  10/09/22 120/82  06/26/22 118/68  06/03/22 118/68    Wt Readings from Last 3 Encounters:  10/09/22 191 lb (86.6 kg)  10/07/22 192 lb (87.1 kg)  06/26/22 189 lb (85.7 kg)    Physical Exam Constitutional:      General: He is not in acute distress.    Appearance: He is well-developed. He is obese.     Comments: NAD  Eyes:     Conjunctiva/sclera: Conjunctivae normal.  Pupils: Pupils are equal, round, and reactive to light.  Neck:     Thyroid: No thyromegaly.     Vascular: No JVD.  Cardiovascular:     Rate and Rhythm: Normal rate and regular rhythm.     Heart sounds: Normal heart sounds. No murmur heard.    No friction rub. No gallop.  Pulmonary:     Effort: Pulmonary effort is normal. No respiratory distress.     Breath sounds: Normal breath sounds. No wheezing or rales.  Chest:     Chest wall: No tenderness.  Abdominal:     General: Bowel sounds are normal. There is no distension.     Palpations: Abdomen is soft. There is no mass.     Tenderness: There is no abdominal tenderness. There is no guarding or rebound.  Musculoskeletal:        General: No tenderness. Normal range of  motion.     Cervical back: Normal range of motion.  Lymphadenopathy:     Cervical: No cervical adenopathy.  Skin:    General: Skin is warm and dry.     Findings: No rash.  Neurological:     Mental Status: He is alert and oriented to person, place, and time.     Cranial Nerves: No cranial nerve deficit.     Motor: No abnormal muscle tone.     Coordination: Coordination normal.     Gait: Gait normal.     Deep Tendon Reflexes: Reflexes are normal and symmetric.  Psychiatric:        Behavior: Behavior normal.        Thought Content: Thought content normal.        Judgment: Judgment normal.     Lab Results  Component Value Date   WBC 4.8 06/26/2022   HGB 15.4 06/26/2022   HCT 45.8 06/26/2022   PLT 197.0 06/26/2022   GLUCOSE 124 (H) 06/26/2022   CHOL 207 (H) 11/29/2021   TRIG 81.0 11/29/2021   HDL 85.00 11/29/2021   LDLCALC 106 (H) 11/29/2021   ALT 15 06/26/2022   AST 17 06/26/2022   NA 139 06/26/2022   K 5.0 06/26/2022   CL 103 06/26/2022   CREATININE 0.82 06/26/2022   BUN 18 06/26/2022   CO2 28 06/26/2022   TSH 1.97 11/29/2021   PSA 1.07 06/26/2022   HGBA1C 6.5 06/26/2022   MICROALBUR 1.9 06/21/2020    CT Super D Chest Wo Contrast  Result Date: 12/11/2021 CLINICAL DATA:  Pulmonary nodule. EXAM: CT CHEST WITHOUT CONTRAST TECHNIQUE: Multidetector CT imaging of the chest was performed using thin slice collimation for electromagnetic bronchoscopy planning purposes, without intravenous contrast. RADIATION DOSE REDUCTION: This exam was performed according to the departmental dose-optimization program which includes automated exposure control, adjustment of the mA and/or kV according to patient size and/or use of iterative reconstruction technique. COMPARISON:  08/29/2021. FINDINGS: Cardiovascular: The heart size is normal. No substantial pericardial effusion. Coronary artery calcification is evident. Mild atherosclerotic calcification is noted in the wall of the thoracic aorta.  Mediastinum/Nodes: No mediastinal lymphadenopathy. No evidence for gross hilar lymphadenopathy although assessment is limited by the lack of intravenous contrast on the current study. The esophagus has normal imaging features. There is no axillary lymphadenopathy. Lungs/Pleura: Centrilobular and paraseptal emphysema evident. 5 mm left lower lobe nodule on 95/7 is stable in the interval. The 10 mm new nodule of concern in the subpleural right lower lobe previously has resolved in the interval. No new suspicious pulmonary nodule or mass. Subpleural  reticulation again noted bilaterally. No focal airspace consolidation. No pleural effusion. Upper Abdomen: Unremarkable Musculoskeletal: No worrisome lytic or sclerotic osseous abnormality. IMPRESSION: 1. The 10 mm new nodule of concern in the subpleural right lower lobe previously has resolved in the interval. 2. 5 mm left lower lobe pulmonary nodule is stable in the interval. Present since a chest CT of 07/09/2016, this is consistent with benign etiology. 3. Aortic Atherosclerosis (ICD10-I70.0) and Emphysema (ICD10-J43.9). Electronically Signed   By: Kennith Center M.D.   On: 12/11/2021 13:29    Assessment & Plan:   Problem List Items Addressed This Visit     Diabetes type 2, controlled (HCC) - Primary    Continue on Metfomin XR, Actos Will start Ozempic      Relevant Medications   Semaglutide,0.25 or 0.5MG /DOS, (OZEMPIC, 0.25 OR 0.5 MG/DOSE,) 2 MG/3ML SOPN   Other Relevant Orders   Comprehensive metabolic panel   Hemoglobin A1c   Essential hypertension    Cont on Coreg, Amlodipine      Polycythemia    Monitor CBC periodically         Meds ordered this encounter  Medications   Semaglutide,0.25 or 0.5MG /DOS, (OZEMPIC, 0.25 OR 0.5 MG/DOSE,) 2 MG/3ML SOPN    Sig: Use 0.25 mg weekly sq for 1 month, then 0.5 mg sq weekly    Dispense:  9 mL    Refill:  3      Follow-up: Return in about 3 months (around 01/09/2023) for a follow-up  visit.  Sonda Primes, MD

## 2022-10-09 NOTE — Assessment & Plan Note (Signed)
Cont on Coreg, Amlodipine ?

## 2022-10-10 ENCOUNTER — Encounter: Payer: Self-pay | Admitting: Internal Medicine

## 2022-10-10 NOTE — Telephone Encounter (Signed)
Pt need PA on Ozempic. Submitted w/(Key: [id number]) PA information has been sent to [employer].Marland KitchenRaechel Chute

## 2022-10-11 ENCOUNTER — Encounter: Payer: Self-pay | Admitting: Internal Medicine

## 2022-10-11 NOTE — Telephone Encounter (Signed)
Pt responding to recent lab work showing glucose at 121 and is concerned about it showing that it is too high

## 2022-10-15 ENCOUNTER — Other Ambulatory Visit: Payer: Self-pay | Admitting: Internal Medicine

## 2022-10-18 ENCOUNTER — Other Ambulatory Visit: Payer: Self-pay | Admitting: Internal Medicine

## 2022-10-18 MED ORDER — ZOLPIDEM TARTRATE 10 MG PO TABS: 5.0000 mg | ORAL_TABLET | Freq: Every evening | ORAL | 0 refills | Status: DC | PRN

## 2022-10-18 NOTE — Telephone Encounter (Signed)
Refilled maintenance meds pls advise on Zolpidem.Marland KitchenRaechel Chute

## 2022-11-08 ENCOUNTER — Other Ambulatory Visit: Payer: Self-pay | Admitting: Internal Medicine

## 2022-11-11 ENCOUNTER — Encounter: Payer: Self-pay | Admitting: Internal Medicine

## 2022-11-13 ENCOUNTER — Other Ambulatory Visit: Payer: Self-pay | Admitting: Internal Medicine

## 2022-11-13 ENCOUNTER — Encounter: Payer: Self-pay | Admitting: Internal Medicine

## 2022-11-13 MED ORDER — TEMAZEPAM 15 MG PO CAPS: 15.0000 mg | ORAL_CAPSULE | Freq: Every evening | ORAL | 3 refills | Status: AC | PRN

## 2022-11-25 ENCOUNTER — Encounter: Payer: Self-pay | Admitting: Internal Medicine

## 2022-11-25 NOTE — Telephone Encounter (Signed)
[employer] calling need more information on the increase of Temazepam. Plans only cover 30 day. Gave information that pt was previously on Zolpidem was not effective. Pt request change so MD rx Temazepam. She also requesting notes to be sent [phone]. Sent last msg and last ov.Marland KitchenRaechel Chute

## 2022-11-26 NOTE — Telephone Encounter (Signed)
Rec'd form requesting additional information for the Temazepam from [employer]. Answer ? And faxed back to [phone]! ID # [id number].Marland KitchenRaechel Chute

## 2022-11-28 ENCOUNTER — Encounter: Payer: Self-pay | Admitting: Internal Medicine

## 2022-11-29 DIAGNOSIS — H35363 Drusen (degenerative) of macula, bilateral: Secondary | ICD-10-CM | POA: Diagnosis not present

## 2022-11-29 DIAGNOSIS — H04123 Dry eye syndrome of bilateral lacrimal glands: Secondary | ICD-10-CM | POA: Diagnosis not present

## 2022-12-01 ENCOUNTER — Other Ambulatory Visit: Payer: Self-pay | Admitting: Internal Medicine

## 2022-12-25 ENCOUNTER — Encounter (INDEPENDENT_AMBULATORY_CARE_PROVIDER_SITE_OTHER): Payer: Self-pay

## 2023-01-02 DIAGNOSIS — H04122 Dry eye syndrome of left lacrimal gland: Secondary | ICD-10-CM | POA: Insufficient documentation

## 2023-01-02 DIAGNOSIS — H11139 Conjunctival pigmentations, unspecified eye: Secondary | ICD-10-CM | POA: Insufficient documentation

## 2023-01-02 DIAGNOSIS — D3190 Benign neoplasm of unspecified part of unspecified eye: Secondary | ICD-10-CM | POA: Insufficient documentation

## 2023-01-02 DIAGNOSIS — H35369 Drusen (degenerative) of macula, unspecified eye: Secondary | ICD-10-CM | POA: Insufficient documentation

## 2023-01-02 DIAGNOSIS — H3122 Choroidal dystrophy (central areolar) (generalized) (peripapillary): Secondary | ICD-10-CM | POA: Insufficient documentation

## 2023-01-08 ENCOUNTER — Ambulatory Visit: Payer: PPO | Admitting: Internal Medicine

## 2023-01-09 DIAGNOSIS — H02834 Dermatochalasis of left upper eyelid: Secondary | ICD-10-CM | POA: Diagnosis not present

## 2023-01-09 DIAGNOSIS — H02535 Eyelid retraction left lower eyelid: Secondary | ICD-10-CM | POA: Diagnosis not present

## 2023-01-09 DIAGNOSIS — H02423 Myogenic ptosis of bilateral eyelids: Secondary | ICD-10-CM | POA: Diagnosis not present

## 2023-01-09 DIAGNOSIS — H02831 Dermatochalasis of right upper eyelid: Secondary | ICD-10-CM | POA: Diagnosis not present

## 2023-01-09 DIAGNOSIS — Z01818 Encounter for other preprocedural examination: Secondary | ICD-10-CM | POA: Diagnosis not present

## 2023-01-09 DIAGNOSIS — H02132 Senile ectropion of right lower eyelid: Secondary | ICD-10-CM | POA: Diagnosis not present

## 2023-01-09 DIAGNOSIS — H02421 Myogenic ptosis of right eyelid: Secondary | ICD-10-CM | POA: Diagnosis not present

## 2023-01-09 DIAGNOSIS — H0289 Other specified disorders of eyelid: Secondary | ICD-10-CM | POA: Diagnosis not present

## 2023-01-09 DIAGNOSIS — H11131 Conjunctival pigmentations, right eye: Secondary | ICD-10-CM | POA: Diagnosis not present

## 2023-01-09 DIAGNOSIS — H0279 Other degenerative disorders of eyelid and periocular area: Secondary | ICD-10-CM | POA: Diagnosis not present

## 2023-01-09 DIAGNOSIS — H02135 Senile ectropion of left lower eyelid: Secondary | ICD-10-CM | POA: Diagnosis not present

## 2023-01-09 DIAGNOSIS — H02422 Myogenic ptosis of left eyelid: Secondary | ICD-10-CM | POA: Diagnosis not present

## 2023-01-10 ENCOUNTER — Encounter: Payer: Self-pay | Admitting: Internal Medicine

## 2023-01-16 ENCOUNTER — Encounter: Payer: Self-pay | Admitting: Internal Medicine

## 2023-01-16 ENCOUNTER — Ambulatory Visit (INDEPENDENT_AMBULATORY_CARE_PROVIDER_SITE_OTHER): Payer: PPO | Admitting: Internal Medicine

## 2023-01-16 VITALS — BP 110/70 | HR 68 | Temp 98.6°F | Ht 64.0 in | Wt 187.0 lb

## 2023-01-16 DIAGNOSIS — Z7985 Long-term (current) use of injectable non-insulin antidiabetic drugs: Secondary | ICD-10-CM

## 2023-01-16 DIAGNOSIS — Z7984 Long term (current) use of oral hypoglycemic drugs: Secondary | ICD-10-CM | POA: Diagnosis not present

## 2023-01-16 DIAGNOSIS — I10 Essential (primary) hypertension: Secondary | ICD-10-CM | POA: Diagnosis not present

## 2023-01-16 DIAGNOSIS — I2584 Coronary atherosclerosis due to calcified coronary lesion: Secondary | ICD-10-CM

## 2023-01-16 DIAGNOSIS — I2510 Atherosclerotic heart disease of native coronary artery without angina pectoris: Secondary | ICD-10-CM

## 2023-01-16 DIAGNOSIS — E785 Hyperlipidemia, unspecified: Secondary | ICD-10-CM | POA: Diagnosis not present

## 2023-01-16 DIAGNOSIS — G8929 Other chronic pain: Secondary | ICD-10-CM

## 2023-01-16 DIAGNOSIS — E119 Type 2 diabetes mellitus without complications: Secondary | ICD-10-CM | POA: Diagnosis not present

## 2023-01-16 LAB — COMPREHENSIVE METABOLIC PANEL
ALT: 15 U/L (ref 0–53)
AST: 18 U/L (ref 0–37)
Albumin: 3.8 g/dL (ref 3.5–5.2)
Alkaline Phosphatase: 82 U/L (ref 39–117)
BUN: 14 mg/dL (ref 6–23)
CO2: 31 mEq/L (ref 19–32)
Calcium: 9.2 mg/dL (ref 8.4–10.5)
Chloride: 102 mEq/L (ref 96–112)
Creatinine, Ser: 0.94 mg/dL (ref 0.40–1.50)
GFR: 77.55 mL/min (ref >60.00)
Glucose, Bld: 118 mg/dL — ABNORMAL HIGH (ref 70–99)
Potassium: 4.5 mEq/L (ref 3.5–5.1)
Sodium: 138 mEq/L (ref 135–145)
Total Bilirubin: 0.6 mg/dL (ref 0.2–1.2)
Total Protein: 6.8 g/dL (ref 6.0–8.3)

## 2023-01-16 LAB — HEMOGLOBIN A1C: Hgb A1c MFr Bld: 6.1 % (ref 4.6–6.5)

## 2023-01-16 NOTE — Assessment & Plan Note (Addendum)
Resolved On Aleve prn Tramadol prn rare Try Blue-Emu cream  Potential benefits of a long term opioids use as well as potential risks (i.e. addiction risk, apnea etc) and complications (i.e. Somnolence, constipation and others) were explained to the patient and were aknowledged.

## 2023-01-16 NOTE — Assessment & Plan Note (Signed)
Cont on Coreg, Amlodipine 

## 2023-01-16 NOTE — Assessment & Plan Note (Signed)
trial of Pitavastatin, Vascepa 

## 2023-01-16 NOTE — Progress Notes (Signed)
Subjective:  Patient ID: Chase Nails., male    DOB: [birth year]  Age: 77 y.o. MRN: [id number]  CC: Follow-up (3 mnth f/u)   HPI Chase Nails. presents for DM, HTN, obesity Doing well on Ozempic 0.5 mg/wk No cough   Outpatient Medications Prior to Visit  Medication Sig Dispense Refill   amLODipine (NORVASC) 10 MG tablet TAKE 1 TABLET BY MOUTH EVERY DAY BEFORE BREAKFAST 90 tablet 3   aspirin EC 81 MG tablet Take 81 mg by mouth daily.     Blood Glucose Monitoring Suppl (ONETOUCH VERIO) w/Device KIT 1 Units by Does not apply route daily as needed. 1 kit 0   carvedilol (COREG) 25 MG tablet Take 1 tablet (25 mg total) by mouth 2 (two) times daily with a meal. 180 tablet 3   Cholecalciferol (VITAMIN D3) 1000 UNITS tablet Take 2,000 Units by mouth daily.     Coenzyme Q10 (COQ10 MAXIMUM STRENGTH) 400 MG CAPS Take by mouth. Take 1 capsule a day     Folic Acid 0.8 MG CAPS Take 2 capsules by mouth every morning.  30 each 0   glucose blood test strip USE TO TEST BLOOD SUGAR TWICE A DAY 100 each 3   metFORMIN (GLUCOPHAGE-XR) 750 MG 24 hr tablet TAKE 1 TABLET BY MOUTH DAILY WITH BREAKFAST. ANNUAL APPT DUE IN JULY MUST SEE PROVIDER FOR REFILLS 90 tablet 0   OneTouch Delica Lancets 33G MISC USE TO CHECK BLOOD SUGAR TWICE DAILY 100 each 3   ONETOUCH VERIO test strip USE TO CHECK BLOOD SUGAR DAILY 100 strip 5   pioglitazone (ACTOS) 45 MG tablet Take 1 tablet (45 mg total) by mouth daily. Follow-appt due in July must see provider for future refills 90 tablet 3   Semaglutide,0.25 or 0.5MG /DOS, (OZEMPIC, 0.25 OR 0.5 MG/DOSE,) 2 MG/3ML SOPN Use 0.25 mg weekly sq for 1 month, then 0.5 mg sq weekly 9 mL 3   temazepam (RESTORIL) 15 MG capsule Take 1-2 capsules (15-30 mg total) by mouth at bedtime as needed for sleep. 60 capsule 3   traMADol (ULTRAM) 50 MG tablet TAKE 0.5-1 TABLETS (25-50 MG TOTAL) BY MOUTH EVERY 6 (SIX) HOURS AS NEEDED (COUGH). 100 tablet 1   zolpidem (AMBIEN) 10 MG tablet Take  5-10 mg by mouth at bedtime as needed.     No facility-administered medications prior to visit.    ROS: Review of Systems  Constitutional:  Negative for appetite change, fatigue and unexpected weight change.  HENT:  Negative for congestion, nosebleeds, sneezing, sore throat and trouble swallowing.   Eyes:  Negative for itching and visual disturbance.  Respiratory:  Negative for cough.   Cardiovascular:  Negative for chest pain, palpitations and leg swelling.  Gastrointestinal:  Negative for abdominal distention, blood in stool, diarrhea and nausea.  Genitourinary:  Negative for frequency and hematuria.  Musculoskeletal:  Negative for back pain, gait problem, joint swelling and neck pain.  Skin:  Negative for rash.  Neurological:  Negative for dizziness, tremors, speech difficulty and weakness.  Psychiatric/Behavioral:  Negative for agitation, dysphoric mood and sleep disturbance. The patient is not nervous/anxious.     Objective:  BP 110/70 (BP Location: Right Arm, Patient Position: Sitting, Cuff Size: Large)   Pulse 68   Temp 98.6 F (37 C) (Oral)   Ht 5\' 4"  (1.626 m)   Wt 187 lb (84.8 kg)   SpO2 93%   BMI 32.10 kg/m   BP Readings from Last 3 Encounters:  01/16/23 110/70  10/09/22 120/82  06/26/22 118/68    Wt Readings from Last 3 Encounters:  01/16/23 187 lb (84.8 kg)  10/09/22 191 lb (86.6 kg)  10/07/22 192 lb (87.1 kg)    Physical Exam Constitutional:      General: He is not in acute distress.    Appearance: He is well-developed.     Comments: NAD  Eyes:     Conjunctiva/sclera: Conjunctivae normal.     Pupils: Pupils are equal, round, and reactive to light.  Neck:     Thyroid: No thyromegaly.     Vascular: No JVD.  Cardiovascular:     Rate and Rhythm: Normal rate and regular rhythm.     Heart sounds: Normal heart sounds. No murmur heard.    No friction rub. No gallop.  Pulmonary:     Effort: Pulmonary effort is normal. No respiratory distress.      Breath sounds: Normal breath sounds. No wheezing or rales.  Chest:     Chest wall: No tenderness.  Abdominal:     General: Bowel sounds are normal. There is no distension.     Palpations: Abdomen is soft. There is no mass.     Tenderness: There is no abdominal tenderness. There is no guarding or rebound.  Musculoskeletal:        General: No tenderness. Normal range of motion.     Cervical back: Normal range of motion.  Lymphadenopathy:     Cervical: No cervical adenopathy.  Skin:    General: Skin is warm and dry.     Findings: No rash.  Neurological:     Mental Status: He is alert and oriented to person, place, and time.     Cranial Nerves: No cranial nerve deficit.     Motor: No abnormal muscle tone.     Coordination: Coordination normal.     Gait: Gait normal.     Deep Tendon Reflexes: Reflexes are normal and symmetric.  Psychiatric:        Behavior: Behavior normal.        Thought Content: Thought content normal.        Judgment: Judgment normal.     Lab Results  Component Value Date   WBC 4.8 06/26/2022   HGB 15.4 06/26/2022   HCT 45.8 06/26/2022   PLT 197.0 06/26/2022   GLUCOSE 121 (H) 10/09/2022   CHOL 207 (H) 11/29/2021   TRIG 81.0 11/29/2021   HDL 85.00 11/29/2021   LDLCALC 106 (H) 11/29/2021   ALT 14 10/09/2022   AST 18 10/09/2022   NA 135 10/09/2022   K 4.3 10/09/2022   CL 100 10/09/2022   CREATININE 0.83 10/09/2022   BUN 14 10/09/2022   CO2 30 10/09/2022   TSH 1.97 11/29/2021   PSA 1.07 06/26/2022   HGBA1C 6.4 10/09/2022   MICROALBUR 1.9 06/21/2020    CT Super D Chest Wo Contrast  Result Date: 12/11/2021 CLINICAL DATA:  Pulmonary nodule. EXAM: CT CHEST WITHOUT CONTRAST TECHNIQUE: Multidetector CT imaging of the chest was performed using thin slice collimation for electromagnetic bronchoscopy planning purposes, without intravenous contrast. RADIATION DOSE REDUCTION: This exam was performed according to the departmental dose-optimization program which  includes automated exposure control, adjustment of the mA and/or kV according to patient size and/or use of iterative reconstruction technique. COMPARISON:  08/29/2021. FINDINGS: Cardiovascular: The heart size is normal. No substantial pericardial effusion. Coronary artery calcification is evident. Mild atherosclerotic calcification is noted in the wall of the thoracic aorta. Mediastinum/Nodes: No mediastinal lymphadenopathy. No  evidence for gross hilar lymphadenopathy although assessment is limited by the lack of intravenous contrast on the current study. The esophagus has normal imaging features. There is no axillary lymphadenopathy. Lungs/Pleura: Centrilobular and paraseptal emphysema evident. 5 mm left lower lobe nodule on 95/7 is stable in the interval. The 10 mm new nodule of concern in the subpleural right lower lobe previously has resolved in the interval. No new suspicious pulmonary nodule or mass. Subpleural reticulation again noted bilaterally. No focal airspace consolidation. No pleural effusion. Upper Abdomen: Unremarkable Musculoskeletal: No worrisome lytic or sclerotic osseous abnormality. IMPRESSION: 1. The 10 mm new nodule of concern in the subpleural right lower lobe previously has resolved in the interval. 2. 5 mm left lower lobe pulmonary nodule is stable in the interval. Present since a chest CT of 07/09/2016, this is consistent with benign etiology. 3. Aortic Atherosclerosis (ICD10-I70.0) and Emphysema (ICD10-J43.9). Electronically Signed   By: Kennith Center M.D.   On: 12/11/2021 13:29    Assessment & Plan:   Problem List Items Addressed This Visit     Dyslipidemia     trial of Pitavastatin, Vascepa      Diabetes type 2, controlled (HCC) - Primary     On Ozempic, Metfomin XR, Actos      Relevant Orders   Comprehensive metabolic panel   Hemoglobin A1c   Low back pain    Resolved On Aleve prn Tramadol prn rare Try Blue-Emu cream  Potential benefits of a long term opioids  use as well as potential risks (i.e. addiction risk, apnea etc) and complications (i.e. Somnolence, constipation and others) were explained to the patient and were aknowledged.      Essential hypertension    Cont on Coreg, Amlodipine      Coronary artery calcification    On Pravachol         No orders of the defined types were placed in this encounter.     Follow-up: Return in about 3 months (around 04/18/2023) for a follow-up visit.  Sonda Primes, MD

## 2023-01-16 NOTE — Assessment & Plan Note (Signed)
On Ozempic, Metfomin XR, Actos

## 2023-01-16 NOTE — Assessment & Plan Note (Signed)
On Pravachol 

## 2023-02-04 DIAGNOSIS — H11133 Conjunctival pigmentations, bilateral: Secondary | ICD-10-CM | POA: Diagnosis not present

## 2023-02-04 DIAGNOSIS — H11131 Conjunctival pigmentations, right eye: Secondary | ICD-10-CM | POA: Diagnosis not present

## 2023-02-05 DIAGNOSIS — H02135 Senile ectropion of left lower eyelid: Secondary | ICD-10-CM | POA: Diagnosis not present

## 2023-02-05 DIAGNOSIS — H16292 Other keratoconjunctivitis, left eye: Secondary | ICD-10-CM | POA: Diagnosis not present

## 2023-02-05 DIAGNOSIS — H02134 Senile ectropion of left upper eyelid: Secondary | ICD-10-CM | POA: Diagnosis not present

## 2023-02-05 DIAGNOSIS — H04522 Eversion of left lacrimal punctum: Secondary | ICD-10-CM | POA: Diagnosis not present

## 2023-02-05 DIAGNOSIS — H04222 Epiphora due to insufficient drainage, left lacrimal gland: Secondary | ICD-10-CM | POA: Diagnosis not present

## 2023-02-05 DIAGNOSIS — H16212 Exposure keratoconjunctivitis, left eye: Secondary | ICD-10-CM | POA: Diagnosis not present

## 2023-02-07 ENCOUNTER — Encounter: Payer: Self-pay | Admitting: Internal Medicine

## 2023-02-20 DIAGNOSIS — I2510 Atherosclerotic heart disease of native coronary artery without angina pectoris: Secondary | ICD-10-CM | POA: Diagnosis not present

## 2023-02-20 DIAGNOSIS — Z888 Allergy status to other drugs, medicaments and biological substances status: Secondary | ICD-10-CM | POA: Diagnosis not present

## 2023-02-20 DIAGNOSIS — E119 Type 2 diabetes mellitus without complications: Secondary | ICD-10-CM | POA: Diagnosis not present

## 2023-02-20 DIAGNOSIS — I10 Essential (primary) hypertension: Secondary | ICD-10-CM | POA: Diagnosis not present

## 2023-02-20 DIAGNOSIS — H11131 Conjunctival pigmentations, right eye: Secondary | ICD-10-CM | POA: Diagnosis not present

## 2023-02-20 DIAGNOSIS — Z79899 Other long term (current) drug therapy: Secondary | ICD-10-CM | POA: Diagnosis not present

## 2023-02-21 ENCOUNTER — Other Ambulatory Visit: Payer: Self-pay | Admitting: Internal Medicine

## 2023-02-24 ENCOUNTER — Other Ambulatory Visit: Payer: Self-pay | Admitting: Internal Medicine

## 2023-02-26 ENCOUNTER — Other Ambulatory Visit: Payer: Self-pay

## 2023-02-26 ENCOUNTER — Telehealth: Payer: Self-pay | Admitting: Internal Medicine

## 2023-02-26 MED ORDER — ONETOUCH DELICA LANCETS 33G MISC: 3 refills | Status: AC

## 2023-02-26 NOTE — Telephone Encounter (Signed)
Prescription Request  02/26/2023  LOV: 01/16/2023  What is the name of the medication or equipment? One touch delica lancets  Have you contacted your pharmacy to request a refill? Yes   Which pharmacy would you like this sent to?  [id number] - [place], [place] - [address] [place] [address] Phone: [phone] Fax: [phone]    Patient notified that their request is being sent to the clinical staff for review and that they should receive a response within 2 business days.   Please advise at Mobile [phone] (mobile)

## 2023-03-11 ENCOUNTER — Telehealth: Payer: Self-pay | Admitting: Internal Medicine

## 2023-03-11 NOTE — Telephone Encounter (Signed)
Okay.  Thanks.

## 2023-03-11 NOTE — Telephone Encounter (Signed)
Prescription Request  03/11/2023  LOV: 01/16/2023  What is the name of the medication or equipment? Ozempic - patient wants to move to the 1 mg. That Dr. Posey Rea discussed with him   Have you contacted your pharmacy to request a refill? Yes   Which pharmacy would you like this sent to?  [id number] - [place], [place] - [address] [place] [address] Phone: [phone] Fax: [phone]    Patient notified that their request is being sent to the clinical staff for review and that they should receive a response within 2 business days.   Please advise at Mobile [phone] (mobile)

## 2023-03-12 ENCOUNTER — Other Ambulatory Visit: Payer: Self-pay | Admitting: Internal Medicine

## 2023-03-12 MED ORDER — SEMAGLUTIDE (1 MG/DOSE) 4 MG/3ML ~~LOC~~ SOPN: 1.0000 mg | PEN_INJECTOR | SUBCUTANEOUS | 3 refills | Status: DC

## 2023-03-13 ENCOUNTER — Encounter: Payer: Self-pay | Admitting: Internal Medicine

## 2023-03-14 ENCOUNTER — Other Ambulatory Visit: Payer: Self-pay

## 2023-03-14 MED ORDER — SEMAGLUTIDE (1 MG/DOSE) 4 MG/3ML ~~LOC~~ SOPN: 1.0000 mg | PEN_INJECTOR | SUBCUTANEOUS | 3 refills | Status: DC

## 2023-04-21 ENCOUNTER — Ambulatory Visit (INDEPENDENT_AMBULATORY_CARE_PROVIDER_SITE_OTHER): Payer: PPO | Admitting: Internal Medicine

## 2023-04-21 ENCOUNTER — Encounter: Payer: Self-pay | Admitting: Internal Medicine

## 2023-04-21 VITALS — BP 118/70 | HR 70 | Temp 98.2°F | Ht 64.0 in | Wt 186.0 lb

## 2023-04-21 DIAGNOSIS — H18011 Anterior corneal pigmentations, right eye: Secondary | ICD-10-CM

## 2023-04-21 DIAGNOSIS — I10 Essential (primary) hypertension: Secondary | ICD-10-CM | POA: Diagnosis not present

## 2023-04-21 DIAGNOSIS — E119 Type 2 diabetes mellitus without complications: Secondary | ICD-10-CM | POA: Diagnosis not present

## 2023-04-21 DIAGNOSIS — H1589 Other disorders of sclera: Secondary | ICD-10-CM | POA: Diagnosis not present

## 2023-04-21 DIAGNOSIS — I2510 Atherosclerotic heart disease of native coronary artery without angina pectoris: Secondary | ICD-10-CM | POA: Diagnosis not present

## 2023-04-21 DIAGNOSIS — E785 Hyperlipidemia, unspecified: Secondary | ICD-10-CM

## 2023-04-21 DIAGNOSIS — F5101 Primary insomnia: Secondary | ICD-10-CM | POA: Diagnosis not present

## 2023-04-21 LAB — COMPREHENSIVE METABOLIC PANEL
ALT: 12 U/L (ref 0–53)
AST: 14 U/L (ref 0–37)
Albumin: 3.9 g/dL (ref 3.5–5.2)
Alkaline Phosphatase: 82 U/L (ref 39–117)
BUN: 14 mg/dL (ref 6–23)
CO2: 29 meq/L (ref 19–32)
Calcium: 9.2 mg/dL (ref 8.4–10.5)
Chloride: 104 meq/L (ref 96–112)
Creatinine, Ser: 0.88 mg/dL (ref 0.40–1.50)
GFR: 82.11 mL/min (ref >60.00)
Glucose, Bld: 105 mg/dL — ABNORMAL HIGH (ref 70–99)
Potassium: 4.4 meq/L (ref 3.5–5.1)
Sodium: 140 meq/L (ref 135–145)
Total Bilirubin: 0.6 mg/dL (ref 0.2–1.2)
Total Protein: 6.5 g/dL (ref 6.0–8.3)

## 2023-04-21 LAB — MICROALBUMIN / CREATININE URINE RATIO
Creatinine,U: 175.7 mg/dL
Microalb Creat Ratio: 0.7 mg/g (ref 0.0–30.0)
Microalb, Ur: 1.2 mg/dL (ref 0.0–1.9)

## 2023-04-21 LAB — HEMOGLOBIN A1C: Hgb A1c MFr Bld: 6 % (ref 4.6–6.5)

## 2023-04-21 NOTE — Assessment & Plan Note (Signed)
Mega red was d/c'd by Pulmonology

## 2023-04-21 NOTE — Assessment & Plan Note (Signed)
Cont on Coreg, Amlodipine ?

## 2023-04-21 NOTE — Assessment & Plan Note (Addendum)
On Ozempic 1 mg/wk, Metfomin XR, Actos

## 2023-04-21 NOTE — Assessment & Plan Note (Addendum)
R  eye Dr. Steele Sizer, MD -right primary acquired melanosis. Dr Pearletha Furl - [facility] S/p bx 2024

## 2023-04-21 NOTE — Assessment & Plan Note (Signed)
Start Zolpidem  Potential benefits of a long term benzodiazepines  use as well as potential risks  and complications were explained to the patient and were aknowledged.

## 2023-04-21 NOTE — Assessment & Plan Note (Addendum)
Off Pitavastatin, Vascepa Mega red was d/c'd by Ophthalmology

## 2023-04-21 NOTE — Progress Notes (Signed)
Subjective:  Patient ID: [name]., male    DOB: 1945-10-29  Age: 77 y.o. MRN: [id number]  CC: Medical Management of Chronic Issues (3 mnth f/u)   HPI [name]. presents for DM, HTN, CAD   Outpatient Medications Prior to Visit  Medication Sig Dispense Refill   ALPRAZolam (XANAX) 0.5 MG tablet Take 0.5 mg by mouth at bedtime as needed.     amLODipine (NORVASC) 10 MG tablet TAKE 1 TABLET BY MOUTH EVERY DAY BEFORE BREAKFAST 90 tablet 3   aspirin EC 81 MG tablet Take 81 mg by mouth daily.     Blood Glucose Monitoring Suppl (ONETOUCH VERIO) w/Device KIT 1 Units by Does not apply route daily as needed. 1 kit 0   budesonide-formoterol (SYMBICORT) 80-4.5 MCG/ACT inhaler Inhale 2 puffs into the lungs in the morning and at bedtime.     carvedilol (COREG) 25 MG tablet TAKE 1 TABLET (25 MG TOTAL) BY MOUTH TWICE A DAY WITH MEALS 180 tablet 3   Cholecalciferol (VITAMIN D3) 1000 UNITS tablet Take 2,000 Units by mouth daily.     Coenzyme Q10 (COQ10 MAXIMUM STRENGTH) 400 MG CAPS Take by mouth. Take 1 capsule a day     Folic Acid 0.8 MG CAPS Take 2 capsules by mouth every morning.  30 each 0   glucose blood test strip USE TO TEST BLOOD SUGAR TWICE A DAY 100 each 3   metFORMIN (GLUCOPHAGE-XR) 750 MG 24 hr tablet TAKE 1 TABLET BY MOUTH DAILY WITH BREAKFAST. ANNUAL APPT DUE IN JULY MUST SEE PROVIDER FOR REFILLS 90 tablet 0   Multiple Vitamins-Minerals (PRESERVISION AREDS PO)      OneTouch Delica Lancets 33G MISC USE TO CHECK BLOOD SUGAR TWICE DAILY 100 each 3   ONETOUCH VERIO test strip USE TO CHECK BLOOD SUGAR DAILY 100 strip 5   pioglitazone (ACTOS) 45 MG tablet Take 1 tablet (45 mg total) by mouth daily. Follow-appt due in July must see provider for future refills 90 tablet 3   Semaglutide, 1 MG/DOSE, 4 MG/3ML SOPN Inject 1 mg as directed once a week. 9 mL 3   temazepam (RESTORIL) 15 MG capsule Take 1-2 capsules (15-30 mg total) by mouth at bedtime as needed for sleep. 60 capsule 3    traMADol (ULTRAM) 50 MG tablet TAKE 0.5-1 TABLETS (25-50 MG TOTAL) BY MOUTH EVERY 6 (SIX) HOURS AS NEEDED (COUGH). 100 tablet 1   zolpidem (AMBIEN) 10 MG tablet Take 5-10 mg by mouth at bedtime as needed.     No facility-administered medications prior to visit.    ROS: Review of Systems  Constitutional:  Negative for appetite change, fatigue and unexpected weight change.  HENT:  Negative for congestion, nosebleeds, sneezing, sore throat and trouble swallowing.   Eyes:  Negative for itching and visual disturbance.  Respiratory:  Negative for cough.   Cardiovascular:  Negative for chest pain, palpitations and leg swelling.  Gastrointestinal:  Negative for abdominal distention, blood in stool, diarrhea and nausea.  Genitourinary:  Negative for frequency and hematuria.  Musculoskeletal:  Negative for back pain, gait problem, joint swelling and neck pain.  Skin:  Negative for rash.  Neurological:  Negative for dizziness, tremors, speech difficulty and weakness.  Psychiatric/Behavioral:  Negative for agitation, dysphoric mood and sleep disturbance. The patient is not nervous/anxious.     Objective:  BP 118/70 (BP Location: Left Arm, Patient Position: Sitting, Cuff Size: Normal)   Pulse 70   Temp 98.2 F (36.8 C) (Oral)  Ht 5\' 4"  (1.626 m)   Wt 186 lb (84.4 kg)   SpO2 92%   BMI 31.93 kg/m   BP Readings from Last 3 Encounters:  04/21/23 118/70  01/16/23 110/70  10/09/22 120/82    Wt Readings from Last 3 Encounters:  04/21/23 186 lb (84.4 kg)  01/16/23 187 lb (84.8 kg)  10/09/22 191 lb (86.6 kg)    Physical Exam Constitutional:      General: He is not in acute distress.    Appearance: He is well-developed.     Comments: NAD  Eyes:     Conjunctiva/sclera: Conjunctivae normal.     Pupils: Pupils are equal, round, and reactive to light.  Neck:     Thyroid: No thyromegaly.     Vascular: No JVD.  Cardiovascular:     Rate and Rhythm: Normal rate and regular rhythm.      Heart sounds: Normal heart sounds. No murmur heard.    No friction rub. No gallop.  Pulmonary:     Effort: Pulmonary effort is normal. No respiratory distress.     Breath sounds: Normal breath sounds. No wheezing or rales.  Chest:     Chest wall: No tenderness.  Abdominal:     General: Bowel sounds are normal. There is no distension.     Palpations: Abdomen is soft. There is no mass.     Tenderness: There is no abdominal tenderness. There is no guarding or rebound.  Musculoskeletal:        General: No tenderness. Normal range of motion.     Cervical back: Normal range of motion.  Lymphadenopathy:     Cervical: No cervical adenopathy.  Skin:    General: Skin is warm and dry.     Findings: No rash.  Neurological:     Mental Status: He is alert and oriented to person, place, and time.     Cranial Nerves: No cranial nerve deficit.     Motor: No abnormal muscle tone.     Coordination: Coordination normal.     Gait: Gait normal.     Deep Tendon Reflexes: Reflexes are normal and symmetric.  Psychiatric:        Behavior: Behavior normal.        Thought Content: Thought content normal.        Judgment: Judgment normal.     Lab Results  Component Value Date   WBC 4.8 06/26/2022   HGB 15.4 06/26/2022   HCT 45.8 06/26/2022   PLT 197.0 06/26/2022   GLUCOSE 118 (H) 01/16/2023   CHOL 207 (H) 11/29/2021   TRIG 81.0 11/29/2021   HDL 85.00 11/29/2021   LDLCALC 106 (H) 11/29/2021   ALT 15 01/16/2023   AST 18 01/16/2023   NA 138 01/16/2023   K 4.5 01/16/2023   CL 102 01/16/2023   CREATININE 0.94 01/16/2023   BUN 14 01/16/2023   CO2 31 01/16/2023   TSH 1.97 11/29/2021   PSA 1.07 06/26/2022   HGBA1C 6.1 01/16/2023   MICROALBUR 1.9 06/21/2020    CT Super D Chest Wo Contrast  Result Date: 12/11/2021 CLINICAL DATA:  Pulmonary nodule. EXAM: CT CHEST WITHOUT CONTRAST TECHNIQUE: Multidetector CT imaging of the chest was performed using thin slice collimation for electromagnetic  bronchoscopy planning purposes, without intravenous contrast. RADIATION DOSE REDUCTION: This exam was performed according to the departmental dose-optimization program which includes automated exposure control, adjustment of the mA and/or kV according to patient size and/or use of iterative reconstruction technique. COMPARISON:  08/29/2021.  FINDINGS: Cardiovascular: The heart size is normal. No substantial pericardial effusion. Coronary artery calcification is evident. Mild atherosclerotic calcification is noted in the wall of the thoracic aorta. Mediastinum/Nodes: No mediastinal lymphadenopathy. No evidence for gross hilar lymphadenopathy although assessment is limited by the lack of intravenous contrast on the current study. The esophagus has normal imaging features. There is no axillary lymphadenopathy. Lungs/Pleura: Centrilobular and paraseptal emphysema evident. 5 mm left lower lobe nodule on 95/7 is stable in the interval. The 10 mm new nodule of concern in the subpleural right lower lobe previously has resolved in the interval. No new suspicious pulmonary nodule or mass. Subpleural reticulation again noted bilaterally. No focal airspace consolidation. No pleural effusion. Upper Abdomen: Unremarkable Musculoskeletal: No worrisome lytic or sclerotic osseous abnormality. IMPRESSION: 1. The 10 mm new nodule of concern in the subpleural right lower lobe previously has resolved in the interval. 2. 5 mm left lower lobe pulmonary nodule is stable in the interval. Present since a chest CT of 07/09/2016, this is consistent with benign etiology. 3. Aortic Atherosclerosis (ICD10-I70.0) and Emphysema (ICD10-J43.9). Electronically Signed   By: Kennith Center M.D.   On: 12/11/2021 13:29    Assessment & Plan:   Problem List Items Addressed This Visit     Dyslipidemia    Off Pitavastatin, Vascepa Mega red was d/c'd by Ophthalmology      Diabetes type 2, controlled (HCC) - Primary     On Ozempic 1 mg/wk, Metfomin  XR, Actos      Relevant Orders   Comprehensive metabolic panel   Hemoglobin A1c   Microalbumin / creatinine urine ratio   Insomnia    Start Zolpidem  Potential benefits of a long term benzodiazepines  use as well as potential risks  and complications were explained to the patient and were aknowledged.       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 Steele Sizer, MD -right primary acquired melanosis. Dr Pearletha Furl - [facility] S/p bx 2024         No orders of the defined types were placed in this encounter.     Follow-up: No follow-ups on file.  Sonda Primes, MD

## 2023-04-29 DIAGNOSIS — D3132 Benign neoplasm of left choroid: Secondary | ICD-10-CM | POA: Diagnosis not present

## 2023-04-29 LAB — HM DIABETES EYE EXAM

## 2023-06-09 ENCOUNTER — Encounter: Payer: Self-pay | Admitting: Internal Medicine

## 2023-06-09 ENCOUNTER — Other Ambulatory Visit: Payer: Self-pay | Admitting: Internal Medicine

## 2023-06-11 ENCOUNTER — Telehealth: Payer: Self-pay

## 2023-06-11 ENCOUNTER — Other Ambulatory Visit (HOSPITAL_COMMUNITY): Payer: Self-pay

## 2023-06-11 NOTE — Telephone Encounter (Signed)
 Pharmacy Patient Advocate Encounter   Received notification from Patient Advice Request messages that prior authorization for Ozempic  (1 MG/DOSE) 4MG /3ML pen-injectors is required/requested.   Insurance verification completed.   The patient is insured through [facility] .   Per test claim: PA required; PA submitted to above mentioned insurance via CoverMyMeds Key/confirmation #/EOC [facility] Status is pending

## 2023-06-12 ENCOUNTER — Other Ambulatory Visit (HOSPITAL_COMMUNITY): Payer: Self-pay

## 2023-06-12 NOTE — Telephone Encounter (Signed)
Pharmacy Patient Advocate Encounter  Received notification from [facility] that Prior Authorization for Ozempic (1 MG/DOSE) 4MG /3ML pen-injectors  has been APPROVED from 06/11/23 to 05/26/24. Ran test claim, Copay is $302. This test claim was processed through [facility]- copay amounts may vary at other pharmacies due to pharmacy/plan contracts, or as the patient moves through the different stages of their insurance plan.   PA #/Case ID/Reference #: [id number]

## 2023-06-12 NOTE — Telephone Encounter (Signed)
Patient notified via My Chart

## 2023-06-27 ENCOUNTER — Other Ambulatory Visit: Payer: Self-pay | Admitting: Internal Medicine

## 2023-07-01 DIAGNOSIS — H11131 Conjunctival pigmentations, right eye: Secondary | ICD-10-CM | POA: Diagnosis not present

## 2023-07-03 DIAGNOSIS — H0235 Blepharochalasis left lower eyelid: Secondary | ICD-10-CM | POA: Diagnosis not present

## 2023-07-03 DIAGNOSIS — H02423 Myogenic ptosis of bilateral eyelids: Secondary | ICD-10-CM | POA: Diagnosis not present

## 2023-07-03 DIAGNOSIS — H11823 Conjunctivochalasis, bilateral: Secondary | ICD-10-CM | POA: Diagnosis not present

## 2023-07-03 DIAGNOSIS — H0234 Blepharochalasis left upper eyelid: Secondary | ICD-10-CM | POA: Diagnosis not present

## 2023-07-03 DIAGNOSIS — H0279 Other degenerative disorders of eyelid and periocular area: Secondary | ICD-10-CM | POA: Diagnosis not present

## 2023-07-03 DIAGNOSIS — H0232 Blepharochalasis right lower eyelid: Secondary | ICD-10-CM | POA: Diagnosis not present

## 2023-07-03 DIAGNOSIS — H0289 Other specified disorders of eyelid: Secondary | ICD-10-CM | POA: Diagnosis not present

## 2023-07-03 DIAGNOSIS — H0231 Blepharochalasis right upper eyelid: Secondary | ICD-10-CM | POA: Diagnosis not present

## 2023-07-03 DIAGNOSIS — H53489 Generalized contraction of visual field, unspecified eye: Secondary | ICD-10-CM | POA: Diagnosis not present

## 2023-07-03 DIAGNOSIS — H02421 Myogenic ptosis of right eyelid: Secondary | ICD-10-CM | POA: Diagnosis not present

## 2023-07-03 DIAGNOSIS — H0261 Xanthelasma of right upper eyelid: Secondary | ICD-10-CM | POA: Diagnosis not present

## 2023-07-03 DIAGNOSIS — H02422 Myogenic ptosis of left eyelid: Secondary | ICD-10-CM | POA: Diagnosis not present

## 2023-07-22 ENCOUNTER — Ambulatory Visit: Payer: PPO | Admitting: Internal Medicine

## 2023-08-05 ENCOUNTER — Encounter: Payer: Self-pay | Admitting: Internal Medicine

## 2023-08-05 ENCOUNTER — Ambulatory Visit: Payer: PPO | Admitting: Internal Medicine

## 2023-08-05 VITALS — BP 120/60 | HR 63 | Temp 97.7°F | Ht 64.0 in | Wt 181.0 lb

## 2023-08-05 DIAGNOSIS — I10 Essential (primary) hypertension: Secondary | ICD-10-CM | POA: Diagnosis not present

## 2023-08-05 DIAGNOSIS — Z7984 Long term (current) use of oral hypoglycemic drugs: Secondary | ICD-10-CM

## 2023-08-05 DIAGNOSIS — J45909 Unspecified asthma, uncomplicated: Secondary | ICD-10-CM

## 2023-08-05 DIAGNOSIS — E119 Type 2 diabetes mellitus without complications: Secondary | ICD-10-CM | POA: Diagnosis not present

## 2023-08-05 DIAGNOSIS — Z7985 Long-term (current) use of injectable non-insulin antidiabetic drugs: Secondary | ICD-10-CM | POA: Diagnosis not present

## 2023-08-05 LAB — COMPREHENSIVE METABOLIC PANEL WITH GFR
ALT: 14 U/L (ref 0–53)
AST: 15 U/L (ref 0–37)
Albumin: 3.9 g/dL (ref 3.5–5.2)
Alkaline Phosphatase: 83 U/L (ref 39–117)
BUN: 13 mg/dL (ref 6–23)
CO2: 29 meq/L (ref 19–32)
Calcium: 9.2 mg/dL (ref 8.4–10.5)
Chloride: 103 meq/L (ref 96–112)
Creatinine, Ser: 0.84 mg/dL (ref 0.40–1.50)
GFR: 83.11 mL/min
Glucose, Bld: 113 mg/dL — ABNORMAL HIGH (ref 70–99)
Potassium: 4.1 meq/L (ref 3.5–5.1)
Sodium: 137 meq/L (ref 135–145)
Total Bilirubin: 0.5 mg/dL (ref 0.2–1.2)
Total Protein: 6.5 g/dL (ref 6.0–8.3)

## 2023-08-05 LAB — HEMOGLOBIN A1C: Hgb A1c MFr Bld: 5.9 % (ref 4.6–6.5)

## 2023-08-05 MED ORDER — SEMAGLUTIDE (1 MG/DOSE) 4 MG/3ML ~~LOC~~ SOPN: 1.0000 mg | PEN_INJECTOR | SUBCUTANEOUS | 5 refills | Status: DC

## 2023-08-05 NOTE — Patient Instructions (Signed)
 Take Actos 1/2 tablet a day

## 2023-08-05 NOTE — Progress Notes (Signed)
 Subjective:  Patient ID: Chase Nails., male    DOB: [birth year]  Age: 78 y.o. MRN: [id number]  CC: Medical Management of Chronic Issues   HPI [name]. presents for DM, HTN, anxiety  Outpatient Medications Prior to Visit  Medication Sig Dispense Refill   ALPRAZolam (XANAX) 0.5 MG tablet Take 0.5 mg by mouth at bedtime as needed.     amLODipine (NORVASC) 10 MG tablet TAKE 1 TABLET BY MOUTH EVERY DAY BEFORE BREAKFAST 90 tablet 3   aspirin EC 81 MG tablet Take 81 mg by mouth daily.     Blood Glucose Monitoring Suppl (ONETOUCH VERIO) w/Device KIT 1 Units by Does not apply route daily as needed. 1 kit 0   budesonide-formoterol (SYMBICORT) 80-4.5 MCG/ACT inhaler Inhale 2 puffs into the lungs in the morning and at bedtime.     carvedilol (COREG) 25 MG tablet TAKE 1 TABLET (25 MG TOTAL) BY MOUTH TWICE A DAY WITH MEALS 180 tablet 3   Cholecalciferol (VITAMIN D3) 1000 UNITS tablet Take 2,000 Units by mouth daily.     Coenzyme Q10 (COQ10 MAXIMUM STRENGTH) 400 MG CAPS Take by mouth. Take 1 capsule a day     Folic Acid 0.8 MG CAPS Take 2 capsules by mouth every morning.  30 each 0   glucose blood test strip USE TO TEST BLOOD SUGAR TWICE A DAY 100 each 3   metFORMIN (GLUCOPHAGE-XR) 750 MG 24 hr tablet TAKE 1 TABLET BY MOUTH DAILY WITH BREAKFAST. ANNUAL APPT DUE IN JULY MUST SEE PROVIDER FOR REFILLS 90 tablet 0   Multiple Vitamins-Minerals (PRESERVISION AREDS PO)      neomycin-polymyxin b-dexamethasone (MAXITROL) 3.5-10000-0.1 OINT SMARTSIG:sparingly In Eye(s)     OneTouch Delica Lancets 33G MISC USE TO CHECK BLOOD SUGAR TWICE DAILY 100 each 3   ONETOUCH VERIO test strip USE TO CHECK BLOOD SUGAR DAILY 100 strip 5   pioglitazone (ACTOS) 45 MG tablet Take 1 tablet (45 mg total) by mouth daily. Follow-appt due in July must see provider for future refills 90 tablet 3   temazepam (RESTORIL) 15 MG capsule Take 1-2 capsules (15-30 mg total) by mouth at bedtime as needed for sleep. 60  capsule 3   traMADol (ULTRAM) 50 MG tablet TAKE 0.5-1 TABLETS (25-50 MG TOTAL) BY MOUTH EVERY 6 (SIX) HOURS AS NEEDED (COUGH). 100 tablet 1   zolpidem (AMBIEN) 10 MG tablet TAKE 0.5-1 TABLET (5-10 MG TOTAL) BY MOUTH AT BEDTIME AS NEEDED FOR SLEEP. 30 tablet 3   Semaglutide, 1 MG/DOSE, 4 MG/3ML SOPN Inject 1 mg as directed once a week. 9 mL 3   No facility-administered medications prior to visit.    ROS: Review of Systems  Constitutional:  Negative for appetite change, fatigue and unexpected weight change.  HENT:  Negative for congestion, nosebleeds, sneezing, sore throat and trouble swallowing.   Eyes:  Negative for itching and visual disturbance.  Respiratory:  Negative for cough.   Cardiovascular:  Negative for chest pain, palpitations and leg swelling.  Gastrointestinal:  Negative for abdominal distention, blood in stool, diarrhea and nausea.  Genitourinary:  Negative for frequency and hematuria.  Musculoskeletal:  Negative for back pain, gait problem, joint swelling and neck pain.  Skin:  Negative for rash.  Neurological:  Negative for dizziness, tremors, speech difficulty and weakness.  Psychiatric/Behavioral:  Negative for agitation, dysphoric mood, sleep disturbance and suicidal ideas. The patient is not nervous/anxious.     Objective:  BP 120/60   Pulse 63   Temp  97.7 F (36.5 C) (Oral)   Ht 5\' 4"  (1.626 m)   Wt 181 lb (82.1 kg)   SpO2 90%   BMI 31.07 kg/m   BP Readings from Last 3 Encounters:  08/05/23 120/60  04/21/23 118/70  01/16/23 110/70    Wt Readings from Last 3 Encounters:  08/05/23 181 lb (82.1 kg)  04/21/23 186 lb (84.4 kg)  01/16/23 187 lb (84.8 kg)    Physical Exam Constitutional:      General: He is not in acute distress.    Appearance: He is well-developed.     Comments: NAD  Eyes:     Conjunctiva/sclera: Conjunctivae normal.     Pupils: Pupils are equal, round, and reactive to light.  Neck:     Thyroid: No thyromegaly.     Vascular: No  JVD.  Cardiovascular:     Rate and Rhythm: Normal rate and regular rhythm.     Heart sounds: Normal heart sounds. No murmur heard.    No friction rub. No gallop.  Pulmonary:     Effort: Pulmonary effort is normal. No respiratory distress.     Breath sounds: Normal breath sounds. No wheezing or rales.  Chest:     Chest wall: No tenderness.  Abdominal:     General: Bowel sounds are normal. There is no distension.     Palpations: Abdomen is soft. There is no mass.     Tenderness: There is no abdominal tenderness. There is no guarding or rebound.  Musculoskeletal:        General: No tenderness. Normal range of motion.     Cervical back: Normal range of motion.     Right lower leg: No edema.     Left lower leg: No edema.  Lymphadenopathy:     Cervical: No cervical adenopathy.  Skin:    General: Skin is warm and dry.     Findings: No rash.  Neurological:     Mental Status: He is alert and oriented to person, place, and time.     Cranial Nerves: No cranial nerve deficit.     Motor: No abnormal muscle tone.     Coordination: Coordination normal.     Gait: Gait normal.     Deep Tendon Reflexes: Reflexes are normal and symmetric.  Psychiatric:        Behavior: Behavior normal.        Thought Content: Thought content normal.        Judgment: Judgment normal.   Heavy eyelids  Lab Results  Component Value Date   WBC 4.8 06/26/2022   HGB 15.4 06/26/2022   HCT 45.8 06/26/2022   PLT 197.0 06/26/2022   GLUCOSE 105 (H) 04/21/2023   CHOL 207 (H) 11/29/2021   TRIG 81.0 11/29/2021   HDL 85.00 11/29/2021   LDLCALC 106 (H) 11/29/2021   ALT 12 04/21/2023   AST 14 04/21/2023   NA 140 04/21/2023   K 4.4 04/21/2023   CL 104 04/21/2023   CREATININE 0.88 04/21/2023   BUN 14 04/21/2023   CO2 29 04/21/2023   TSH 1.97 11/29/2021   PSA 1.07 06/26/2022   HGBA1C 6.0 04/21/2023   MICROALBUR 1.2 04/21/2023    CT Super D Chest Wo Contrast Result Date: 12/11/2021 CLINICAL DATA:  Pulmonary  nodule. EXAM: CT CHEST WITHOUT CONTRAST TECHNIQUE: Multidetector CT imaging of the chest was performed using thin slice collimation for electromagnetic bronchoscopy planning purposes, without intravenous contrast. RADIATION DOSE REDUCTION: This exam was performed according to the departmental dose-optimization  program which includes automated exposure control, adjustment of the mA and/or kV according to patient size and/or use of iterative reconstruction technique. COMPARISON:  08/29/2021. FINDINGS: Cardiovascular: The heart size is normal. No substantial pericardial effusion. Coronary artery calcification is evident. Mild atherosclerotic calcification is noted in the wall of the thoracic aorta. Mediastinum/Nodes: No mediastinal lymphadenopathy. No evidence for gross hilar lymphadenopathy although assessment is limited by the lack of intravenous contrast on the current study. The esophagus has normal imaging features. There is no axillary lymphadenopathy. Lungs/Pleura: Centrilobular and paraseptal emphysema evident. 5 mm left lower lobe nodule on 95/7 is stable in the interval. The 10 mm new nodule of concern in the subpleural right lower lobe previously has resolved in the interval. No new suspicious pulmonary nodule or mass. Subpleural reticulation again noted bilaterally. No focal airspace consolidation. No pleural effusion. Upper Abdomen: Unremarkable Musculoskeletal: No worrisome lytic or sclerotic osseous abnormality. IMPRESSION: 1. The 10 mm new nodule of concern in the subpleural right lower lobe previously has resolved in the interval. 2. 5 mm left lower lobe pulmonary nodule is stable in the interval. Present since a chest CT of 07/09/2016, this is consistent with benign etiology. 3. Aortic Atherosclerosis (ICD10-I70.0) and Emphysema (ICD10-J43.9). Electronically Signed   By: Kennith Center M.D.   On: 12/11/2021 13:29    Assessment & Plan:   Problem List Items Addressed This Visit     Diabetes type  2, controlled (HCC) - Primary   Lost 20 lbs total  On Ozempic, Metfomin XR,  Actos - take 1/2 tab      Relevant Medications   Semaglutide, 1 MG/DOSE, 4 MG/3ML SOPN   Essential hypertension   Cont on Coreg, Amlodipine Lost 20 lbs total       Intrinsic asthma   Better on Symbicort         Meds ordered this encounter  Medications   Semaglutide, 1 MG/DOSE, 4 MG/3ML SOPN    Sig: Inject 1 mg as directed once a week.    Dispense:  9 mL    Refill:  5      Follow-up: Return in about 3 months (around 11/05/2023) for a follow-up visit.  Sonda Primes, MD

## 2023-08-05 NOTE — Assessment & Plan Note (Signed)
Better on Symbicort

## 2023-08-05 NOTE — Assessment & Plan Note (Addendum)
 Lost 20 lbs total  On Ozempic, Metfomin XR,  Actos - take 1/2 tab

## 2023-08-05 NOTE — Assessment & Plan Note (Signed)
 Cont on Coreg, Amlodipine Lost 20 lbs total

## 2023-08-06 ENCOUNTER — Encounter: Payer: Self-pay | Admitting: Internal Medicine

## 2023-09-10 DIAGNOSIS — H02423 Myogenic ptosis of bilateral eyelids: Secondary | ICD-10-CM | POA: Diagnosis not present

## 2023-09-10 DIAGNOSIS — H0261 Xanthelasma of right upper eyelid: Secondary | ICD-10-CM | POA: Diagnosis not present

## 2023-09-10 DIAGNOSIS — H53483 Generalized contraction of visual field, bilateral: Secondary | ICD-10-CM | POA: Diagnosis not present

## 2023-09-11 ENCOUNTER — Other Ambulatory Visit: Payer: Self-pay | Admitting: Internal Medicine

## 2023-09-25 ENCOUNTER — Encounter: Payer: Self-pay | Admitting: Internal Medicine

## 2023-10-08 ENCOUNTER — Ambulatory Visit (INDEPENDENT_AMBULATORY_CARE_PROVIDER_SITE_OTHER): Payer: PPO

## 2023-10-08 VITALS — Ht 64.0 in | Wt 179.0 lb

## 2023-10-08 DIAGNOSIS — Z0000 Encounter for general adult medical examination without abnormal findings: Secondary | ICD-10-CM

## 2023-10-08 NOTE — Patient Instructions (Signed)
 [name] , Thank you for taking time out of your busy schedule to complete your Annual Wellness Visit with me. I enjoyed our conversation and look forward to speaking with you again next year. I, as well as your care team,  appreciate your ongoing commitment to your health goals. Please review the following plan we discussed and let me know if I can assist you in the future. Your Game plan/ To Do List   Follow up Visits: Next Medicare AWV with our clinical staff: Patient declined to schedule a 2026 AWV appt at this time   Have you seen your provider in the last 6 months (3 months if uncontrolled diabetes)? Yes Next Office Visit with your provider: 11/05/2023  Clinician Recommendations:  Aim for 30 minutes of exercise or brisk walking, 6-8 glasses of water, and 5 servings of fruits and vegetables each day.       This is a list of the screening recommended for you and due dates:  Health Maintenance  Topic Date Due   Complete foot exam   06/15/2020   COVID-19 Vaccine (4 - 2024-25 season) 10/24/2023*   Flu Shot  12/26/2023   Hemoglobin A1C  02/05/2024   Yearly kidney health urinalysis for diabetes  04/20/2024   Eye exam for diabetics  04/28/2024   Yearly kidney function blood test for diabetes  08/04/2024   Medicare Annual Wellness Visit  10/07/2024   DTaP/Tdap/Td vaccine (3 - Td or Tdap) 10/13/2029   Pneumonia Vaccine  Completed   Hepatitis C Screening  Completed   Zoster (Shingles) Vaccine  Completed   HPV Vaccine  Aged Out   Meningitis B Vaccine  Aged Out   Colon Cancer Screening  Discontinued  *Topic was postponed. The date shown is not the original due date.    Advanced directives: (Copy Requested) Please bring a copy of your health care power of attorney and living will to the office to be added to your chart at your convenience. You can mail to [facility] [address] [place], [place] [address] or email to [email or web address] Advance Care Planning is  important because it:  [x]  Makes sure you receive the medical care that is consistent with your values, goals, and preferences  [x]  It provides guidance to your family and loved ones and reduces their decisional burden about whether or not they are making the right decisions based on your wishes.  Follow the link provided in your after visit summary or read over the paperwork we have mailed to you to help you started getting your Advance Directives in place. If you need assistance in completing these, please reach out to us  so that we can help you!   Managing Pain Without Opioids Opioids are strong medicines used to treat moderate to severe pain. For some people, especially those who have long-term (chronic) pain, opioids may not be the best choice for pain management due to: Side effects like nausea, constipation, and sleepiness. The risk of addiction (opioid use disorder). The longer you take opioids, the greater your risk of addiction. Pain that lasts for more than 3 months is called chronic pain. Managing chronic pain usually requires more than one approach and is often provided by a team of health care providers working together (multidisciplinary approach). Pain management may be done at a pain management center or pain clinic. How to manage pain without the use of opioids Use non-opioid medicines Non-opioid medicines for pain may include: Over-the-counter or prescription non-steroidal anti-inflammatory  drugs (NSAIDs). These may be the first medicines used for pain. They work well for muscle and bone pain, and they reduce swelling. Acetaminophen. This over-the-counter medicine may work well for milder pain but not swelling. Antidepressants. These may be used to treat chronic pain. A certain type of antidepressant (tricyclics) is often used. These medicines are given in lower doses for pain than when used for depression. Anticonvulsants. These are usually used to treat seizures but may also  reduce nerve (neuropathic) pain. Muscle relaxants. These relieve pain caused by sudden muscle tightening (spasms). You may also use a pain medicine that is applied to the skin as a patch, cream, or gel (topical analgesic), such as a numbing medicine. These may cause fewer side effects than medicines taken by mouth. Do certain therapies as directed Some therapies can help with pain management. They include: Physical therapy. You will do exercises to gain strength and flexibility. A physical therapist may teach you exercises to move and stretch parts of your body that are weak, stiff, or painful. You can learn these exercises at physical therapy visits and practice them at home. Physical therapy may also involve: Massage. Heat wraps or applying heat or cold to affected areas. Electrical signals that interrupt pain signals (transcutaneous electrical nerve stimulation, TENS). Weak lasers that reduce pain and swelling (low-level laser therapy). Signals from your body that help you learn to regulate pain (biofeedback). Occupational therapy. This helps you to learn ways to function at home and work with less pain. Recreational therapy. This involves trying new activities or hobbies, such as a physical activity or drawing. Mental health therapy, including: Cognitive behavioral therapy (CBT). This helps you learn coping skills for dealing with pain. Acceptance and commitment therapy (ACT) to change the way you think and react to pain. Relaxation therapies, including muscle relaxation exercises and mindfulness-based stress reduction. Pain management counseling. This may be individual, family, or group counseling.  Receive medical treatments Medical treatments for pain management include: Nerve block injections. These may include a pain blocker and anti-inflammatory medicines. You may have injections: Near the spine to relieve chronic back or neck pain. Into joints to relieve back or joint pain. Into  nerve areas that supply a painful area to relieve body pain. Into muscles (trigger point injections) to relieve some painful muscle conditions. A medical device placed near your spine to help block pain signals and relieve nerve pain or chronic back pain (spinal cord stimulation device). Acupuncture. Follow these instructions at home Medicines Take over-the-counter and prescription medicines only as told by your health care provider. If you are taking pain medicine, ask your health care providers about possible side effects to watch out for. Do not drive or use heavy machinery while taking prescription opioid pain medicine. Lifestyle  Do not use drugs or alcohol to reduce pain. If you drink alcohol, limit how much you have to: 0-1 drink a day for women who are not pregnant. 0-2 drinks a day for men. Know how much alcohol is in a drink. In the [place] one drink equals one 12 oz bottle of beer (355 mL), one 5 oz glass of wine (148 mL), or one 1 oz glass of hard liquor (44 mL). Do not use any products that contain nicotine or tobacco. These products include cigarettes, chewing tobacco, and vaping devices, such as e-cigarettes. If you need help quitting, ask your health care provider. Eat a healthy diet and maintain a healthy weight. Poor diet and excess weight may make pain worse. Eat  foods that are high in fiber. These include fresh fruits and vegetables, whole grains, and beans. Limit foods that are high in fat and processed sugars, such as fried and sweet foods. Exercise regularly. Exercise lowers stress and may help relieve pain. Ask your health care provider what activities and exercises are safe for you. If your health care provider approves, join an exercise class that combines movement and stress reduction. Examples include yoga and tai chi. Get enough sleep. Lack of sleep may make pain worse. Lower stress as much as possible. Practice stress reduction techniques as told by your  therapist. General instructions Work with all your pain management providers to find the treatments that work best for you. You are an important member of your pain management team. There are many things you can do to reduce pain on your own. Consider joining an online or in-person support group for people who have chronic pain. Keep all follow-up visits. This is important. Where to find more information You can find more information about managing pain without opioids from: American Academy of Pain Medicine: painmed.org Institute for Chronic Pain: instituteforchronicpain.org American Chronic Pain Association: theacpa.org Contact a health care provider if: You have side effects from pain medicine. Your pain gets worse or does not get better with treatments or home therapy. You are struggling with anxiety or depression. Summary Many types of pain can be managed without opioids. Chronic pain may respond better to pain management without opioids. Pain is best managed when you and a team of health care providers work together. Pain management without opioids may include non-opioid medicines, medical treatments, physical therapy, mental health therapy, and lifestyle changes. Tell your health care providers if your pain gets worse or is not being managed well enough. This information is not intended to replace advice given to you by your health care provider. Make sure you discuss any questions you have with your health care provider. Document Revised: 08/23/2020 Document Reviewed: 08/23/2020 Elsevier Patient Education  2024 [employer].

## 2023-10-08 NOTE — Progress Notes (Addendum)
 Subjective:  Please attest and cosign this visit due to patients primary care provider not being in the office at the time the visit was completed.  (Pt of Dr. Alana Harrell. [name])   [name]. is a 78 y.o. who presents for a Medicare Wellness preventive visit.  As a reminder, Annual Wellness Visits don't include a physical exam, and some assessments may be limited, especially if this visit is performed virtually. We may recommend an in-person visit if needed.  Visit Complete: Virtual I connected with  [name]. on 10/08/23 by a audio enabled telemedicine application and verified that I am speaking with the correct person using two identifiers.  Patient Location: Home  Provider Location: Office/Clinic  I discussed the limitations of evaluation and management by telemedicine. The patient expressed understanding and agreed to proceed.  Vital Signs: Because this visit was a virtual/telehealth visit, some criteria may be missing or patient reported. Any vitals not documented were not able to be obtained and vitals that have been documented are patient reported.  VideoDeclined- This patient declined Librarian, academic. Therefore the visit was completed with audio only.  Persons Participating in Visit: Patient.  AWV Questionnaire: No: Patient Medicare AWV questionnaire was not completed prior to this visit.  Cardiac Risk Factors include: advanced age (>80men, >80 women);diabetes mellitus;hypertension;dyslipidemia;obesity (BMI >30kg/m2);male gender     Objective:     Today's Vitals   10/08/23 1131  Weight: 179 lb (81.2 kg)  Height: 5\' 4"  (1.626 m)   Body mass index is 30.73 kg/m.     10/08/2023   11:30 AM 10/07/2022    1:45 PM 12/05/2021   11:34 AM 11/23/2020   11:21 AM 06/12/2020   12:33 PM 10/14/2019    8:26 AM 08/21/2016   10:41 AM  Advanced Directives  Does Patient Have a Medical Advance Directive? Yes Yes Yes Yes Yes Yes No  Type of  Estate agent of [place];Living will Healthcare Power of [place];Living will Living will;Healthcare Power of Attorney Living will;Healthcare Power of Attorney Living will;Healthcare Power of [employer] Power of [place];Living will   Does patient want to make changes to medical advance directive?   No - Patient declined No - Patient declined No - Patient declined No - Patient declined   Copy of Healthcare Power of Attorney in Chart? No - copy requested No - copy requested No - copy requested No - copy requested  No - copy requested   Would patient like information on creating a medical advance directive?       Yes (ED - Information included in AVS)    Current Medications (verified) Outpatient Encounter Medications as of 10/08/2023  Medication Sig   ALPRAZolam  (XANAX ) 0.5 MG tablet Take 0.5 mg by mouth at bedtime as needed.   amLODipine  (NORVASC ) 10 MG tablet TAKE 1 TABLET BY MOUTH EVERY DAY BEFORE BREAKFAST   aspirin EC 81 MG tablet Take 81 mg by mouth daily.   Blood Glucose Monitoring Suppl (ONETOUCH VERIO) w/Device KIT 1 Units by Does not apply route daily as needed.   carvedilol  (COREG ) 25 MG tablet TAKE 1 TABLET (25 MG TOTAL) BY MOUTH TWICE A DAY WITH MEALS   Cholecalciferol (VITAMIN D3) 1000 UNITS tablet Take 2,000 Units by mouth daily.   Coenzyme Q10 (COQ10 MAXIMUM STRENGTH) 400 MG CAPS Take by mouth. Take 1 capsule a day   Folic Acid  0.8 MG CAPS Take 2 capsules by mouth every morning.    glucose  blood test strip USE TO TEST BLOOD SUGAR TWICE A DAY   metFORMIN  (GLUCOPHAGE -XR) 750 MG 24 hr tablet TAKE 1 TABLET BY MOUTH DAILY WITH BREAKFAST. ANNUAL APPT DUE IN JULY MUST SEE PROVIDER FOR REFILLS   Multiple Vitamins-Minerals (PRESERVISION AREDS PO)    neomycin-polymyxin b-dexamethasone  (MAXITROL) 3.5-10000-0.1 OINT SMARTSIG:sparingly In Eye(s)   OneTouch Delica Lancets 33G MISC USE TO CHECK BLOOD SUGAR TWICE DAILY   ONETOUCH VERIO test strip USE TO CHECK BLOOD  SUGAR DAILY   pioglitazone  (ACTOS ) 45 MG tablet Take 1 tablet (45 mg total) by mouth daily. Follow-appt due in July must see provider for future refills   Semaglutide , 1 MG/DOSE, 4 MG/3ML SOPN Inject 1 mg as directed once a week.   traMADol  (ULTRAM ) 50 MG tablet TAKE 0.5-1 TABLETS (25-50 MG TOTAL) BY MOUTH EVERY 6 (SIX) HOURS AS NEEDED (COUGH).   zolpidem  (AMBIEN ) 10 MG tablet TAKE 0.5-1 TABLET (5-10 MG TOTAL) BY MOUTH AT BEDTIME AS NEEDED FOR SLEEP.   budesonide -formoterol  (SYMBICORT ) 80-4.5 MCG/ACT inhaler Inhale 2 puffs into the lungs in the morning and at bedtime. (Patient not taking: Reported on 10/08/2023)   temazepam  (RESTORIL ) 15 MG capsule Take 1-2 capsules (15-30 mg total) by mouth at bedtime as needed for sleep. (Patient not taking: Reported on 10/08/2023)   No facility-administered encounter medications on file as of 10/08/2023.    Allergies (verified) Fish oil-krill oil, Olmesartan , Other, Pitavastatin , Pravastatin , and Symbicort  [budesonide -formoterol  fumarate]   History: Past Medical History:  Diagnosis Date   Asthma    mild, chronic cough   Chronic kidney disease    kidney stone and infection- started 03-27-11   COPD (chronic obstructive pulmonary disease) (HCC)    Diabetes mellitus    GERD (gastroesophageal reflux disease)    Hyperlipidemia    Hypertension    Obesity    Past Surgical History:  Procedure Laterality Date   APPENDECTOMY  1963   CYSTOSCOPY W/ URETERAL STENT PLACEMENT  03/26/11   removal of kidney stone   MASS EXCISION Left 06/16/2020   Procedure: MINOR EXCISION OF MASS;  Surgeon: Janita Mellow, MD;  Location: [facility];  Service: ENT;  Laterality: Left;   Family History  Problem Relation Age of Onset   Multiple sclerosis Mother    Heart disease Father 23       MI   Diabetes Father    Hypertension Father    Heart disease Sister 78       CAD   Diabetes Sister    Diabetes Brother    Parkinsonism Brother    Hypertension Brother     Hypertension Other    Coronary artery disease Other    Colon cancer Neg Hx    Esophageal cancer Neg Hx    Stomach cancer Neg Hx    Cancer - Colon Neg Hx    Social History   Socioeconomic History   Marital status: Married    Spouse name: Not on file   Number of children: 3   Years of education: Not on file   Highest education level: Master's degree (e.g., MA, MS, MEng, MEd, MSW, MBA)  Occupational History   Occupation: Professional  Tobacco Use   Smoking status: Former    Current packs/day: 0.00    Average packs/day: 1.5 packs/day for 40.0 years (60.0 ttl pk-yrs)    Types: Cigarettes    Start date: 08/13/1961    Quit date: 08/13/2001    Years since quitting: 22.1    Passive exposure: Past  Smokeless tobacco: Never   Tobacco comments:    Counseled to remain smoke free.  Vaping Use   Vaping status: Never Used  Substance and Sexual Activity   Alcohol use: Yes    Alcohol/week: 10.0 standard drinks of alcohol    Types: 10 Glasses of wine per week    Comment: 2 glasses   Drug use: No   Sexual activity: Yes  Other Topics Concern   Not on file  Social History Narrative   Regular Exercise -  YES; riding      Social Drivers of Health   Financial Resource Strain: Low Risk  (10/08/2023)   Overall Financial Resource Strain (CARDIA)    Difficulty of Paying Living Expenses: Not hard at all  Food Insecurity: No Food Insecurity (10/08/2023)   Hunger Vital Sign    Worried About Running Out of Food in the Last Year: Never true    Ran Out of Food in the Last Year: Never true  Transportation Needs: No Transportation Needs (10/08/2023)   PRAPARE - Administrator, Civil Service (Medical): No    Lack of Transportation (Non-Medical): No  Physical Activity: Sufficiently Active (10/08/2023)   Exercise Vital Sign    Days of Exercise per Week: 7 days    Minutes of Exercise per Session: 60 min  Stress: No Stress Concern Present (10/08/2023)   [employer] of Occupational  Health - Occupational Stress Questionnaire    Feeling of Stress : Not at all  Social Connections: Unknown (10/08/2023)   Social Connection and Isolation Panel [NHANES]    Frequency of Communication with Friends and Family: More than three times a week    Frequency of Social Gatherings with Friends and Family: Once a week    Attends Religious Services: Patient declined    Database administrator or Organizations: No    Attends Banker Meetings: Never    Marital Status: Married    Tobacco Counseling Counseling given: No Tobacco comments: Counseled to remain smoke free.    Clinical Intake:  Pre-visit preparation completed: Yes  Pain : No/denies pain     BMI - recorded: 30.73 Nutritional Risks: None Diabetes: Yes CBG done?: No Did pt. bring in CBG monitor from home?: No  Lab Results  Component Value Date   HGBA1C 5.9 08/05/2023   HGBA1C 6.0 04/21/2023   HGBA1C 6.1 01/16/2023     How often do you need to have someone help you when you read instructions, pamphlets, or other written materials from your doctor or pharmacy?: 1 - Never  Interpreter Needed?: No  Information entered by :: Kandy Orris, CMA   Activities of Daily Living     10/08/2023   11:33 AM  In your present state of health, do you have any difficulty performing the following activities:  Hearing? 0  Vision? 0  Difficulty concentrating or making decisions? 0  Walking or climbing stairs? 0  Dressing or bathing? 0  Doing errands, shopping? 0  Preparing Food and eating ? N  Using the Toilet? N  In the past six months, have you accidently leaked urine? N  Do you have problems with loss of bowel control? N  Managing your Medications? N  Managing your Finances? N  Housekeeping or managing your Housekeeping? N    Patient Care Team: [name], Oakley Bellman, MD as PCP - General Croitoru, Mihai, MD as Consulting Physician (Cardiology) Bur[place]i, Heather, OD as Consulting Physician  (Optometry)  Indicate any recent Medical Services you may  have received from other than [facility] providers in the past year (date may be approximate).     Assessment:    This is a routine wellness examination for [place].  Hearing/Vision screen Hearing Screening - Comments:: Wears hearing aids Vision Screening - Comments:: Wears eye glasses - up to date with routine eye exams with Dr Burundi   Goals Addressed               This Visit's Progress     Patient Stated (pt-stated)        Patient stated he plans to stay healthy.       Depression Screen     10/08/2023   11:39 AM 08/05/2023    9:18 AM 04/21/2023    9:00 AM 01/16/2023    8:43 AM 10/07/2022    1:44 PM 06/26/2022    8:29 AM 03/25/2022    8:17 AM  PHQ 2/9 Scores  PHQ - 2 Score 0 0 0 0 0 0 0  PHQ- 9 Score 0    0 3 2    Fall Risk     10/08/2023   11:34 AM 08/05/2023    9:17 AM 04/21/2023    9:00 AM 01/16/2023    8:43 AM 10/07/2022    1:46 PM  Fall Risk   Falls in the past year? 0 0 0 0 0  Number falls in past yr: 0 0 0 0 0  Injury with Fall? 0 0 0 0 0  Risk for fall due to : No Fall Risks No Fall Risks No Fall Risks No Fall Risks No Fall Risks  Follow up Falls prevention discussed;Falls evaluation completed Falls evaluation completed Falls evaluation completed Falls evaluation completed Falls prevention discussed    MEDICARE RISK AT HOME:  Medicare Risk at Home Any stairs in or around the home?: Yes If so, are there any without handrails?: No Home free of loose throw rugs in walkways, pet beds, electrical cords, etc?: Yes Adequate lighting in your home to reduce risk of falls?: Yes Life alert?: No Use of a cane, walker or w/c?: No Grab bars in the bathroom?: No Shower chair or bench in shower?: Yes Elevated toilet seat or a handicapped toilet?: No  TIMED UP AND GO:  Was the test performed?  No  Cognitive Function: 6CIT completed        10/08/2023   11:35 AM 10/07/2022    1:48 PM 12/05/2021   11:50 AM   6CIT Screen  What Year? 0 points 0 points 0 points  What month? 0 points 0 points 0 points  What time? 0 points 0 points 0 points  Count back from 20 0 points 0 points 0 points  Months in reverse 0 points 0 points 0 points  Repeat phrase 0 points 0 points 0 points  Total Score 0 points 0 points 0 points    Immunizations Immunization History  Administered Date(s) Administered   Fluad Quad(high Dose 65+) 02/22/2020, 02/21/2021, 03/25/2022   Influenza Split 02/21/2012   Influenza Whole 02/25/2011   Influenza, High Dose Seasonal PF 03/10/2013, 04/10/2016, 02/19/2017, 03/16/2018, 01/21/2019, 03/04/2023   Influenza,inj,Quad PF,6+ Mos 03/28/2014, 04/04/2015   PFIZER(Purple Top)SARS-COV-2 Vaccination 07/02/2019, 07/27/2019, 04/18/2020   PNEUMOCOCCAL CONJUGATE-20 11/29/2021   Pneumococcal Conjugate-13 07/23/2013   Pneumococcal Polysaccharide-23 11/13/2009   Td 11/13/2009   Tdap 10/14/2019   Zoster Recombinant(Shingrix) 06/07/2020, 08/16/2020   Zoster, Live 11/20/2009    Screening Tests Health Maintenance  Topic Date Due   FOOT EXAM  06/15/2020  COVID-19 Vaccine (4 - 2024-25 season) 10/24/2023 (Originally 01/26/2023)   INFLUENZA VACCINE  12/26/2023   HEMOGLOBIN A1C  02/05/2024   Diabetic kidney evaluation - Urine ACR  04/20/2024   OPHTHALMOLOGY EXAM  04/28/2024   Diabetic kidney evaluation - eGFR measurement  08/04/2024   Medicare Annual Wellness (AWV)  10/07/2024   DTaP/Tdap/Td (3 - Td or Tdap) 10/13/2029   Pneumonia Vaccine 22+ Years old  Completed   Hepatitis C Screening  Completed   Zoster Vaccines- Shingrix  Completed   HPV VACCINES  Aged Out   Meningococcal B Vaccine  Aged Out   Colonoscopy  Discontinued    Health Maintenance  Health Maintenance Due  Topic Date Due   FOOT EXAM  06/15/2020   Health Maintenance Items Addressed: 10/08/2023   Additional Screening:  Vision Screening: Recommended annual ophthalmology exams for early detection of glaucoma and other  disorders of the eye.  Dental Screening: Recommended annual dental exams for proper oral hygiene  Community Resource Referral / Chronic Care Management: CRR required this visit?  No   CCM required this visit?  No   Plan:    I have personally reviewed and noted the following in the patient's chart:   Medical and social history Use of alcohol, tobacco or illicit drugs  Current medications and supplements including opioid prescriptions. Patient is currently taking opioid prescriptions. Information provided to patient regarding non-opioid alternatives. Patient advised to discuss non-opioid treatment plan with their provider. Functional ability and status Nutritional status Physical activity Advanced directives List of other physicians Hospitalizations, surgeries, and ER visits in previous 12 months Vitals Screenings to include cognitive, depression, and falls Referrals and appointments  In addition, I have reviewed and discussed with patient certain preventive protocols, quality metrics, and best practice recommendations. A written personalized care plan for preventive services as well as general preventive health recommendations were provided to patient.   Patria Bookbinder, CMA   10/08/2023   After Visit Summary: (MyChart) Due to this being a telephonic visit, the after visit summary with patients personalized plan was offered to patient via MyChart   Notes: Nothing significant to report at this time.

## 2023-11-05 ENCOUNTER — Ambulatory Visit: Admitting: Internal Medicine

## 2023-11-08 ENCOUNTER — Other Ambulatory Visit: Payer: Self-pay | Admitting: Internal Medicine

## 2023-11-18 ENCOUNTER — Encounter: Payer: Self-pay | Admitting: Internal Medicine

## 2023-11-18 ENCOUNTER — Ambulatory Visit (INDEPENDENT_AMBULATORY_CARE_PROVIDER_SITE_OTHER): Admitting: Internal Medicine

## 2023-11-18 VITALS — BP 118/82 | HR 70 | Temp 97.5°F | Ht 64.0 in | Wt 180.0 lb

## 2023-11-18 DIAGNOSIS — E785 Hyperlipidemia, unspecified: Secondary | ICD-10-CM | POA: Diagnosis not present

## 2023-11-18 DIAGNOSIS — Z0000 Encounter for general adult medical examination without abnormal findings: Secondary | ICD-10-CM | POA: Diagnosis not present

## 2023-11-18 DIAGNOSIS — D751 Secondary polycythemia: Secondary | ICD-10-CM

## 2023-11-18 DIAGNOSIS — E119 Type 2 diabetes mellitus without complications: Secondary | ICD-10-CM

## 2023-11-18 DIAGNOSIS — Z7985 Long-term (current) use of injectable non-insulin antidiabetic drugs: Secondary | ICD-10-CM

## 2023-11-18 DIAGNOSIS — Z7984 Long term (current) use of oral hypoglycemic drugs: Secondary | ICD-10-CM | POA: Diagnosis not present

## 2023-11-18 DIAGNOSIS — H11132 Conjunctival pigmentations, left eye: Secondary | ICD-10-CM | POA: Diagnosis not present

## 2023-11-18 LAB — URINALYSIS, ROUTINE W REFLEX MICROSCOPIC
Bilirubin Urine: NEGATIVE
Ketones, ur: NEGATIVE
Leukocytes,Ua: NEGATIVE
Nitrite: NEGATIVE
Specific Gravity, Urine: 1.025 (ref 1.000–1.030)
Total Protein, Urine: NEGATIVE
Urine Glucose: NEGATIVE
Urobilinogen, UA: 0.2 (ref 0.0–1.0)
pH: 6 (ref 5.0–8.0)

## 2023-11-18 LAB — COMPREHENSIVE METABOLIC PANEL WITH GFR
ALT: 13 U/L (ref 0–53)
AST: 15 U/L (ref 0–37)
Albumin: 4 g/dL (ref 3.5–5.2)
Alkaline Phosphatase: 90 U/L (ref 39–117)
BUN: 15 mg/dL (ref 6–23)
CO2: 32 meq/L (ref 19–32)
Calcium: 9.2 mg/dL (ref 8.4–10.5)
Chloride: 101 meq/L (ref 96–112)
Creatinine, Ser: 0.83 mg/dL (ref 0.40–1.50)
GFR: 83.24 mL/min (ref >60.00)
Glucose, Bld: 100 mg/dL — ABNORMAL HIGH (ref 70–99)
Potassium: 4.6 meq/L (ref 3.5–5.1)
Sodium: 138 meq/L (ref 135–145)
Total Bilirubin: 0.5 mg/dL (ref 0.2–1.2)
Total Protein: 6.2 g/dL (ref 6.0–8.3)

## 2023-11-18 LAB — PSA: PSA: 1.21 ng/mL (ref 0.10–4.00)

## 2023-11-18 LAB — LIPID PANEL
Cholesterol: 213 mg/dL — ABNORMAL HIGH (ref 0–200)
HDL: 69.6 mg/dL (ref >39.00)
LDL Cholesterol: 127 mg/dL — ABNORMAL HIGH (ref 0–99)
NonHDL: 143.45
Total CHOL/HDL Ratio: 3
Triglycerides: 82 mg/dL (ref 0.0–149.0)
VLDL: 16.4 mg/dL (ref 0.0–40.0)

## 2023-11-18 LAB — CBC WITH DIFFERENTIAL/PLATELET
Basophils Absolute: 0 10*3/uL (ref 0.0–0.1)
Basophils Relative: 0.8 % (ref 0.0–3.0)
Eosinophils Absolute: 0.3 10*3/uL (ref 0.0–0.7)
Eosinophils Relative: 5 % (ref 0.0–5.0)
HCT: 43.3 % (ref 39.0–52.0)
Hemoglobin: 14.4 g/dL (ref 13.0–17.0)
Lymphocytes Relative: 24.3 % (ref 12.0–46.0)
Lymphs Abs: 1.3 10*3/uL (ref 0.7–4.0)
MCHC: 33.4 g/dL (ref 30.0–36.0)
MCV: 92.5 fl (ref 78.0–100.0)
Monocytes Absolute: 0.7 10*3/uL (ref 0.1–1.0)
Monocytes Relative: 12.5 % — ABNORMAL HIGH (ref 3.0–12.0)
Neutro Abs: 3.1 10*3/uL (ref 1.4–7.7)
Neutrophils Relative %: 57.4 % (ref 43.0–77.0)
Platelets: 204 10*3/uL (ref 150.0–400.0)
RBC: 4.68 Mil/uL (ref 4.22–5.81)
RDW: 14.7 % (ref 11.5–15.5)
WBC: 5.5 10*3/uL (ref 4.0–10.5)

## 2023-11-18 LAB — HEMOGLOBIN A1C: Hgb A1c MFr Bld: 5.9 % (ref 4.6–6.5)

## 2023-11-18 LAB — TSH: TSH: 2.21 u[IU]/mL (ref 0.35–5.50)

## 2023-11-18 NOTE — Progress Notes (Signed)
 Subjective:  Patient ID: Chase CHRISTELLA Fairy Mickey., male    DOB: 1945/08/19  Age: 78 y.o. MRN: [id number]  CC: Medical Management of Chronic Issues (3 MNTH F/U )   HPI Chase CHRISTELLA Fairy Mickey. presents for DM, HTN, DM  Outpatient Medications Prior to Visit  Medication Sig Dispense Refill   ALPRAZolam  (XANAX ) 0.5 MG tablet Take 0.5 mg by mouth at bedtime as needed.     amLODipine  (NORVASC ) 10 MG tablet TAKE 1 TABLET BY MOUTH EVERY DAY BEFORE BREAKFAST 90 tablet 3   aspirin EC 81 MG tablet Take 81 mg by mouth daily.     Blood Glucose Monitoring Suppl (ONETOUCH VERIO) w/Device KIT 1 Units by Does not apply route daily as needed. 1 kit 0   carvedilol  (COREG ) 25 MG tablet TAKE 1 TABLET (25 MG TOTAL) BY MOUTH TWICE A DAY WITH MEALS 180 tablet 3   Cholecalciferol (VITAMIN D3) 1000 UNITS tablet Take 2,000 Units by mouth daily.     Coenzyme Q10 (COQ10 MAXIMUM STRENGTH) 400 MG CAPS Take by mouth. Take 1 capsule a day     Folic Acid  0.8 MG CAPS Take 2 capsules by mouth every morning.  30 each 0   glucose blood test strip USE TO TEST BLOOD SUGAR TWICE A DAY 100 each 3   metFORMIN  (GLUCOPHAGE -XR) 750 MG 24 hr tablet TAKE 1 TABLET BY MOUTH DAILY WITH BREAKFAST. ANNUAL APPT DUE IN JULY MUST SEE PROVIDER FOR REFILLS 90 tablet 0   Multiple Vitamins-Minerals (PRESERVISION AREDS PO)      neomycin-polymyxin b-dexamethasone  (MAXITROL) 3.5-10000-0.1 OINT SMARTSIG:sparingly In Eye(s)     OneTouch Delica Lancets 33G MISC USE TO CHECK BLOOD SUGAR TWICE DAILY 100 each 3   ONETOUCH VERIO test strip USE TO CHECK BLOOD SUGAR DAILY 100 strip 5   pioglitazone  (ACTOS ) 45 MG tablet Take 1 tablet (45 mg total) by mouth daily. Follow-appt due in July must see provider for future refills 90 tablet 3   Semaglutide , 1 MG/DOSE, 4 MG/3ML SOPN Inject 1 mg as directed once a week. 9 mL 5   traMADol  (ULTRAM ) 50 MG tablet TAKE 0.5-1 TABLETS (25-50 MG TOTAL) BY MOUTH EVERY 6 (SIX) HOURS AS NEEDED (COUGH). 100 tablet 1   zolpidem  (AMBIEN )  10 MG tablet TAKE 0.5-1 TABLET (5-10 MG TOTAL) BY MOUTH AT BEDTIME AS NEEDED FOR SLEEP. 30 tablet 5   budesonide -formoterol  (SYMBICORT ) 80-4.5 MCG/ACT inhaler Inhale 2 puffs into the lungs in the morning and at bedtime. (Patient not taking: Reported on 11/18/2023)     temazepam  (RESTORIL ) 15 MG capsule Take 1-2 capsules (15-30 mg total) by mouth at bedtime as needed for sleep. (Patient not taking: Reported on 11/18/2023) 60 capsule 3   No facility-administered medications prior to visit.    ROS: Review of Systems  Constitutional:  Negative for appetite change, fatigue and unexpected weight change.  HENT:  Negative for congestion, nosebleeds, sneezing, sore throat and trouble swallowing.   Eyes:  Negative for itching and visual disturbance.  Respiratory:  Negative for cough.   Cardiovascular:  Negative for chest pain, palpitations and leg swelling.  Gastrointestinal:  Negative for abdominal distention, blood in stool, diarrhea and nausea.  Genitourinary:  Negative for frequency and hematuria.  Musculoskeletal:  Negative for back pain, gait problem, joint swelling and neck pain.  Skin:  Negative for rash.  Neurological:  Negative for dizziness, tremors, speech difficulty and weakness.  Psychiatric/Behavioral:  Negative for agitation, dysphoric mood and sleep disturbance. The patient is not nervous/anxious.  Objective:  BP 118/82   Pulse 70   Temp (!) 97.5 F (36.4 C) (Oral)   Ht 5' 4 (1.626 m)   Wt 180 lb (81.6 kg)   SpO2 95%   BMI 30.90 kg/m   BP Readings from Last 3 Encounters:  11/18/23 118/82  08/05/23 120/60  04/21/23 118/70    Wt Readings from Last 3 Encounters:  11/18/23 180 lb (81.6 kg)  10/08/23 179 lb (81.2 kg)  08/05/23 181 lb (82.1 kg)    Physical Exam Constitutional:      General: He is not in acute distress.    Appearance: He is well-developed.     Comments: NAD   Eyes:     Conjunctiva/sclera: Conjunctivae normal.     Pupils: Pupils are equal, round,  and reactive to light.   Neck:     Thyroid : No thyromegaly.     Vascular: No JVD.   Cardiovascular:     Rate and Rhythm: Normal rate and regular rhythm.     Heart sounds: Normal heart sounds. No murmur heard.    No friction rub. No gallop.  Pulmonary:     Effort: Pulmonary effort is normal. No respiratory distress.     Breath sounds: Normal breath sounds. No wheezing or rales.  Chest:     Chest wall: No tenderness.  Abdominal:     General: Bowel sounds are normal. There is no distension.     Palpations: Abdomen is soft. There is no mass.     Tenderness: There is no abdominal tenderness. There is no guarding or rebound.   Musculoskeletal:        General: No tenderness. Normal range of motion.     Cervical back: Normal range of motion.  Lymphadenopathy:     Cervical: No cervical adenopathy.   Skin:    General: Skin is warm and dry.     Findings: No rash.   Neurological:     Mental Status: He is alert and oriented to person, place, and time.     Cranial Nerves: No cranial nerve deficit.     Motor: No abnormal muscle tone.     Coordination: Coordination normal.     Gait: Gait normal.     Deep Tendon Reflexes: Reflexes are normal and symmetric.   Psychiatric:        Behavior: Behavior normal.        Thought Content: Thought content normal.        Judgment: Judgment normal.     Lab Results  Component Value Date   WBC 4.8 06/26/2022   HGB 15.4 06/26/2022   HCT 45.8 06/26/2022   PLT 197.0 06/26/2022   GLUCOSE 113 (H) 08/05/2023   CHOL 207 (H) 11/29/2021   TRIG 81.0 11/29/2021   HDL 85.00 11/29/2021   LDLCALC 106 (H) 11/29/2021   ALT 14 08/05/2023   AST 15 08/05/2023   NA 137 08/05/2023   K 4.1 08/05/2023   CL 103 08/05/2023   CREATININE 0.84 08/05/2023   BUN 13 08/05/2023   CO2 29 08/05/2023   TSH 1.97 11/29/2021   PSA 1.07 06/26/2022   HGBA1C 5.9 08/05/2023   MICROALBUR 1.2 04/21/2023    CT Super D Chest Wo Contrast Result Date: 12/11/2021 CLINICAL  DATA:  Pulmonary nodule. EXAM: CT CHEST WITHOUT CONTRAST TECHNIQUE: Multidetector CT imaging of the chest was performed using thin slice collimation for electromagnetic bronchoscopy planning purposes, without intravenous contrast. RADIATION DOSE REDUCTION: This exam was performed according to the departmental dose-optimization  program which includes automated exposure control, adjustment of the mA and/or kV according to patient size and/or use of iterative reconstruction technique. COMPARISON:  08/29/2021. FINDINGS: Cardiovascular: The heart size is normal. No substantial pericardial effusion. Coronary artery calcification is evident. Mild atherosclerotic calcification is noted in the wall of the thoracic aorta. Mediastinum/Nodes: No mediastinal lymphadenopathy. No evidence for gross hilar lymphadenopathy although assessment is limited by the lack of intravenous contrast on the current study. The esophagus has normal imaging features. There is no axillary lymphadenopathy. Lungs/Pleura: Centrilobular and paraseptal emphysema evident. 5 mm left lower lobe nodule on 95/7 is stable in the interval. The 10 mm new nodule of concern in the subpleural right lower lobe previously has resolved in the interval. No new suspicious pulmonary nodule or mass. Subpleural reticulation again noted bilaterally. No focal airspace consolidation. No pleural effusion. Upper Abdomen: Unremarkable Musculoskeletal: No worrisome lytic or sclerotic osseous abnormality. IMPRESSION: 1. The 10 mm new nodule of concern in the subpleural right lower lobe previously has resolved in the interval. 2. 5 mm left lower lobe pulmonary nodule is stable in the interval. Present since a chest CT of 07/09/2016, this is consistent with benign etiology. 3. Aortic Atherosclerosis (ICD10-I70.0) and Emphysema (ICD10-J43.9). Electronically Signed   By: Camellia Candle M.D.   On: 12/11/2021 13:29    Assessment & Plan:   Problem List Items Addressed This Visit      Dyslipidemia   Relevant Orders   TSH   Lipid panel   Well adult exam - Primary   Relevant Orders   TSH   Urinalysis   CBC with Differential/Platelet   Lipid panel   PSA   Comprehensive metabolic panel with GFR   Hemoglobin A1c   Diabetes type 2, controlled (HCC)   Lost 20 lbs total  On Ozempic , Metfomin XR,  Actos  - take 1/2 tab      Relevant Orders   Comprehensive metabolic panel with GFR   Hemoglobin A1c   Polycythemia   Relevant Orders   CBC with Differential/Platelet   Conjunctival pigmentation   R eye LOW-GRADE CONJUNCTIVAL INTRAEPITHELIAL MELANOCYTIC LESION (PRIMARY ACQUIRED MELANOSIS WITH MINIMAL ATYPIA).          No orders of the defined types were placed in this encounter.     Follow-up: Return in about 4 months (around 03/19/2024) for a follow-up visit.  Marolyn Noel, MD

## 2023-11-18 NOTE — Assessment & Plan Note (Addendum)
 R eye LOW-GRADE CONJUNCTIVAL INTRAEPITHELIAL MELANOCYTIC LESION (PRIMARY ACQUIRED MELANOSIS WITH MINIMAL ATYPIA).

## 2023-11-18 NOTE — Assessment & Plan Note (Signed)
 Lost 20 lbs total  On Ozempic, Metfomin XR,  Actos - take 1/2 tab

## 2023-11-19 ENCOUNTER — Encounter: Payer: Self-pay | Admitting: Internal Medicine

## 2023-11-20 ENCOUNTER — Ambulatory Visit: Payer: Self-pay | Admitting: Internal Medicine

## 2023-11-25 ENCOUNTER — Encounter: Payer: Self-pay | Admitting: Internal Medicine

## 2023-11-27 ENCOUNTER — Other Ambulatory Visit: Payer: Self-pay | Admitting: Internal Medicine

## 2023-11-27 DIAGNOSIS — R3129 Other microscopic hematuria: Secondary | ICD-10-CM

## 2023-11-27 MED ORDER — SEMAGLUTIDE (2 MG/DOSE) 8 MG/3ML ~~LOC~~ SOPN: PEN_INJECTOR | SUBCUTANEOUS | 3 refills | Status: DC

## 2023-11-29 ENCOUNTER — Encounter: Payer: Self-pay | Admitting: Internal Medicine

## 2023-12-05 ENCOUNTER — Other Ambulatory Visit: Payer: Self-pay | Admitting: Internal Medicine

## 2023-12-05 MED ORDER — CIPROFLOXACIN HCL 250 MG PO TABS: 250.0000 mg | ORAL_TABLET | Freq: Two times a day (BID) | ORAL | 0 refills | Status: AC

## 2023-12-08 ENCOUNTER — Telehealth: Payer: Self-pay | Admitting: Family

## 2023-12-10 ENCOUNTER — Other Ambulatory Visit: Payer: Self-pay | Admitting: Internal Medicine

## 2023-12-10 NOTE — Telephone Encounter (Signed)
 Copied from CRM [id number]. Topic: Clinical - Medication Question >> Dec 10, 2023 11:56 AM Burnard DEL wrote: Reason for CRM: Patient called in to request on medication  metFORMIN  (GLUCOPHAGE -XR) 750 MG 24 hr tablet. Request was sent to provider Kenney Roys.  [id number] - [place], [place] - [address]  Phone: [phone] Fax: [phone]

## 2023-12-11 ENCOUNTER — Other Ambulatory Visit: Payer: Self-pay

## 2023-12-11 MED ORDER — METFORMIN HCL ER 750 MG PO TB24: 750.0000 mg | ORAL_TABLET | Freq: Every day | ORAL | 0 refills | Status: DC

## 2023-12-12 ENCOUNTER — Other Ambulatory Visit: Payer: Self-pay | Admitting: Internal Medicine

## 2024-01-14 DIAGNOSIS — H11131 Conjunctival pigmentations, right eye: Secondary | ICD-10-CM | POA: Diagnosis not present

## 2024-02-16 DIAGNOSIS — R3121 Asymptomatic microscopic hematuria: Secondary | ICD-10-CM | POA: Diagnosis not present

## 2024-02-16 DIAGNOSIS — Z87442 Personal history of urinary calculi: Secondary | ICD-10-CM | POA: Diagnosis not present

## 2024-02-20 ENCOUNTER — Encounter: Payer: Self-pay | Admitting: Internal Medicine

## 2024-02-20 ENCOUNTER — Other Ambulatory Visit: Payer: Self-pay

## 2024-02-28 ENCOUNTER — Other Ambulatory Visit: Payer: Self-pay | Admitting: Internal Medicine

## 2024-03-04 ENCOUNTER — Other Ambulatory Visit: Payer: Self-pay | Admitting: Internal Medicine

## 2024-03-04 DIAGNOSIS — I10 Essential (primary) hypertension: Secondary | ICD-10-CM

## 2024-03-06 ENCOUNTER — Other Ambulatory Visit: Payer: Self-pay | Admitting: Internal Medicine

## 2024-03-21 ENCOUNTER — Other Ambulatory Visit: Payer: Self-pay | Admitting: Internal Medicine

## 2024-03-22 ENCOUNTER — Ambulatory Visit: Admitting: Internal Medicine

## 2024-03-25 ENCOUNTER — Encounter: Payer: Self-pay | Admitting: Internal Medicine

## 2024-03-25 ENCOUNTER — Ambulatory Visit (INDEPENDENT_AMBULATORY_CARE_PROVIDER_SITE_OTHER): Admitting: Internal Medicine

## 2024-03-25 VITALS — BP 112/62 | HR 63 | Temp 97.7°F | Ht 64.0 in | Wt 177.4 lb

## 2024-03-25 DIAGNOSIS — E119 Type 2 diabetes mellitus without complications: Secondary | ICD-10-CM

## 2024-03-25 DIAGNOSIS — G8929 Other chronic pain: Secondary | ICD-10-CM

## 2024-03-25 DIAGNOSIS — E785 Hyperlipidemia, unspecified: Secondary | ICD-10-CM | POA: Diagnosis not present

## 2024-03-25 DIAGNOSIS — I700 Atherosclerosis of aorta: Secondary | ICD-10-CM | POA: Diagnosis not present

## 2024-03-25 DIAGNOSIS — I2583 Coronary atherosclerosis due to lipid rich plaque: Secondary | ICD-10-CM

## 2024-03-25 DIAGNOSIS — I10 Essential (primary) hypertension: Secondary | ICD-10-CM

## 2024-03-25 DIAGNOSIS — R0989 Other specified symptoms and signs involving the circulatory and respiratory systems: Secondary | ICD-10-CM

## 2024-03-25 DIAGNOSIS — M5440 Lumbago with sciatica, unspecified side: Secondary | ICD-10-CM

## 2024-03-25 LAB — COMPREHENSIVE METABOLIC PANEL WITH GFR
ALT: 13 U/L (ref 0–53)
AST: 16 U/L (ref 0–37)
Albumin: 3.9 g/dL (ref 3.5–5.2)
Alkaline Phosphatase: 81 U/L (ref 39–117)
BUN: 14 mg/dL (ref 6–23)
CO2: 30 meq/L (ref 19–32)
Calcium: 9 mg/dL (ref 8.4–10.5)
Chloride: 101 meq/L (ref 96–112)
Creatinine, Ser: 0.84 mg/dL (ref 0.40–1.50)
GFR: 82.73 mL/min (ref >60.00)
Glucose, Bld: 97 mg/dL (ref 70–99)
Potassium: 4.2 meq/L (ref 3.5–5.1)
Sodium: 136 meq/L (ref 135–145)
Total Bilirubin: 0.5 mg/dL (ref 0.2–1.2)
Total Protein: 6.6 g/dL (ref 6.0–8.3)

## 2024-03-25 LAB — HEMOGLOBIN A1C: Hgb A1c MFr Bld: 6 % (ref 4.6–6.5)

## 2024-03-25 MED ORDER — METFORMIN HCL ER 500 MG PO TB24: ORAL_TABLET | ORAL | 3 refills | Status: AC

## 2024-03-25 NOTE — Assessment & Plan Note (Signed)
Resolved On Aleve prn Tramadol prn rare Try Blue-Emu cream  Potential benefits of a long term opioids use as well as potential risks (i.e. addiction risk, apnea etc) and complications (i.e. Somnolence, constipation and others) were explained to the patient and were aknowledged.

## 2024-03-25 NOTE — Assessment & Plan Note (Signed)
 Severe on CT F/u w/Cardiology

## 2024-03-25 NOTE — Assessment & Plan Note (Addendum)
 Cont on Coreg , Amlodipine  Lost 25 lbs total

## 2024-03-25 NOTE — Assessment & Plan Note (Signed)
 Statin intolerant Pharmacy ref

## 2024-03-25 NOTE — Assessment & Plan Note (Signed)
 Lost 25 lbs total  On Ozempic , Metfomin XR,  Actos  - take 1/2 tab

## 2024-03-25 NOTE — Progress Notes (Signed)
 Subjective:  Patient ID: [name]., male    DOB: 1945/10/14  Age: 78 y.o. MRN: [id number]  CC: Medical Management of Chronic Issues (4 Month follow up. Review recent CT results, new findings of severe aortic atherosclerosis )   HPI [name]. presents for CAD, DM, HTN  Outpatient Medications Prior to Visit  Medication Sig Dispense Refill   ALPRAZolam  (XANAX ) 0.5 MG tablet Take 0.5 mg by mouth at bedtime as needed.     amLODipine  (NORVASC ) 10 MG tablet TAKE 1 TABLET BY MOUTH EVERY DAY BEFORE BREAKFAST 90 tablet 3   aspirin EC 81 MG tablet Take 81 mg by mouth daily.     Blood Glucose Monitoring Suppl (ONETOUCH VERIO) w/Device KIT 1 Units by Does not apply route daily as needed. 1 kit 0   carvedilol  (COREG ) 25 MG tablet TAKE 1 TABLET (25 MG TOTAL) BY MOUTH TWICE A DAY WITH MEALS 180 tablet 3   Cholecalciferol (VITAMIN D3) 1000 UNITS tablet Take 2,000 Units by mouth daily.     ciprofloxacin  (CIPRO ) 250 MG tablet Take 1 tablet (250 mg total) by mouth 2 (two) times daily. 20 tablet 0   Coenzyme Q10 (COQ10 MAXIMUM STRENGTH) 400 MG CAPS Take by mouth. Take 1 capsule a day     Folic Acid  0.8 MG CAPS Take 2 capsules by mouth every morning.  30 each 0   glucose blood test strip USE TO TEST BLOOD SUGAR TWICE A DAY 100 each 3   Multiple Vitamins-Minerals (PRESERVISION AREDS PO)      neomycin-polymyxin b-dexamethasone  (MAXITROL) 3.5-10000-0.1 OINT SMARTSIG:sparingly In Eye(s)     OneTouch Delica Lancets 33G MISC USE TO CHECK BLOOD SUGAR TWICE DAILY 100 each 3   Semaglutide , 2 MG/DOSE, (OZEMPIC , 2 MG/DOSE,) 8 MG/3ML SOPN 2 MG SQ WEEKLY 3 mL 5   traMADol  (ULTRAM ) 50 MG tablet TAKE 0.5-1 TABLETS (25-50 MG TOTAL) BY MOUTH EVERY 6 (SIX) HOURS AS NEEDED (COUGH). 100 tablet 1   zolpidem  (AMBIEN ) 10 MG tablet TAKE 0.5-1 TABLET (5-10 MG TOTAL) BY MOUTH AT BEDTIME AS NEEDED FOR SLEEP. 30 tablet 5   metFORMIN  (GLUCOPHAGE -XR) 750 MG 24 hr tablet TAKE 1 TABLET BY MOUTH EVERY DAY WITH  BREAKFAST 90 tablet 3   pioglitazone  (ACTOS ) 45 MG tablet TAKE 1 TABLET BY MOUTH DAILY. FOLLOW-APPT DUE IN JULY MUST SEE PROVIDER FOR FUTURE REFILLS 90 tablet 3   budesonide -formoterol  (SYMBICORT ) 80-4.5 MCG/ACT inhaler Inhale 2 puffs into the lungs in the morning and at bedtime. (Patient not taking: Reported on 03/25/2024)     temazepam  (RESTORIL ) 15 MG capsule Take 1-2 capsules (15-30 mg total) by mouth at bedtime as needed for sleep. (Patient not taking: Reported on 03/25/2024) 60 capsule 3   No facility-administered medications prior to visit.    ROS: Review of Systems  Constitutional:  Negative for appetite change, fatigue and unexpected weight change.  HENT:  Negative for congestion, nosebleeds, sneezing, sore throat and trouble swallowing.   Eyes:  Negative for itching and visual disturbance.  Respiratory:  Negative for cough.   Cardiovascular:  Negative for chest pain, palpitations and leg swelling.  Gastrointestinal:  Negative for abdominal distention, blood in stool, diarrhea and nausea.  Genitourinary:  Negative for frequency and hematuria.  Musculoskeletal:  Positive for back pain. Negative for arthralgias, gait problem, joint swelling and neck pain.  Skin:  Negative for rash.  Neurological:  Negative for dizziness, tremors, speech difficulty and weakness.  Psychiatric/Behavioral:  Negative for agitation, dysphoric mood and  sleep disturbance. The patient is not nervous/anxious.     Objective:  BP 112/62   Pulse 63   Temp 97.7 F (36.5 C)   Ht 5' 4 (1.626 m)   Wt 177 lb 6.4 oz (80.5 kg)   SpO2 99%   BMI 30.45 kg/m   BP Readings from Last 3 Encounters:  03/25/24 112/62  11/18/23 118/82  08/05/23 120/60    Wt Readings from Last 3 Encounters:  03/25/24 177 lb 6.4 oz (80.5 kg)  11/18/23 180 lb (81.6 kg)  10/08/23 179 lb (81.2 kg)    Physical Exam Constitutional:      General: He is not in acute distress.    Appearance: He is well-developed.     Comments: NAD   Eyes:     Conjunctiva/sclera: Conjunctivae normal.     Pupils: Pupils are equal, round, and reactive to light.  Neck:     Thyroid : No thyromegaly.     Vascular: No JVD.  Cardiovascular:     Rate and Rhythm: Normal rate and regular rhythm.     Heart sounds: Murmur heard.     No friction rub. No gallop.  Pulmonary:     Effort: Pulmonary effort is normal. No respiratory distress.     Breath sounds: Normal breath sounds. No wheezing or rales.  Chest:     Chest wall: No tenderness.  Abdominal:     General: Bowel sounds are normal. There is no distension.     Palpations: Abdomen is soft. There is no mass.     Tenderness: There is no abdominal tenderness. There is no guarding or rebound.  Musculoskeletal:        General: No tenderness. Normal range of motion.     Cervical back: Normal range of motion.  Lymphadenopathy:     Cervical: No cervical adenopathy.  Skin:    General: Skin is warm and dry.     Findings: No rash.  Neurological:     Mental Status: He is alert and oriented to person, place, and time.     Cranial Nerves: No cranial nerve deficit.     Motor: No abnormal muscle tone.     Coordination: Coordination normal.     Gait: Gait normal.     Deep Tendon Reflexes: Reflexes are normal and symmetric.  Psychiatric:        Behavior: Behavior normal.        Thought Content: Thought content normal.        Judgment: Judgment normal.    Car bruit mild  Lab Results  Component Value Date   WBC 5.5 11/18/2023   HGB 14.4 11/18/2023   HCT 43.3 11/18/2023   PLT 204.0 11/18/2023   GLUCOSE 100 (H) 11/18/2023   CHOL 213 (H) 11/18/2023   TRIG 82.0 11/18/2023   HDL 69.60 11/18/2023   LDLCALC 127 (H) 11/18/2023   ALT 13 11/18/2023   AST 15 11/18/2023   NA 138 11/18/2023   K 4.6 11/18/2023   CL 101 11/18/2023   CREATININE 0.83 11/18/2023   BUN 15 11/18/2023   CO2 32 11/18/2023   TSH 2.21 11/18/2023   PSA 1.21 11/18/2023   HGBA1C 5.9 11/18/2023    CT Super D Chest Wo  Contrast Result Date: 12/11/2021 CLINICAL DATA:  Pulmonary nodule. EXAM: CT CHEST WITHOUT CONTRAST TECHNIQUE: Multidetector CT imaging of the chest was performed using thin slice collimation for electromagnetic bronchoscopy planning purposes, without intravenous contrast. RADIATION DOSE REDUCTION: This exam was performed according to the departmental  dose-optimization program which includes automated exposure control, adjustment of the mA and/or kV according to patient size and/or use of iterative reconstruction technique. COMPARISON:  08/29/2021. FINDINGS: Cardiovascular: The heart size is normal. No substantial pericardial effusion. Coronary artery calcification is evident. Mild atherosclerotic calcification is noted in the wall of the thoracic aorta. Mediastinum/Nodes: No mediastinal lymphadenopathy. No evidence for gross hilar lymphadenopathy although assessment is limited by the lack of intravenous contrast on the current study. The esophagus has normal imaging features. There is no axillary lymphadenopathy. Lungs/Pleura: Centrilobular and paraseptal emphysema evident. 5 mm left lower lobe nodule on 95/7 is stable in the interval. The 10 mm new nodule of concern in the subpleural right lower lobe previously has resolved in the interval. No new suspicious pulmonary nodule or mass. Subpleural reticulation again noted bilaterally. No focal airspace consolidation. No pleural effusion. Upper Abdomen: Unremarkable Musculoskeletal: No worrisome lytic or sclerotic osseous abnormality. IMPRESSION: 1. The 10 mm new nodule of concern in the subpleural right lower lobe previously has resolved in the interval. 2. 5 mm left lower lobe pulmonary nodule is stable in the interval. Present since a chest CT of 07/09/2016, this is consistent with benign etiology. 3. Aortic Atherosclerosis (ICD10-I70.0) and Emphysema (ICD10-J43.9). Electronically Signed   By: Camellia Candle M.D.   On: 12/11/2021 13:29    Assessment & Plan:    Problem List Items Addressed This Visit     Aortic atherosclerosis   Severe on CT F/u w/Cardiology      Relevant Orders   Ambulatory referral to Cardiology   Hemoglobin A1c   AMB Referral [facility]BCI Care Management   Diabetes type 2, controlled (HCC) - Primary   Lost 25 lbs total  On Ozempic , Metfomin XR,  Actos  - take 1/2 tab      Relevant Medications   metFORMIN  (GLUCOPHAGE -XR) 500 MG 24 hr tablet   Other Relevant Orders   Comprehensive metabolic panel with GFR   Hemoglobin A1c   AMB Referral [facility]BCI Care Management   Dyslipidemia   Statin intolerant Pharmacy ref      Essential hypertension   Cont on Coreg , Amlodipine  Lost 25 lbs total       Low back pain   Resolved On Aleve prn Tramadol  prn rare Try Blue-Emu cream  Potential benefits of a long term opioids use as well as potential risks (i.e. addiction risk, apnea etc) and complications (i.e. Somnolence, constipation and others) were explained to the patient and were aknowledged.      Other Visit Diagnoses       Coronary atherosclerosis due to lipid rich plaque       Relevant Orders   Ambulatory referral to Cardiology   AMB Referral [facility]BCI Care Management     Bilateral carotid bruits       Relevant Orders   US  Carotid Bilateral         Meds ordered this encounter  Medications   metFORMIN  (GLUCOPHAGE -XR) 500 MG 24 hr tablet    Sig: 1 po qd    Dispense:  90 tablet    Refill:  3      Follow-up: Return in about 3 months (around 06/25/2024) for a follow-up visit.  Marolyn Noel, MD

## 2024-03-26 ENCOUNTER — Encounter: Payer: Self-pay | Admitting: Internal Medicine

## 2024-03-29 ENCOUNTER — Ambulatory Visit: Payer: Self-pay | Admitting: Internal Medicine

## 2024-03-31 ENCOUNTER — Telehealth: Payer: Self-pay | Admitting: *Deleted

## 2024-03-31 NOTE — Progress Notes (Unsigned)
 Care Guide Pharmacy Note  03/31/2024 Name: [name]. MRN: [id number] DOB: 1946/05/17  Referred By: Garald Karlynn GAILS, MD Reason for referral: Call Attempt #1 (Outreach to schedule referral with pharmacist )   Chase CHRISTELLA Fairy Mickey. is a 78 y.o. year old male who is a primary care patient of Plotnikov, Karlynn GAILS, MD.  Chase CHRISTELLA Fairy Mickey. was referred to the pharmacist for assistance related to: HLD  An unsuccessful telephone outreach was attempted today to contact the patient who was referred to the pharmacy team for assistance with medication assistance. Additional attempts will be made to contact the patient.  [name], CMA [place]  [facility], [facility] Guide Direct Dial: [phone]  Fax: [phone] Website: [email or web address]

## 2024-04-01 NOTE — Progress Notes (Unsigned)
 Care Guide Pharmacy Note  04/01/2024 Name: [name]. MRN: [id number] DOB: 02-02-1946  Referred By: Garald Karlynn GAILS, MD Reason for referral: Call Attempt #1 (Outreach to schedule referral with pharmacist )   Chase CHRISTELLA Fairy Mickey. is a 78 y.o. year old male who is a primary care patient of Plotnikov, Karlynn GAILS, MD.  Chase CHRISTELLA Fairy Mickey. was referred to the pharmacist for assistance related to: HLD  A second unsuccessful telephone outreach was attempted today to contact the patient who was referred to the pharmacy team for assistance with medication assistance. Additional attempts will be made to contact the patient.  Thedford Franks, CMA [place]  [facility], [facility] Guide Direct Dial: [phone]  Fax: [phone] Website: [email or web address]

## 2024-04-02 NOTE — Progress Notes (Signed)
 Care Guide Pharmacy Note  04/02/2024 Name: [name]. MRN: [id number] DOB: Sep 04, 1945  Referred By: Garald Karlynn GAILS, MD Reason for referral: Call Attempt #1 (Outreach to schedule referral with pharmacist )   Chase CHRISTELLA Fairy Mickey. is a 78 y.o. year old male who is a primary care patient of Plotnikov, Karlynn GAILS, MD.  Chase CHRISTELLA Fairy Mickey. was referred to the pharmacist for assistance related to: HLD  A third unsuccessful telephone outreach was attempted today to contact the patient who was referred to the pharmacy team for assistance with medication management. The Population Health team is pleased to engage with this patient at any time in the future upon receipt of referral and should he/she be interested in assistance from the Population Health team.  Thedford Franks, CMA [facility]  [facility], [facility] Guide Direct Dial: [phone]  Fax: [phone] Website: [email or web address]

## 2024-04-06 ENCOUNTER — Encounter: Payer: Self-pay | Admitting: Internal Medicine

## 2024-04-12 LAB — OPHTHALMOLOGY REPORT-SCANNED

## 2024-04-16 LAB — OPHTHALMOLOGY REPORT-SCANNED

## 2024-05-12 ENCOUNTER — Ambulatory Visit: Admitting: Cardiovascular Disease

## 2024-05-12 ENCOUNTER — Encounter: Payer: Self-pay | Admitting: Cardiovascular Disease

## 2024-05-12 ENCOUNTER — Telehealth: Payer: Self-pay | Admitting: Pharmacy Technician

## 2024-05-12 ENCOUNTER — Other Ambulatory Visit (HOSPITAL_COMMUNITY): Payer: Self-pay

## 2024-05-12 VITALS — BP 104/60 | HR 63 | Ht 64.0 in | Wt 175.0 lb

## 2024-05-12 DIAGNOSIS — R0989 Other specified symptoms and signs involving the circulatory and respiratory systems: Secondary | ICD-10-CM

## 2024-05-12 DIAGNOSIS — I700 Atherosclerosis of aorta: Secondary | ICD-10-CM

## 2024-05-12 DIAGNOSIS — R011 Cardiac murmur, unspecified: Secondary | ICD-10-CM

## 2024-05-12 DIAGNOSIS — I2510 Atherosclerotic heart disease of native coronary artery without angina pectoris: Secondary | ICD-10-CM | POA: Diagnosis not present

## 2024-05-12 DIAGNOSIS — I10 Essential (primary) hypertension: Secondary | ICD-10-CM | POA: Diagnosis not present

## 2024-05-12 DIAGNOSIS — T466X5A Adverse effect of antihyperlipidemic and antiarteriosclerotic drugs, initial encounter: Secondary | ICD-10-CM

## 2024-05-12 DIAGNOSIS — E7800 Pure hypercholesterolemia, unspecified: Secondary | ICD-10-CM | POA: Diagnosis not present

## 2024-05-12 DIAGNOSIS — G720 Drug-induced myopathy: Secondary | ICD-10-CM | POA: Diagnosis not present

## 2024-05-12 MED ORDER — REPATHA SURECLICK 140 MG/ML ~~LOC~~ SOAJ: 140.0000 mg | SUBCUTANEOUS | 3 refills | Status: AC

## 2024-05-12 MED ORDER — CARVEDILOL 12.5 MG PO TABS: 12.5000 mg | ORAL_TABLET | Freq: Two times a day (BID) | ORAL | 3 refills | Status: AC

## 2024-05-12 NOTE — Progress Notes (Signed)
 Patient ID: Chase Schamp., male   DOB: 11-Jan-1946, 78 y.o.   MRN: [id number]     Cardiology Office Note    Date:  05/12/2024   ID:  Chase CHRISTELLA Clevester Helzer., DOB Feb 25, 1946, MRN [id number]  PCP:  Garald Karlynn GAILS, MD  Cardiologist:   Jerel Balding, MD   Chief Complaint  Patient presents with   Heart Murmur    History of Present Illness:  Chase Embleton. is a 78 y.o. male with cardiac murmur and incidentally discovered coronary artery calcifications on CT of the chest, aortic atherosclerosis and multiple coronary risk factors that include type 2 diabetes mellitus, hyperlipidemia and hypertension.  This is his first cardiology visit since 2018.  Previous workup with echocardiography showed aortic valve sclerosis without stenosis and his nuclear perfusion study was a low risk test.  Both studies showed normal LVEF and regional wall motion.  He returns for follow-up on multiple noninvasive studies. His echocardiogram shows aortic valve sclerosis without significant stenosis. His treadmill stress test was nondiagnostic due to inadequate heart rate and a follow-up Lexiscan  Myoview showed inferior diaphragmatic attenuation artifact. Ejection fraction was normal on the echocardiogram and calculated borderline at 54% on the nuclear scintigram. Both studies showed normal regional wall motion.  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  He has occasional dizzy spells especially with changes in position and his blood pressure today is 104/60, not much higher than his recent PCP appointment when it was 112/62.  He does get some shortness of breath climbing stairs, which he attributes to his smoking history, but which was not a problem when he was last seen in our office.  The patient specifically denies any chest pain at rest or with exertion, dyspnea at rest, orthopnea, paroxysmal nocturnal dyspnea, syncope, palpitations, focal  neurological deficits, intermittent claudication, lower extremity edema, unexplained weight gain, cough, hemoptysis or wheezing.  He does have a remote history of cough syncope but has not had any problems recently.  He has a strong family history of premature death due to coronary artery disease.  He was intolerant to atorvastatin  and rosuvastatin .  For a while he seemed to be okay on intermittent doses of pravastatin , but he has been off any lipid-lowering medications for years now due to musculoskeletal complaints  His most recent lipid profile shows an LDL cholesterol of 127, but also an excellent HDL of 70.  Hemoglobin A1c was most recently 6.0%.  He has normal renal function.  He does not smoke for the last 22 years, but has a 60-pack-year history of smoking leading up to 2003.  Past Medical History:  Diagnosis Date   Asthma    mild, chronic cough   Chronic kidney disease    kidney stone and infection- started 03-27-11   COPD (chronic obstructive pulmonary disease) (HCC)    Diabetes mellitus    GERD (gastroesophageal reflux disease)    Hyperlipidemia    Hypertension    Obesity     Past Surgical History:  Procedure Laterality Date   APPENDECTOMY  1963   CYSTOSCOPY W/ URETERAL STENT PLACEMENT  03/26/11   removal of kidney stone   MASS EXCISION Left 06/16/2020   Procedure: MINOR EXCISION OF MASS;  Surgeon: Jesus Oliphant, MD;  Location: [facility];  Service: ENT;  Laterality: Left;    Current Medications: Outpatient Medications Prior to Visit  Medication Sig Dispense Refill   ALPRAZolam  (XANAX ) 0.5 MG tablet  Take 0.5 mg by mouth at bedtime as needed.     amLODipine  (NORVASC ) 10 MG tablet TAKE 1 TABLET BY MOUTH EVERY DAY BEFORE BREAKFAST 90 tablet 3   aspirin EC 81 MG tablet Take 81 mg by mouth daily.     Blood Glucose Monitoring Suppl (ONETOUCH VERIO) w/Device KIT 1 Units by Does not apply route daily as needed. 1 kit 0   budesonide -formoterol  (SYMBICORT ) 80-4.5  MCG/ACT inhaler Inhale 2 puffs into the lungs in the morning and at bedtime.     Cholecalciferol (VITAMIN D3) 1000 UNITS tablet Take 2,000 Units by mouth daily.     ciprofloxacin  (CIPRO ) 250 MG tablet Take 1 tablet (250 mg total) by mouth 2 (two) times daily. 20 tablet 0   Coenzyme Q10 (COQ10 MAXIMUM STRENGTH) 400 MG CAPS Take by mouth. Take 1 capsule a day     Folic Acid  0.8 MG CAPS Take 2 capsules by mouth every morning.  30 each 0   glucose blood test strip USE TO TEST BLOOD SUGAR TWICE A DAY 100 each 3   metFORMIN  (GLUCOPHAGE -XR) 500 MG 24 hr tablet 1 po qd 90 tablet 3   Multiple Vitamins-Minerals (PRESERVISION AREDS PO)      neomycin-polymyxin b-dexamethasone  (MAXITROL) 3.5-10000-0.1 OINT SMARTSIG:sparingly In Eye(s)     OneTouch Delica Lancets 33G MISC USE TO CHECK BLOOD SUGAR TWICE DAILY 100 each 3   Semaglutide , 2 MG/DOSE, (OZEMPIC , 2 MG/DOSE,) 8 MG/3ML SOPN 2 MG SQ WEEKLY 3 mL 5   temazepam  (RESTORIL ) 15 MG capsule Take 1-2 capsules (15-30 mg total) by mouth at bedtime as needed for sleep. 60 capsule 3   traMADol  (ULTRAM ) 50 MG tablet TAKE 0.5-1 TABLETS (25-50 MG TOTAL) BY MOUTH EVERY 6 (SIX) HOURS AS NEEDED (COUGH). 100 tablet 1   zolpidem  (AMBIEN ) 10 MG tablet TAKE 0.5-1 TABLET (5-10 MG TOTAL) BY MOUTH AT BEDTIME AS NEEDED FOR SLEEP. 30 tablet 5   carvedilol  (COREG ) 25 MG tablet TAKE 1 TABLET (25 MG TOTAL) BY MOUTH TWICE A DAY WITH MEALS 180 tablet 3   No facility-administered medications prior to visit.     Allergies:   Fish oil-krill oil, Olmesartan , Other, Pitavastatin , Pravastatin , and Symbicort  [budesonide -formoterol  fumarate]     Family History:  The patient's family history includes Coronary artery disease in an other family member; Diabetes in his brother, father, and sister; Heart disease (age of onset: 73) in his father; Heart disease (age of onset: 19) in his sister; Hypertension in his brother, father, and another family member; Multiple sclerosis in his mother;  Parkinsonism in his brother.   ROS:   Please see the history of present illness.    ROS All other systems reviewed and are negative.   PHYSICAL EXAM:   VS:  BP 104/60   Pulse 63   Ht 5' 4 (1.626 m)   Wt 175 lb (79.4 kg)   SpO2 94%   BMI 30.04 kg/m     General: Alert, oriented x3, no distress, borderline obese Head: no evidence of trauma, PERRL, EOMI, no exophtalmos or lid lag, no myxedema, no xanthelasma; normal ears, nose and oropharynx Neck: normal jugular venous pulsations and no hepatojugular reflux; brisk carotid pulses without delay and no carotid bruits Chest: clear to auscultation, no signs of consolidation by percussion or palpation, normal fremitus, symmetrical and full respiratory excursions Cardiovascular: normal position and quality of the apical impulse, regular rhythm, normal first and still distinct second heart sounds, 2/6 mid peaking aortic ejection murmur, no systolic murmurs, rubs  or gallops Abdomen: no tenderness or distention, no masses by palpation, no abnormal pulsatility or arterial bruits, normal bowel sounds, no hepatosplenomegaly Extremities: no clubbing, cyanosis or edema; 2+ radial, ulnar and brachial pulses bilaterally; 2+ right femoral, posterior tibial and dorsalis pedis pulses; 2+ left femoral, posterior tibial and dorsalis pedis pulses; no subclavian or femoral bruits Neurological: grossly nonfocal Psych: Normal mood and affect   Wt Readings from Last 3 Encounters:  05/12/24 175 lb (79.4 kg)  03/25/24 177 lb 6.4 oz (80.5 kg)  11/18/23 180 lb (81.6 kg)      Studies/Labs Reviewed:   EKG:    EKG Interpretation Date/Time:  Wednesday May 12 2024 09:35:47 EST Ventricular Rate:  63 PR Interval:  254 QRS Duration:  88 QT Interval:  404 QTC Calculation: 413 R Axis:   -26  Text Interpretation: Sinus rhythm with 1st degree A-V block Minimal voltage criteria for LVH, may be normal variant ( R in aVL ) When compared with ECG of 26-Mar-2011  06:51, PR interval has increased Confirmed by Ramiah Helfrich ([id number]) on 05/12/2024 9:42:48 AM         Recent Labs: 11/18/2023: Hemoglobin 14.4; Platelets 204.0; TSH 2.21 03/25/2024: ALT 13; BUN 14; Creatinine, Ser 0.84; Potassium 4.2; Sodium 136   Lipid Panel    Component Value Date/Time   CHOL 213 (H) 11/18/2023 0930   TRIG 82.0 11/18/2023 0930   HDL 69.60 11/18/2023 0930   CHOLHDL 3 11/18/2023 0930   VLDL 16.4 11/18/2023 0930   LDLCALC 127 (H) 11/18/2023 0930     ASSESSMENT:    1. Murmur   2. Coronary artery calcification   3. Aortic atherosclerosis   4. Essential hypertension   5. Hypercholesterolemia   6. Carotid bruit, unspecified laterality   7. Statin myopathy      PLAN:  In order of problems listed above:  AS: The murmur has definitely become more pronounced and I suspect he now has aortic stenosis, but there are no signs of severe AS.  He does have some exertional dyspnea.  Recommend follow-up echocardiogram.   Carotid bruit: With prominent atherosclerotic disease seen at least in the common carotids on the recent CT.  Dr. Garald has already ordered carotid duplex ultrasound studies but these have not been scheduled.  Will try to schedule them around the time of his echocardiogram. Aortic atherosclerosis/coronary arthrosclerosis: Prominent even severe calcifications are seen in the LAD artery and also probably the right coronary artery, but with motion artifact it is hard to say.  Also has significant atherosclerosis in the carotid arteries bilaterally on CT.  All of these appear to be asymptomatic.  Low risk nuclear stress test in 2018.  Focus on risk factor treatment aggressively.  HTN: Excellent blood pressure control, in fact with a rather low systolic and diastolic blood pressure readings and some dizziness.  Reduce the carvedilol  to 12.5 mg twice daily. DM:  Excellent glycemic control on metformin  and Ozempic .  Most recent hemoglobin A1c 6%. HLP: Target  LDL cholesterol preferably less than 70.  At his previous visit 8 years ago his LDL was at least less than 100 while he was taking pravastatin , but he has now been off lipid-lowering medications for several years.  He had statin myopathy with 3 different agents: Atorvastatin , rosuvastatin , pravastatin .  Discussed the new positive data on primary prevention with Repatha .  Will prescribe Repatha  140 mg every other week.  Recheck lipid profile in 3 months.    Medication Adjustments/Labs and Tests Ordered: Current medicines  are reviewed at length with the patient today.  Concerns regarding medicines are outlined above.  Medication changes, Labs and Tests ordered today are listed in the Patient Instructions below. Patient Instructions  Medication Instructions:  Decrease Carvedilol  12.5 mg twice daily Start Repatha  140 mg every 14 days- subcutaneous injection (prior authorization started) *If you need a refill on your cardiac medications before your next appointment, please call your pharmacy*  Lab Work: Fasting lipid panel- 3 months. If you have labs (blood work) drawn today and your tests are completely normal, you will receive your results only by: MyChart Message (if you have MyChart) OR A paper copy in the mail If you have any lab test that is abnormal or we need to change your treatment, we will call you to review the results.  Testing/Procedures: Your physician has requested that you have a carotid duplex. This test is an ultrasound of the carotid arteries in your neck. It looks at blood flow through these arteries that supply the brain with blood. Allow one hour for this exam. There are no restrictions or special instructions.    Your physician has requested that you have an echocardiogram. Echocardiography is a painless test that uses sound waves to create images of your heart. It provides your doctor with information about the size and shape of your heart and how well your hearts chambers  and valves are working. This procedure takes approximately one hour. There are no restrictions for this procedure. Please do NOT wear cologne, perfume, aftershave, or lotions (deodorant is allowed). Please arrive 15 minutes prior to your appointment time.  Please note: We ask at that you not bring children with you during ultrasound (echo/ vascular) testing. Due to room size and safety concerns, children are not allowed in the ultrasound rooms during exams. Our front office staff cannot provide observation of children in our lobby area while testing is being conducted. An adult accompanying a patient to their appointment will only be allowed in the ultrasound room at the discretion of the ultrasound technician under special circumstances. We apologize for any inconvenience.   Follow-Up: At [facility], you and your health needs are our priority.  As part of our continuing mission to provide you with exceptional heart care, our providers are all part of one team.  This team includes your primary Cardiologist (physician) and Advanced Practice Providers or APPs (Physician Assistants and Nurse Practitioners) who all work together to provide you with the care you need, when you need it.  Your next appointment:   1 year(s)  Provider:   Dr Francyne  We recommend signing up for the patient portal called MyChart.  Sign up information is provided on this After Visit Summary.  MyChart is used to connect with patients for Virtual Visits (Telemedicine).  Patients are able to view lab/test results, encounter notes, upcoming appointments, etc.  Non-urgent messages can be sent to your provider as well.   To learn more about what you can do with MyChart, go to [email or web address].      Signed, Jerel Francyne, MD  05/12/2024 8:00 PM    [facility] [address] [place], [place], [place]  [address] Phone: [phone]; Fax: [phone]

## 2024-05-12 NOTE — Telephone Encounter (Signed)
 Pharmacy Patient Advocate Encounter  Received notification from [facility] that Prior Authorization for repatha  has been APPROVED from 05/12/24 to 11/10/24. Unable to obtain price due to refill too soon rejection, last fill date 05/12/24 next available fill date02/18/26   PA #/Case ID/Reference #: [id number]

## 2024-05-12 NOTE — Telephone Encounter (Signed)
 Pharmacy Patient Advocate Encounter   Received notification from Physician's Office that prior authorization for Repatha  is required/requested.   Insurance verification completed.   The patient is insured through [employer].   Per test claim: PA required; PA submitted to above mentioned insurance via Latent Key/confirmation #/EOC [id number] Status is pending

## 2024-05-12 NOTE — Patient Instructions (Addendum)
 Medication Instructions:  Decrease Carvedilol  12.5 mg twice daily Start Repatha  140 mg every 14 days- subcutaneous injection (prior authorization started) *If you need a refill on your cardiac medications before your next appointment, please call your pharmacy*  Lab Work: Fasting lipid panel- 3 months. If you have labs (blood work) drawn today and your tests are completely normal, you will receive your results only by: MyChart Message (if you have MyChart) OR A paper copy in the mail If you have any lab test that is abnormal or we need to change your treatment, we will call you to review the results.  Testing/Procedures: Your physician has requested that you have a carotid duplex. This test is an ultrasound of the carotid arteries in your neck. It looks at blood flow through these arteries that supply the brain with blood. Allow one hour for this exam. There are no restrictions or special instructions.    Your physician has requested that you have an echocardiogram. Echocardiography is a painless test that uses sound waves to create images of your heart. It provides your doctor with information about the size and shape of your heart and how well your hearts chambers and valves are working. This procedure takes approximately one hour. There are no restrictions for this procedure. Please do NOT wear cologne, perfume, aftershave, or lotions (deodorant is allowed). Please arrive 15 minutes prior to your appointment time.  Please note: We ask at that you not bring children with you during ultrasound (echo/ vascular) testing. Due to room size and safety concerns, children are not allowed in the ultrasound rooms during exams. Our front office staff cannot provide observation of children in our lobby area while testing is being conducted. An adult accompanying a patient to their appointment will only be allowed in the ultrasound room at the discretion of the ultrasound technician under special  circumstances. We apologize for any inconvenience.   Follow-Up: At [facility], you and your health needs are our priority.  As part of our continuing mission to provide you with exceptional heart care, our providers are all part of one team.  This team includes your primary Cardiologist (physician) and Advanced Practice Providers or APPs (Physician Assistants and Nurse Practitioners) who all work together to provide you with the care you need, when you need it.  Your next appointment:   1 year(s)  Provider:   Dr Francyne  We recommend signing up for the patient portal called MyChart.  Sign up information is provided on this After Visit Summary.  MyChart is used to connect with patients for Virtual Visits (Telemedicine).  Patients are able to view lab/test results, encounter notes, upcoming appointments, etc.  Non-urgent messages can be sent to your provider as well.   To learn more about what you can do with MyChart, go to [email or web address].

## 2024-05-24 ENCOUNTER — Encounter: Payer: Self-pay | Admitting: *Deleted

## 2024-05-24 NOTE — Progress Notes (Signed)
 [name].                                          MRN: [id number]   05/24/2024   The VBCI Quality Team Specialist reviewed this patient medical record for the purposes of chart review for care gap closure. The following were reviewed: chart review for care gap closure-kidney health evaluation for diabetes:eGFR  and uACR.    VBCI Quality Team

## 2024-06-03 ENCOUNTER — Encounter: Payer: Self-pay | Admitting: Cardiovascular Disease

## 2024-06-04 ENCOUNTER — Encounter: Payer: Self-pay | Admitting: Internal Medicine

## 2024-06-10 ENCOUNTER — Telehealth: Payer: Self-pay | Admitting: Cardiovascular Disease

## 2024-06-10 NOTE — Telephone Encounter (Signed)
 Wife Wyman) stated patient's insurance is requiring a prior authorization to have echocardiogram and carotid tests.  Wife wants a call back to confirm prior authorization.  Wife noted tests are scheduled for 1/26.

## 2024-06-21 ENCOUNTER — Other Ambulatory Visit (HOSPITAL_COMMUNITY): Payer: Self-pay

## 2024-06-21 ENCOUNTER — Telehealth: Payer: Self-pay

## 2024-06-21 ENCOUNTER — Ambulatory Visit (HOSPITAL_COMMUNITY): Payer: Medicare (Managed Care)

## 2024-06-21 ENCOUNTER — Ambulatory Visit (HOSPITAL_COMMUNITY): Admission: RE | Admit: 2024-06-21 | Payer: Medicare (Managed Care)

## 2024-06-21 NOTE — Telephone Encounter (Signed)
 Pharmacy Patient Advocate Encounter   Received notification from [facility] that prior authorization for Ozempic  8 is required/requested.   Insurance verification completed.   The patient is insured through [employer].   Per test claim: insurance is inactive

## 2024-06-22 ENCOUNTER — Other Ambulatory Visit (HOSPITAL_COMMUNITY): Payer: Self-pay

## 2024-06-28 ENCOUNTER — Telehealth: Payer: Self-pay

## 2024-06-28 ENCOUNTER — Other Ambulatory Visit (HOSPITAL_COMMUNITY): Payer: Self-pay

## 2024-06-28 ENCOUNTER — Encounter: Payer: Self-pay | Admitting: Internal Medicine

## 2024-06-29 ENCOUNTER — Ambulatory Visit: Admitting: Internal Medicine

## 2024-06-29 NOTE — Telephone Encounter (Signed)
 Patient called I stating that he needs a PA done for ozempic  with his new insurance that is on file.

## 2024-06-30 ENCOUNTER — Encounter: Payer: Self-pay | Admitting: Internal Medicine

## 2024-06-30 ENCOUNTER — Other Ambulatory Visit: Payer: Self-pay

## 2024-06-30 ENCOUNTER — Other Ambulatory Visit (HOSPITAL_COMMUNITY): Payer: Self-pay

## 2024-07-01 ENCOUNTER — Telehealth: Payer: Self-pay | Admitting: Internal Medicine

## 2024-07-01 ENCOUNTER — Other Ambulatory Visit: Payer: Self-pay

## 2024-07-01 DIAGNOSIS — E119 Type 2 diabetes mellitus without complications: Secondary | ICD-10-CM

## 2024-07-01 MED ORDER — OZEMPIC (2 MG/DOSE) 8 MG/3ML ~~LOC~~ SOPN: PEN_INJECTOR | SUBCUTANEOUS | 5 refills | Status: AC

## 2024-07-01 NOTE — Telephone Encounter (Unsigned)
 Copied from CRM [id number]. Topic: Clinical - Prescription Issue >> Jul 01, 2024  8:40 AM Tonda B wrote: Reason for CRM: patients Ozempic  has been denied please call pt back they have questions [phone]

## 2024-07-01 NOTE — Telephone Encounter (Signed)
 Pt has stated he is needing a PA for his Ozempic  as he due for his injection today.  Dx Code for this medication is E11.9

## 2024-07-02 ENCOUNTER — Other Ambulatory Visit (HOSPITAL_COMMUNITY): Payer: Self-pay

## 2024-07-02 NOTE — Telephone Encounter (Signed)
 Med has since been picked up and patient noted all is well

## 2024-07-08 ENCOUNTER — Ambulatory Visit (HOSPITAL_COMMUNITY): Admission: RE | Admit: 2024-07-08 | Payer: Medicare (Managed Care) | Source: Ambulatory Visit

## 2024-07-08 ENCOUNTER — Ambulatory Visit (HOSPITAL_COMMUNITY): Payer: Medicare (Managed Care)

## 2024-07-23 ENCOUNTER — Ambulatory Visit: Admitting: Internal Medicine
